# Patient Record
Sex: Female | Born: 1940
Health system: Southern US, Community
[De-identification: ages and names within clinical notes are randomized; demographics above are authoritative.]

## PROBLEM LIST (undated history)

## (undated) DIAGNOSIS — N189 Chronic kidney disease, unspecified: Secondary | ICD-10-CM

## (undated) DIAGNOSIS — R011 Cardiac murmur, unspecified: Secondary | ICD-10-CM

## (undated) DIAGNOSIS — I5032 Chronic diastolic (congestive) heart failure: Secondary | ICD-10-CM

## (undated) DIAGNOSIS — I440 Atrioventricular block, first degree: Secondary | ICD-10-CM

## (undated) DIAGNOSIS — R001 Bradycardia, unspecified: Secondary | ICD-10-CM

## (undated) DIAGNOSIS — I10 Essential (primary) hypertension: Secondary | ICD-10-CM

## (undated) DIAGNOSIS — M1990 Unspecified osteoarthritis, unspecified site: Secondary | ICD-10-CM

## (undated) DIAGNOSIS — I480 Paroxysmal atrial fibrillation: Secondary | ICD-10-CM

## (undated) DIAGNOSIS — I495 Sick sinus syndrome: Secondary | ICD-10-CM

## (undated) DIAGNOSIS — T7840XA Allergy, unspecified, initial encounter: Secondary | ICD-10-CM

## (undated) DIAGNOSIS — I209 Angina pectoris, unspecified: Secondary | ICD-10-CM

## (undated) DIAGNOSIS — E785 Hyperlipidemia, unspecified: Secondary | ICD-10-CM

## (undated) DIAGNOSIS — Z950 Presence of cardiac pacemaker: Secondary | ICD-10-CM

## (undated) DIAGNOSIS — M810 Age-related osteoporosis without current pathological fracture: Secondary | ICD-10-CM

## (undated) DIAGNOSIS — K219 Gastro-esophageal reflux disease without esophagitis: Secondary | ICD-10-CM

## (undated) DIAGNOSIS — Z7901 Long term (current) use of anticoagulants: Secondary | ICD-10-CM

## (undated) DIAGNOSIS — I447 Left bundle-branch block, unspecified: Secondary | ICD-10-CM

## (undated) HISTORY — PX: TONSILLECTOMY AND ADENOIDECTOMY: SUR1326

## (undated) HISTORY — DX: Chronic kidney disease, unspecified: N18.9

## (undated) HISTORY — PX: BREAST LUMPECTOMY: SHX2

## (undated) HISTORY — DX: Left bundle-branch block, unspecified: I44.7

## (undated) HISTORY — DX: Age-related osteoporosis without current pathological fracture: M81.0

## (undated) HISTORY — DX: Essential (primary) hypertension: I10

## (undated) HISTORY — DX: Sick sinus syndrome: I49.5

## (undated) HISTORY — DX: Hyperlipidemia, unspecified: E78.5

## (undated) HISTORY — DX: Bradycardia, unspecified: R00.1

## (undated) HISTORY — PX: VAGINAL HYSTERECTOMY: SUR661

## (undated) HISTORY — DX: Long term (current) use of anticoagulants: Z79.01

## (undated) HISTORY — PX: BREAST EXCISIONAL BIOPSY: SUR124

## (undated) HISTORY — DX: Atrioventricular block, first degree: I44.0

## (undated) HISTORY — DX: Allergy, unspecified, initial encounter: T78.40XA

---

## 1969-01-08 HISTORY — PX: TUBAL LIGATION: SHX77

## 1969-01-08 HISTORY — PX: APPENDECTOMY: SHX54

## 1997-06-08 ENCOUNTER — Emergency Department (HOSPITAL_COMMUNITY): Admission: EM | Admit: 1997-06-08 | Discharge: 1997-06-08 | Payer: Self-pay | Admitting: Emergency Medicine

## 1997-08-03 ENCOUNTER — Other Ambulatory Visit: Admission: RE | Admit: 1997-08-03 | Discharge: 1997-08-03 | Payer: Self-pay | Admitting: Obstetrics and Gynecology

## 1999-02-23 ENCOUNTER — Other Ambulatory Visit: Admission: RE | Admit: 1999-02-23 | Discharge: 1999-02-23 | Payer: Self-pay | Admitting: Obstetrics and Gynecology

## 2000-06-12 ENCOUNTER — Other Ambulatory Visit: Admission: RE | Admit: 2000-06-12 | Discharge: 2000-06-12 | Payer: Self-pay | Admitting: Obstetrics and Gynecology

## 2000-11-09 ENCOUNTER — Emergency Department (HOSPITAL_COMMUNITY): Admission: EM | Admit: 2000-11-09 | Discharge: 2000-11-09 | Payer: Self-pay | Admitting: Emergency Medicine

## 2000-11-15 ENCOUNTER — Emergency Department (HOSPITAL_COMMUNITY): Admission: EM | Admit: 2000-11-15 | Discharge: 2000-11-15 | Payer: Self-pay | Admitting: Emergency Medicine

## 2001-10-10 ENCOUNTER — Other Ambulatory Visit: Admission: RE | Admit: 2001-10-10 | Discharge: 2001-10-10 | Payer: Self-pay | Admitting: Obstetrics and Gynecology

## 2003-12-27 ENCOUNTER — Encounter: Admission: RE | Admit: 2003-12-27 | Discharge: 2003-12-27 | Payer: Self-pay | Admitting: Obstetrics and Gynecology

## 2004-11-10 ENCOUNTER — Emergency Department (HOSPITAL_COMMUNITY): Admission: EM | Admit: 2004-11-10 | Discharge: 2004-11-10 | Payer: Self-pay | Admitting: Emergency Medicine

## 2005-02-01 ENCOUNTER — Encounter: Admission: RE | Admit: 2005-02-01 | Discharge: 2005-02-01 | Payer: Self-pay | Admitting: Obstetrics and Gynecology

## 2005-02-22 ENCOUNTER — Encounter: Admission: RE | Admit: 2005-02-22 | Discharge: 2005-02-22 | Payer: Self-pay | Admitting: Obstetrics and Gynecology

## 2006-06-06 ENCOUNTER — Encounter: Admission: RE | Admit: 2006-06-06 | Discharge: 2006-06-06 | Payer: Self-pay | Admitting: Obstetrics and Gynecology

## 2007-07-04 ENCOUNTER — Encounter: Admission: RE | Admit: 2007-07-04 | Discharge: 2007-07-04 | Payer: Self-pay | Admitting: Obstetrics and Gynecology

## 2007-07-21 ENCOUNTER — Encounter: Admission: RE | Admit: 2007-07-21 | Discharge: 2007-07-21 | Payer: Self-pay | Admitting: Obstetrics and Gynecology

## 2008-07-09 ENCOUNTER — Encounter: Admission: RE | Admit: 2008-07-09 | Discharge: 2008-07-09 | Payer: Self-pay | Admitting: Obstetrics and Gynecology

## 2009-07-12 ENCOUNTER — Encounter: Admission: RE | Admit: 2009-07-12 | Discharge: 2009-07-12 | Payer: Self-pay | Admitting: Obstetrics and Gynecology

## 2009-08-05 HISTORY — PX: NM MYOCAR PERF WALL MOTION: HXRAD629

## 2010-01-29 ENCOUNTER — Encounter: Payer: Self-pay | Admitting: Obstetrics and Gynecology

## 2010-01-30 ENCOUNTER — Encounter: Payer: Self-pay | Admitting: Obstetrics and Gynecology

## 2010-07-31 ENCOUNTER — Other Ambulatory Visit: Payer: Self-pay | Admitting: Obstetrics and Gynecology

## 2010-07-31 DIAGNOSIS — Z1231 Encounter for screening mammogram for malignant neoplasm of breast: Secondary | ICD-10-CM

## 2010-08-02 ENCOUNTER — Ambulatory Visit
Admission: RE | Admit: 2010-08-02 | Discharge: 2010-08-02 | Disposition: A | Discharge status: Home / Self Care | Payer: Federal, State, Local not specified - PPO | Source: Ambulatory Visit | Attending: Obstetrics and Gynecology | Admitting: Obstetrics and Gynecology

## 2010-08-02 DIAGNOSIS — Z1231 Encounter for screening mammogram for malignant neoplasm of breast: Secondary | ICD-10-CM

## 2011-07-26 ENCOUNTER — Other Ambulatory Visit: Payer: Self-pay | Admitting: Obstetrics and Gynecology

## 2011-07-26 DIAGNOSIS — Z1231 Encounter for screening mammogram for malignant neoplasm of breast: Secondary | ICD-10-CM

## 2011-08-06 ENCOUNTER — Ambulatory Visit
Admission: RE | Admit: 2011-08-06 | Discharge: 2011-08-06 | Disposition: A | Discharge status: Home / Self Care | Payer: Federal, State, Local not specified - PPO | Source: Ambulatory Visit | Attending: Obstetrics and Gynecology | Admitting: Obstetrics and Gynecology

## 2011-08-06 DIAGNOSIS — Z1231 Encounter for screening mammogram for malignant neoplasm of breast: Secondary | ICD-10-CM

## 2012-05-02 ENCOUNTER — Telehealth (HOSPITAL_COMMUNITY): Payer: Self-pay | Admitting: *Deleted

## 2012-05-09 ENCOUNTER — Other Ambulatory Visit (HOSPITAL_COMMUNITY): Payer: Self-pay | Admitting: Cardiovascular Disease

## 2012-05-09 DIAGNOSIS — I4891 Unspecified atrial fibrillation: Secondary | ICD-10-CM

## 2012-05-13 ENCOUNTER — Ambulatory Visit (HOSPITAL_COMMUNITY)
Admission: RE | Admit: 2012-05-13 | Discharge: 2012-05-13 | Disposition: A | Discharge status: Home / Self Care | Payer: Medicare Other | Source: Ambulatory Visit | Attending: Cardiovascular Disease | Admitting: Cardiovascular Disease

## 2012-05-13 DIAGNOSIS — I4891 Unspecified atrial fibrillation: Secondary | ICD-10-CM

## 2012-05-13 NOTE — Progress Notes (Signed)
2D Echo Performed 05/13/2012    Martin Belling, RCS  

## 2012-05-27 ENCOUNTER — Other Ambulatory Visit: Payer: Self-pay | Admitting: Cardiovascular Disease

## 2012-05-27 ENCOUNTER — Encounter: Payer: Self-pay | Admitting: Cardiovascular Disease

## 2012-05-27 LAB — LIPID PANEL
Cholesterol: 169 mg/dL (ref 0–200)
HDL: 57 mg/dL (ref >39)
LDL Cholesterol: 88 mg/dL (ref 0–99)
Total CHOL/HDL Ratio: 3 Ratio
Triglycerides: 121 mg/dL (ref <150)
VLDL: 24 mg/dL (ref 0–40)

## 2012-05-27 LAB — CBC WITH DIFFERENTIAL/PLATELET
Basophils Absolute: 0 10*3/uL (ref 0.0–0.1)
Basophils Relative: 0 % (ref 0–1)
Eosinophils Absolute: 0.2 10*3/uL (ref 0.0–0.7)
Eosinophils Relative: 3 % (ref 0–5)
HCT: 40.4 % (ref 36.0–46.0)
Hemoglobin: 13.7 g/dL (ref 12.0–15.0)
Lymphocytes Relative: 32 % (ref 12–46)
Lymphs Abs: 2.1 10*3/uL (ref 0.7–4.0)
MCH: 31.1 pg (ref 26.0–34.0)
MCHC: 33.9 g/dL (ref 30.0–36.0)
MCV: 91.8 fL (ref 78.0–100.0)
Monocytes Absolute: 0.6 10*3/uL (ref 0.1–1.0)
Monocytes Relative: 10 % (ref 3–12)
Neutro Abs: 3.6 10*3/uL (ref 1.7–7.7)
Neutrophils Relative %: 55 % (ref 43–77)
Platelets: 274 10*3/uL (ref 150–400)
RBC: 4.4 MIL/uL (ref 3.87–5.11)
RDW: 13.8 % (ref 11.5–15.5)
WBC: 6.5 10*3/uL (ref 4.0–10.5)

## 2012-05-27 LAB — COMPREHENSIVE METABOLIC PANEL
ALT: 37 U/L — ABNORMAL HIGH (ref 0–35)
AST: 31 U/L (ref 0–37)
Albumin: 4.5 g/dL (ref 3.5–5.2)
Alkaline Phosphatase: 123 U/L — ABNORMAL HIGH (ref 39–117)
BUN: 20 mg/dL (ref 6–23)
CO2: 29 mEq/L (ref 19–32)
Calcium: 10.1 mg/dL (ref 8.4–10.5)
Chloride: 103 mEq/L (ref 96–112)
Creat: 1.1 mg/dL (ref 0.50–1.10)
Glucose, Bld: 77 mg/dL (ref 70–99)
Potassium: 4.6 mEq/L (ref 3.5–5.3)
Sodium: 141 mEq/L (ref 135–145)
Total Bilirubin: 0.5 mg/dL (ref 0.3–1.2)
Total Protein: 6.7 g/dL (ref 6.0–8.3)

## 2012-07-09 ENCOUNTER — Other Ambulatory Visit: Payer: Self-pay

## 2012-07-09 DIAGNOSIS — Z1231 Encounter for screening mammogram for malignant neoplasm of breast: Secondary | ICD-10-CM

## 2012-08-06 ENCOUNTER — Ambulatory Visit
Admission: RE | Admit: 2012-08-06 | Discharge: 2012-08-06 | Disposition: A | Discharge status: Home / Self Care | Payer: Medicare Other | Source: Ambulatory Visit

## 2012-08-06 DIAGNOSIS — Z1231 Encounter for screening mammogram for malignant neoplasm of breast: Secondary | ICD-10-CM

## 2012-11-27 ENCOUNTER — Telehealth: Payer: Self-pay | Admitting: Cardiovascular Disease

## 2012-11-27 ENCOUNTER — Other Ambulatory Visit: Payer: Self-pay

## 2012-11-27 MED ORDER — FLECAINIDE ACETATE 100 MG PO TABS: 100.0000 mg | ORAL_TABLET | Freq: Two times a day (BID) | ORAL | Status: DC

## 2012-11-27 NOTE — Telephone Encounter (Signed)
Returned call and pt verified x 2.  Need refill on flecainide.  Stated she uses [facility].  Refill(s) sent to pharmacy.  Appt scheduled for 12.12.14 at 8:30am w/ Dr. Royann Shivers (per pt request) as pt due for 6 mos f/u and is switching from Dr. Alanda Amass.  Pt will get refills for other meds at visit.  Pt verbalized understanding and agreed w/ plan.

## 2012-11-27 NOTE — Telephone Encounter (Signed)
Bystolic 10 mg tablets are currently on back order. Pharmacy - [facility] requesting to use Bystolic 5 mg adjusted to the same dose. Okay per Phillips Hay, PharmD. Request faxed back to pharmacy.

## 2012-11-27 NOTE — Telephone Encounter (Signed)
Message per answering service. She gets her medicine through the mail,needs you to contact them and give them new doctor information.

## 2012-12-18 ENCOUNTER — Encounter: Payer: Self-pay | Admitting: *Deleted

## 2012-12-19 ENCOUNTER — Ambulatory Visit: Payer: Medicare Other | Admitting: Cardiovascular Disease

## 2012-12-23 ENCOUNTER — Encounter: Payer: Self-pay | Admitting: Cardiovascular Disease

## 2012-12-23 ENCOUNTER — Ambulatory Visit (INDEPENDENT_AMBULATORY_CARE_PROVIDER_SITE_OTHER): Payer: Medicare Other | Admitting: Cardiovascular Disease

## 2012-12-23 ENCOUNTER — Encounter (HOSPITAL_COMMUNITY): Payer: Self-pay | Admitting: Pharmacy Technician

## 2012-12-23 VITALS — BP 138/80 | HR 48 | Resp 16 | Ht 62.0 in | Wt 154.7 lb

## 2012-12-23 DIAGNOSIS — Z79899 Other long term (current) drug therapy: Secondary | ICD-10-CM

## 2012-12-23 DIAGNOSIS — I447 Left bundle-branch block, unspecified: Secondary | ICD-10-CM

## 2012-12-23 DIAGNOSIS — D689 Coagulation defect, unspecified: Secondary | ICD-10-CM

## 2012-12-23 DIAGNOSIS — I4891 Unspecified atrial fibrillation: Secondary | ICD-10-CM

## 2012-12-23 DIAGNOSIS — I480 Paroxysmal atrial fibrillation: Secondary | ICD-10-CM

## 2012-12-23 DIAGNOSIS — I10 Essential (primary) hypertension: Secondary | ICD-10-CM

## 2012-12-23 DIAGNOSIS — I440 Atrioventricular block, first degree: Secondary | ICD-10-CM

## 2012-12-23 DIAGNOSIS — I498 Other specified cardiac arrhythmias: Secondary | ICD-10-CM

## 2012-12-23 DIAGNOSIS — E785 Hyperlipidemia, unspecified: Secondary | ICD-10-CM

## 2012-12-23 DIAGNOSIS — R001 Bradycardia, unspecified: Secondary | ICD-10-CM

## 2012-12-23 DIAGNOSIS — R5381 Other malaise: Secondary | ICD-10-CM

## 2012-12-23 LAB — CBC
HCT: 40.9 % (ref 36.0–46.0)
Hemoglobin: 13.7 g/dL (ref 12.0–15.0)
MCH: 31.2 pg (ref 26.0–34.0)
MCHC: 33.5 g/dL (ref 30.0–36.0)
MCV: 93.2 fL (ref 78.0–100.0)
Platelets: 315 10*3/uL (ref 150–400)
RBC: 4.39 MIL/uL (ref 3.87–5.11)
WBC: 6.4 10*3/uL (ref 4.0–10.5)

## 2012-12-23 NOTE — Patient Instructions (Signed)
Your physician has recommended that you have a pacemaker inserted.  (MEDTRONIC DUAL CHAMBER). A pacemaker is a small device that is placed under the skin of your chest or abdomen to help control abnormal heart rhythms. This device uses electrical pulses to prompt the heart to beat at a normal rate. Pacemakers are used to treat heart rhythms that are too slow. Wire (leads) are attached to the pacemaker that goes into the chambers of you heart. This is done in the hospital and usually requires and overnight stay. Please see the instruction sheet given to you today for more information.  You will need to have lab work done within the week  prior to this procedure.

## 2012-12-23 NOTE — Assessment & Plan Note (Signed)
Good control

## 2012-12-23 NOTE — Progress Notes (Signed)
Patient ID: Monica Harvey, female   DOB: 1940-06-23, 72 y.o.   MRN: [id number]      Reason for office visit Paroxysmal atrial fibrillation, fatigue  I met Monica Harvey once before when I performed a pacemaker generator change out on her son Monica Harvey. She is here to establish new cardiology followup after Dr. Susa Griffins is retirement  Monica Harvey complains of worsening fatigue and reduction in ability to perform day-to-day activities. She now has this done exertion, functional class II. She denies dizziness or syncope. She finds that she does have a tendency to lean over to one side and lose her balance. She has not had problems with exertional angina. She has a long-standing history of paroxysmal atrial fibrillation that has responded very well to treatment with flecainide. She has occasional isolated palpitations but has not had any sustained arrhythmia since her last appointment with Dr. Alanda Amass. She takes a beta blocker for both ventricular rate control and hypertension.  She has never had problems with coronary disease or congestive heart failure. She does have treated hyperlipidemia and hypertension and had a normal nuclear stress test in 2011 (left bundle branch block related artifact, no reversible ischemia). By nuclear scintigraphy her estimated ejection fraction is 59%, by echocardiography her EF is 45-50%, the reduction being attributable to left bundle branch block related asynchrony.   Allergies  Allergen Reactions  . Codeine     Current Outpatient Prescriptions  Medication Sig Dispense Refill  . amitriptyline (ELAVIL) 25 MG tablet Take 25 mg by mouth at bedtime.      Marland Kitchen aspirin 162 MG EC tablet Take 162 mg by mouth daily.      . flecainide (TAMBOCOR) 100 MG tablet Take 1 tablet (100 mg total) by mouth 2 (two) times daily.  180 tablet  3  . nebivolol (BYSTOLIC) 10 MG tablet Take 10 mg by mouth daily.      . Pitavastatin Calcium (LIVALO) 1 MG TABS Take 1 tablet by mouth  daily.      . valsartan (DIOVAN) 160 MG tablet Take 160 mg by mouth daily.       No current facility-administered medications for this visit.    Past Medical History  Diagnosis Date  . SSS (sick sinus syndrome)     H/O  . Atrial fibrillation     remote H/O  . LBBB (left bundle branch block)   . Hyperlipidemia   . Symptomatic sinus bradycardia 12/23/2012  . First degree atrioventricular block 12/23/2012  . HTN (hypertension) 12/23/2012    Past Surgical History  Procedure Laterality Date  . Nm myocar perf wall motion  08/05/2009    small to mod. reversible anteroseptal,septal,& apical perfusion defect. EF 59%.    Family History  Problem Relation Age of Onset  . Heart attack Mother   . Stroke Sister     History   Social History  . Marital Status: Married    Spouse Name: N/A    Number of Children: N/A  . Years of Education: N/A   Occupational History  . Not on file.   Social History Main Topics  . Smoking status: Never Smoker   . Smokeless tobacco: Not on file  . Alcohol Use: No  . Drug Use: No  . Sexual Activity: Not on file   Other Topics Concern  . Not on file   Social History Narrative  . No narrative on file    Review of systems: The patient specifically denies any chest pain at rest  or with exertion, dyspnea at rest, orthopnea, paroxysmal nocturnal dyspnea, syncope, palpitations, focal neurological deficits, intermittent claudication, lower extremity edema, unexplained weight gain, cough, hemoptysis or wheezing.  The patient also denies abdominal pain, nausea, vomiting, dysphagia, diarrhea, constipation, polyuria, polydipsia, dysuria, hematuria, frequency, urgency, abnormal bleeding or bruising, fever, chills, unexpected weight changes, mood swings, change in skin or hair texture, change in voice quality, auditory or visual problems, allergic reactions or rashes, new musculoskeletal complaints other than usual "aches and pains".   PHYSICAL EXAM BP  138/80  Pulse 48  Resp 16  Ht 5\' 2"  (1.575 m)  Wt 154 lb 11.2 oz (70.171 kg)  BMI 28.29 kg/m2  General: Alert, oriented x3, no distress Head: no evidence of trauma, PERRL, EOMI, no exophtalmos or lid lag, no myxedema, no xanthelasma; normal ears, nose and oropharynx Neck: normal jugular venous pulsations and no hepatojugular reflux; brisk carotid pulses without delay and no carotid bruits Chest: clear to auscultation, no signs of consolidation by percussion or palpation, normal fremitus, symmetrical and full respiratory excursions Cardiovascular: normal position and quality of the apical impulse, regular rhythm, normal first and paradoxically split second heart sounds, no murmurs, rubs or gallops Abdomen: no tenderness or distention, no masses by palpation, no abnormal pulsatility or arterial bruits, normal bowel sounds, no hepatosplenomegaly Extremities: no clubbing, cyanosis or edema; 2+ radial, ulnar and brachial pulses bilaterally; 2+ right femoral, posterior tibial and dorsalis pedis pulses; 2+ left femoral, posterior tibial and dorsalis pedis pulses; no subclavian or femoral bruits Neurological: grossly nonfocal   EKG: Marked sinus bradycardia, first degree AV block, left bundle branch block  Lipid Panel     Component Value Date/Time   CHOL 169 05/27/2012 0908   TRIG 121 05/27/2012 0908   HDL 57 05/27/2012 0908   CHOLHDL 3.0 05/27/2012 0908   VLDL 24 05/27/2012 0908   LDLCALC 88 05/27/2012 0908    BMET    Component Value Date/Time   NA 141 05/27/2012 0908   K 4.6 05/27/2012 0908   CL 103 05/27/2012 0908   CO2 29 05/27/2012 0908   GLUCOSE 77 05/27/2012 0908   BUN 20 05/27/2012 0908   CREATININE 1.10 05/27/2012 0908   CALCIUM 10.1 05/27/2012 0908     ASSESSMENT AND PLAN Symptomatic sinus bradycardia She has never had syncope, but is experiencing worsening dyspnea on exertion and sometimes extreme fatigue. She needs to continue taking beta blockers while she takes flecainide for  atrial fibrillation. She will benefit from implantation of a dual-chamber permanent pacemaker. She does not have clear indications for MRI in the future, but may benefit from the atrial tachycardia therapies provided by a Medtronic Advisa device. She also has evidence of AV conduction disease with left bundle branch block and first degree AV block, at risk of developing complete heart block. At this point in time she does not have overt congestive heart failure and has at most minimally depressed left ventricular systolic function. Resynchronization pacing does not appear to be indicated at this time. This procedure has been fully reviewed with the patient and informed consent has been obtained. She is familiar with pacemakers and the implantation procedure from her son Channing Mutters "Monica Harvey" Stone.   PAF (paroxysmal atrial fibrillation) Flecainide has provided good at arrhythmia suppression, but she is getting older and has at least one risk factor for vascular disease (hyperlipidemia) it is important to periodically reassess for coronary disease. She has not had a functional study since 2011. We'll probably wait for another year or 2  before reassessing, but I instructed her to call me right away if should have any type of chest discomfort, especially if it occurs with activity.  HTN (hypertension) Good control  Hyperlipidemia All parameters are within the desirable range on a very low dose of pitavastatin.   Orders Placed This Encounter  Procedures  . CBC  . Comprehensive metabolic panel  . INR/PT  . PTT  . EKG 12-Lead  . PERMANENT PACEMAKER INSERTION   Junious Silk, MD, [facility] [phone] office [phone] pager

## 2012-12-23 NOTE — Assessment & Plan Note (Signed)
Flecainide has provided good at arrhythmia suppression, but she is getting older and has at least one risk factor for vascular disease (hyperlipidemia) it is important to periodically reassess for coronary disease. She has not had a functional study since 2011. We'll probably wait for another year or 2 before reassessing, but I instructed her to call me right away if should have any type of chest discomfort, especially if it occurs with activity.

## 2012-12-23 NOTE — Assessment & Plan Note (Signed)
All parameters are within the desirable range on a very low dose of pitavastatin.

## 2012-12-23 NOTE — Assessment & Plan Note (Addendum)
She has never had syncope, but is experiencing worsening dyspnea on exertion and sometimes extreme fatigue. She needs to continue taking beta blockers while she takes flecainide for atrial fibrillation. She will benefit from implantation of a dual-chamber permanent pacemaker. She does not have clear indications for MRI in the future, but may benefit from the atrial tachycardia therapies provided by a Medtronic Advisa device. She also has evidence of AV conduction disease with left bundle branch block and first degree AV block, at risk of developing complete heart block. At this point in time she does not have overt congestive heart failure and has at most minimally depressed left ventricular systolic function. Resynchronization pacing does not appear to be indicated at this time. This procedure has been fully reviewed with the patient and informed consent has been obtained. She is familiar with pacemakers and the implantation procedure from her son Monica Harvey.

## 2012-12-24 ENCOUNTER — Other Ambulatory Visit: Payer: Self-pay | Admitting: *Deleted

## 2012-12-24 DIAGNOSIS — R001 Bradycardia, unspecified: Secondary | ICD-10-CM

## 2012-12-24 LAB — COMPREHENSIVE METABOLIC PANEL
ALT: 33 U/L (ref 0–35)
Albumin: 4.5 g/dL (ref 3.5–5.2)
Alkaline Phosphatase: 119 U/L — ABNORMAL HIGH (ref 39–117)
BUN: 13 mg/dL (ref 6–23)
CO2: 26 mEq/L (ref 19–32)
Calcium: 10.3 mg/dL (ref 8.4–10.5)
Chloride: 104 mEq/L (ref 96–112)
Creat: 1.12 mg/dL — ABNORMAL HIGH (ref 0.50–1.10)
Glucose, Bld: 94 mg/dL (ref 70–99)
Potassium: 4.6 mEq/L (ref 3.5–5.3)
Sodium: 143 mEq/L (ref 135–145)
Total Bilirubin: 0.4 mg/dL (ref 0.3–1.2)

## 2012-12-24 LAB — APTT: aPTT: 27 seconds (ref 24–37)

## 2012-12-24 LAB — PROTIME-INR: INR: 0.88 (ref <1.50)

## 2012-12-25 MED ORDER — SODIUM CHLORIDE 0.9 % IR SOLN
80.0000 mg | Status: DC
  Filled 2012-12-25: qty 2

## 2012-12-25 MED ORDER — CEFAZOLIN SODIUM-DEXTROSE 2-3 GM-% IV SOLR
2.0000 g | INTRAVENOUS | Status: DC
  Filled 2012-12-25: qty 50

## 2012-12-25 MED ORDER — SODIUM CHLORIDE 0.9 % IV SOLN
INTRAVENOUS | Status: DC
  Administered 2012-12-26: 50 mL/h via INTRAVENOUS

## 2012-12-25 MED ORDER — SODIUM CHLORIDE 0.9 % IJ SOLN: 3.0000 mL | INTRAMUSCULAR | Status: DC | PRN

## 2012-12-26 ENCOUNTER — Encounter (HOSPITAL_COMMUNITY): Payer: Self-pay | Admitting: *Deleted

## 2012-12-26 ENCOUNTER — Encounter (HOSPITAL_COMMUNITY)
Admission: RE | Disposition: A | Discharge status: Home / Self Care | Payer: Self-pay | Source: Ambulatory Visit | Attending: Cardiovascular Disease

## 2012-12-26 ENCOUNTER — Ambulatory Visit (HOSPITAL_COMMUNITY)
Admission: RE | Admit: 2012-12-26 | Discharge: 2012-12-27 | Disposition: A | Discharge status: Home / Self Care | Payer: Medicare Other | Source: Ambulatory Visit | Attending: Cardiovascular Disease | Admitting: Cardiovascular Disease

## 2012-12-26 DIAGNOSIS — I447 Left bundle-branch block, unspecified: Secondary | ICD-10-CM | POA: Diagnosis present

## 2012-12-26 DIAGNOSIS — R001 Bradycardia, unspecified: Secondary | ICD-10-CM | POA: Diagnosis present

## 2012-12-26 DIAGNOSIS — I10 Essential (primary) hypertension: Secondary | ICD-10-CM | POA: Diagnosis present

## 2012-12-26 DIAGNOSIS — I495 Sick sinus syndrome: Secondary | ICD-10-CM

## 2012-12-26 DIAGNOSIS — I498 Other specified cardiac arrhythmias: Secondary | ICD-10-CM

## 2012-12-26 DIAGNOSIS — Z7982 Long term (current) use of aspirin: Secondary | ICD-10-CM | POA: Insufficient documentation

## 2012-12-26 DIAGNOSIS — I440 Atrioventricular block, first degree: Secondary | ICD-10-CM | POA: Diagnosis present

## 2012-12-26 DIAGNOSIS — I4891 Unspecified atrial fibrillation: Secondary | ICD-10-CM | POA: Insufficient documentation

## 2012-12-26 DIAGNOSIS — Z950 Presence of cardiac pacemaker: Secondary | ICD-10-CM | POA: Diagnosis present

## 2012-12-26 DIAGNOSIS — E785 Hyperlipidemia, unspecified: Secondary | ICD-10-CM | POA: Diagnosis present

## 2012-12-26 DIAGNOSIS — I480 Paroxysmal atrial fibrillation: Secondary | ICD-10-CM | POA: Diagnosis present

## 2012-12-26 HISTORY — PX: INSERT / REPLACE / REMOVE PACEMAKER: SUR710

## 2012-12-26 HISTORY — PX: PERMANENT PACEMAKER INSERTION: SHX5480

## 2012-12-26 LAB — SURGICAL PCR SCREEN
MRSA, PCR: NEGATIVE
Staphylococcus aureus: NEGATIVE

## 2012-12-26 SURGERY — PERMANENT PACEMAKER INSERTION
Anesthesia: LOCAL

## 2012-12-26 MED ORDER — ASPIRIN EC 81 MG PO TBEC
162.0000 mg | DELAYED_RELEASE_TABLET | Freq: Every day | ORAL | Status: DC
  Administered 2012-12-26 – 2012-12-27 (×2): 162 mg via ORAL
  Filled 2012-12-26 (×2): qty 2

## 2012-12-26 MED ORDER — LIDOCAINE HCL (PF) 1 % IJ SOLN
INTRAMUSCULAR | Status: AC
  Filled 2012-12-26: qty 60

## 2012-12-26 MED ORDER — IRBESARTAN 150 MG PO TABS
150.0000 mg | ORAL_TABLET | Freq: Every day | ORAL | Status: DC
  Administered 2012-12-27: 150 mg via ORAL
  Filled 2012-12-26 (×2): qty 1

## 2012-12-26 MED ORDER — MIDAZOLAM HCL 2 MG/2ML IJ SOLN
INTRAMUSCULAR | Status: AC
  Filled 2012-12-26: qty 2

## 2012-12-26 MED ORDER — HEPARIN (PORCINE) IN NACL 2-0.9 UNIT/ML-% IJ SOLN
INTRAMUSCULAR | Status: AC
  Filled 2012-12-26: qty 500

## 2012-12-26 MED ORDER — CEFAZOLIN SODIUM 1-5 GM-% IV SOLN
1.0000 g | Freq: Four times a day (QID) | INTRAVENOUS | Status: AC
  Administered 2012-12-26 – 2012-12-27 (×3): 1 g via INTRAVENOUS
  Filled 2012-12-26 (×3): qty 50

## 2012-12-26 MED ORDER — FENTANYL CITRATE 0.05 MG/ML IJ SOLN
INTRAMUSCULAR | Status: AC
  Filled 2012-12-26: qty 2

## 2012-12-26 MED ORDER — NEBIVOLOL HCL 10 MG PO TABS
10.0000 mg | ORAL_TABLET | Freq: Every day | ORAL | Status: DC
  Administered 2012-12-26 – 2012-12-27 (×2): 10 mg via ORAL
  Filled 2012-12-26 (×2): qty 1

## 2012-12-26 MED ORDER — ONDANSETRON HCL 4 MG/2ML IJ SOLN: 4.0000 mg | Freq: Four times a day (QID) | INTRAMUSCULAR | Status: DC | PRN

## 2012-12-26 MED ORDER — SODIUM CHLORIDE 0.9 % IV SOLN
INTRAVENOUS | Status: AC
  Administered 2012-12-26: 13:00:00 via INTRAVENOUS

## 2012-12-26 MED ORDER — TRAMADOL-ACETAMINOPHEN 37.5-325 MG PO TABS
1.0000 | ORAL_TABLET | Freq: Four times a day (QID) | ORAL | Status: DC | PRN
  Administered 2012-12-26 (×2): 2 via ORAL
  Filled 2012-12-26 (×2): qty 2

## 2012-12-26 MED ORDER — AMITRIPTYLINE HCL 25 MG PO TABS
25.0000 mg | ORAL_TABLET | Freq: Every day | ORAL | Status: DC
  Administered 2012-12-26: 25 mg via ORAL
  Filled 2012-12-26 (×2): qty 1

## 2012-12-26 MED ORDER — MUPIROCIN 2 % EX OINT
TOPICAL_OINTMENT | Freq: Two times a day (BID) | CUTANEOUS | Status: DC
  Administered 2012-12-26: 1 via NASAL
  Filled 2012-12-26 (×2): qty 22

## 2012-12-26 MED ORDER — ACETAMINOPHEN 325 MG PO TABS: 325.0000 mg | ORAL_TABLET | ORAL | Status: DC | PRN

## 2012-12-26 NOTE — CV Procedure (Signed)
Procedure report  Procedure performed:  1. Implantation of new dual chamber permanent pacemaker 2. Fluoroscopy 3. Light sedation  Reason for procedure: Symptomatic bradycardia due to: Sinus node dysfunction Tachycardia-bradycardia syndrome Bradycardia due to necessary medications Paroxysmal atrial fibrillation  Procedure performed by: Thurmon Fair, MD  Complications: None  Estimated blood loss: <10 mL  Medications administered during procedure: Ancef 1 g intravenously Lidocaine 1% 30 mL locally,  Fentanyl 75 mcg intravenously Versed 3 mg intravenously  Device details: Generator Medtronic Advisa model [id number] serial number [id number] H Right atrial lead Medtronic [id number] serial number [id number] Right ventricular lead Medtronic [id number] serial number [id number]  Procedure details:  After the risks and benefits of the procedure were discussed the patient provided informed consent and was brought to the cardiac cath lab in the fasting state. The patient was prepped and draped in usual sterile fashion. Local anesthesia with 1% lidocaine was administered to to the left infraclavicular area. A 5-6 cm horizontal incision was made parallel with and 2-3 cm caudal to the left clavicle. Using electrocautery and blunt dissection a prepectoral pocket was created down to the level of the pectoralis major muscle fascia. The pocket was carefully inspected for hemostasis. An antibiotic-soaked sponge was placed in the pocket.  Under fluoroscopic guidance and using the modified Seldinger technique 2 separate venipunctures were performed to access the left subclavian vein. No difficulty was encountered accessing the vein.  Two J-tip guidewires were subsequently exchanged for two 7 French safe sheaths.  Under fluoroscopic guidance the ventricular lead was advanced to level of the mid to apical right ventricular septum and thet active-fixation helix was deployed. Prominent current of injury was  seen. Satisfactory pacing and sensing parameters were recorded. There was no evidence of diaphragmatic stimulation at maximum device output. The safe sheath was peeled away and the lead was secured in place with 2-0 silk.  In similar fashion the right atrial lead was advanced to the level of the atrial appendage. The active-fixation helix was deployed. There was prominent current of injury. Satisfactory  sensing parameters were recorded, but there was a lot of difficulty finding a location with good pacing thresholds. The lead was deployed in several different locations and despite allowing time for initial injury to dissipate, there was still a fairly high pacing threshold .There was no evidence of diaphragmatic stimulation with pacing at maximum device output. The safe sheath was peeled away and the lead was secured in place with 2-0 silk.  The antibiotic-soaked sponge was removed from the pocket. The pocket was flushed with copious amounts of antibiotic solution. Reinspection showed excellent hemostasis..  The ventricular lead was connected to the generator and appropriate ventricular pacing was seen. Subsequently the atrial lead was also connected. Repeat testing of the lead parameters later showed excellent values.  The entire system was then carefully inserted in the pocket with care been taking that the leads and device assumed a comfortable position without pressure on the incision. Great care was taken that the leads be located deep to the generator. The pocket was then closed in layers using 2 layers of 2-0 Vicryl and cutaneous staples, after which a sterile dressing was applied.  At the end of the procedure the following lead parameters were encountered:  Right atrial lead  sensed P waves 1.5 mV, impedance 597 ohms, threshold 2.25 V at 0.8 ms pulse width.  Right ventricular lead sensed R waves 10.5 mV, impedance 963 ohms, threshold 1.2 V at 0.5 ms pulse width.  Will discontinue flecainide  and if needed for AF prevention will prefer to use sotalol for better pacing thresholds.  Thurmon Fair, MD, [facility] [phone] office [phone] pager

## 2012-12-26 NOTE — H&P (View-Only) (Signed)
Patient ID: Monica Harvey, female   DOB: 04/19/1940, 72 y.o.   MRN: [id number]      Reason for office visit Paroxysmal atrial fibrillation, fatigue  I met Monica Harvey once before when I performed a pacemaker generator change out on her son Shane. She is here to establish new cardiology followup after Dr. Richard Weintraub is retirement  Monica Harvey complains of worsening fatigue and reduction in ability to perform day-to-day activities. She now has this done exertion, functional class II. She denies dizziness or syncope. She finds that she does have a tendency to lean over to one side and lose her balance. She has not had problems with exertional angina. She has a long-standing history of paroxysmal atrial fibrillation that has responded very well to treatment with flecainide. She has occasional isolated palpitations but has not had any sustained arrhythmia since her last appointment with Dr. Weintraub. She takes a beta blocker for both ventricular rate control and hypertension.  She has never had problems with coronary disease or congestive heart failure. She does have treated hyperlipidemia and hypertension and had a normal nuclear stress test in 2011 (left bundle branch block related artifact, no reversible ischemia). By nuclear scintigraphy her estimated ejection fraction is 59%, by echocardiography her EF is 45-50%, the reduction being attributable to left bundle branch block related asynchrony.   Allergies  Allergen Reactions  . Codeine     Current Outpatient Prescriptions  Medication Sig Dispense Refill  . amitriptyline (ELAVIL) 25 MG tablet Take 25 mg by mouth at bedtime.      . aspirin 162 MG EC tablet Take 162 mg by mouth daily.      . flecainide (TAMBOCOR) 100 MG tablet Take 1 tablet (100 mg total) by mouth 2 (two) times daily.  180 tablet  3  . nebivolol (BYSTOLIC) 10 MG tablet Take 10 mg by mouth daily.      . Pitavastatin Calcium (LIVALO) 1 MG TABS Take 1 tablet by mouth  daily.      . valsartan (DIOVAN) 160 MG tablet Take 160 mg by mouth daily.       No current facility-administered medications for this visit.    Past Medical History  Diagnosis Date  . SSS (sick sinus syndrome)     H/O  . Atrial fibrillation     remote H/O  . LBBB (left bundle branch block)   . Hyperlipidemia   . Symptomatic sinus bradycardia 12/23/2012  . First degree atrioventricular block 12/23/2012  . HTN (hypertension) 12/23/2012    Past Surgical History  Procedure Laterality Date  . Nm myocar perf wall motion  08/05/2009    small to mod. reversible anteroseptal,septal,& apical perfusion defect. EF 59%.    Family History  Problem Relation Age of Onset  . Heart attack Mother   . Stroke Sister     History   Social History  . Marital Status: Married    Spouse Name: N/A    Number of Children: N/A  . Years of Education: N/A   Occupational History  . Not on file.   Social History Main Topics  . Smoking status: Never Smoker   . Smokeless tobacco: Not on file  . Alcohol Use: No  . Drug Use: No  . Sexual Activity: Not on file   Other Topics Concern  . Not on file   Social History Narrative  . No narrative on file    Review of systems: The patient specifically denies any chest pain at rest   or with exertion, dyspnea at rest, orthopnea, paroxysmal nocturnal dyspnea, syncope, palpitations, focal neurological deficits, intermittent claudication, lower extremity edema, unexplained weight gain, cough, hemoptysis or wheezing.  The patient also denies abdominal pain, nausea, vomiting, dysphagia, diarrhea, constipation, polyuria, polydipsia, dysuria, hematuria, frequency, urgency, abnormal bleeding or bruising, fever, chills, unexpected weight changes, mood swings, change in skin or hair texture, change in voice quality, auditory or visual problems, allergic reactions or rashes, new musculoskeletal complaints other than usual "aches and pains".   PHYSICAL EXAM BP  138/80  Pulse 48  Resp 16  Ht 5' 2" (1.575 m)  Wt 154 lb 11.2 oz (70.171 kg)  BMI 28.29 kg/m2  General: Alert, oriented x3, no distress Head: no evidence of trauma, PERRL, EOMI, no exophtalmos or lid lag, no myxedema, no xanthelasma; normal ears, nose and oropharynx Neck: normal jugular venous pulsations and no hepatojugular reflux; brisk carotid pulses without delay and no carotid bruits Chest: clear to auscultation, no signs of consolidation by percussion or palpation, normal fremitus, symmetrical and full respiratory excursions Cardiovascular: normal position and quality of the apical impulse, regular rhythm, normal first and paradoxically split second heart sounds, no murmurs, rubs or gallops Abdomen: no tenderness or distention, no masses by palpation, no abnormal pulsatility or arterial bruits, normal bowel sounds, no hepatosplenomegaly Extremities: no clubbing, cyanosis or edema; 2+ radial, ulnar and brachial pulses bilaterally; 2+ right femoral, posterior tibial and dorsalis pedis pulses; 2+ left femoral, posterior tibial and dorsalis pedis pulses; no subclavian or femoral bruits Neurological: grossly nonfocal   EKG: Marked sinus bradycardia, first degree AV block, left bundle branch block  Lipid Panel     Component Value Date/Time   CHOL 169 05/27/2012 0908   TRIG 121 05/27/2012 0908   HDL 57 05/27/2012 0908   CHOLHDL 3.0 05/27/2012 0908   VLDL 24 05/27/2012 0908   LDLCALC 88 05/27/2012 0908    BMET    Component Value Date/Time   NA 141 05/27/2012 0908   K 4.6 05/27/2012 0908   CL 103 05/27/2012 0908   CO2 29 05/27/2012 0908   GLUCOSE 77 05/27/2012 0908   BUN 20 05/27/2012 0908   CREATININE 1.10 05/27/2012 0908   CALCIUM 10.1 05/27/2012 0908     ASSESSMENT AND PLAN Symptomatic sinus bradycardia She has never had syncope, but is experiencing worsening dyspnea on exertion and sometimes extreme fatigue. She needs to continue taking beta blockers while she takes flecainide for  atrial fibrillation. She will benefit from implantation of a dual-chamber permanent pacemaker. She does not have clear indications for MRI in the future, but may benefit from the atrial tachycardia therapies provided by a Medtronic Advisa device. She also has evidence of AV conduction disease with left bundle branch block and first degree AV block, at risk of developing complete heart block. At this point in time she does not have overt congestive heart failure and has at most minimally depressed left ventricular systolic function. Resynchronization pacing does not appear to be indicated at this time. This procedure has been fully reviewed with the patient and informed consent has been obtained. She is familiar with pacemakers and the implantation procedure from her son Roy "Shane" Stone.   PAF (paroxysmal atrial fibrillation) Flecainide has provided good at arrhythmia suppression, but she is getting older and has at least one risk factor for vascular disease (hyperlipidemia) it is important to periodically reassess for coronary disease. She has not had a functional study since 2011. We'll probably wait for another year or 2   before reassessing, but I instructed her to call me right away if should have any type of chest discomfort, especially if it occurs with activity.  HTN (hypertension) Good control  Hyperlipidemia All parameters are within the desirable range on a very low dose of pitavastatin.   Orders Placed This Encounter  Procedures  . CBC  . Comprehensive metabolic panel  . INR/PT  . PTT  . EKG 12-Lead  . PERMANENT PACEMAKER INSERTION   Bhavesh Vazquez  Emile Ringgenberg, MD, FACC [facility] [phone] office [phone] pager   

## 2012-12-26 NOTE — Interval H&P Note (Signed)
History and Physical Interval Note:  12/26/2012 9:06 AM  Monica Harvey  has presented today for surgery, with the diagnosis of BRADYCARDIA  The various methods of treatment have been discussed with the patient and family. After consideration of risks, benefits and other options for treatment, the patient has consented to  Procedure(s): PERMANENT PACEMAKER INSERTION (N/A) as a surgical intervention .  The patient's history has been reviewed, patient examined, no change in status, stable for surgery.  I have reviewed the patient's chart and labs.  Questions were answered to the patient's satisfaction.     Jarrell Armond

## 2012-12-27 ENCOUNTER — Ambulatory Visit (HOSPITAL_COMMUNITY): Payer: Medicare Other

## 2012-12-27 MED ORDER — YOU HAVE A PACEMAKER BOOK
Freq: Once | Status: AC
  Administered 2012-12-27: 09:00:00
  Filled 2012-12-27: qty 1

## 2012-12-27 MED ORDER — ACETAMINOPHEN 325 MG PO TABS: 325.0000 mg | ORAL_TABLET | ORAL | Status: DC | PRN

## 2012-12-27 NOTE — Discharge Summary (Signed)
Patient ID: Monica Harvey,  MRN: [id number], DOB/AGE: October 08, 1940 72 y.o.  Admit date: 12/26/2012 Discharge date: 12/27/2012  Primary Care Provider: [facility] Primary Cardiologist: Dr Royann Shivers  Discharge Diagnoses Principal Problem:   Symptomatic sinus bradycardia Active Problems:   Pacemaker- MDT implanted 12/26/12   PAF (paroxysmal atrial fibrillation)   HTN (hypertension)   LBBB (left bundle branch block)   First degree atrioventricular block   Hyperlipidemia    Procedures: MDT PTVDP implant 12/26/12   Hospital Course:  72 y/o previously followed by Dr Alanda Amass with a history of PAF and LBBB. She has been in NSR on Flecainide. She has no history of CAD or MI. Her last EF was 45-50% by echo 5/14. She was admitted for an elective pacemaker implant 12/26/12. See Dr Croitoru's office note from 12/23/12. She tolerated this well. CXR shows no PTX. She is discharged, off Flecainide and on ASA 162 mg, on 12/27/12/ She'll need a pacer site check in 7-10 days.  Discharge Vitals:  Blood pressure 113/44, pulse 57, temperature 97.8 F (36.6 C), temperature source Oral, resp. rate 18, height 5\' 2"  (1.575 m), weight 149 lb 11 oz (67.899 kg), SpO2 96.00%.    Labs: Results for orders placed during the hospital encounter of 12/26/12 (from the past 48 hour(s))  SURGICAL PCR SCREEN     Status: None   Collection Time    12/26/12  7:10 AM      Result Value Range   MRSA, PCR NEGATIVE  NEGATIVE   Staphylococcus aureus NEGATIVE  NEGATIVE   Comment:            The Xpert SA Assay (FDA     approved for NASAL specimens     in patients over 89 years of age),     is one component of     a comprehensive surveillance     program.  Test performance has     been validated by [employer] for patients greater     than or equal to 53 year old.     It is not intended     to diagnose infection nor to     guide or monitor treatment.    Disposition:  Follow-up Information   Follow up with Corine Shelter K, PA-C. (office will call you)    Specialty:  Cardiology   Contact information:   [address] [place] [address] [phone]       Discharge Medications:    Medication List    STOP taking these medications       flecainide 100 MG tablet  Commonly known as:  TAMBOCOR      TAKE these medications       acetaminophen 325 MG tablet  Commonly known as:  TYLENOL  Take 1-2 tablets (325-650 mg total) by mouth every 4 (four) hours as needed for mild pain.     amitriptyline 25 MG tablet  Commonly known as:  ELAVIL  Take 25 mg by mouth at bedtime.     aspirin 162 MG EC tablet  Take 162 mg by mouth daily.     LIVALO 1 MG Tabs  Generic drug:  Pitavastatin Calcium  Take 1 tablet by mouth daily.     nebivolol 10 MG tablet  Commonly known as:  BYSTOLIC  Take 10 mg by mouth daily.     valsartan 160 MG tablet  Commonly known as:  DIOVAN  Take 160 mg by mouth daily.  Duration of Discharge Encounter: Greater than 30 minutes including physician time.  Jolene Provost PA-C 12/27/2012 11:11 AM

## 2012-12-27 NOTE — Progress Notes (Signed)
Subjective:  No complaints  Objective:  Vital Signs in the last 24 hours: Temp:  [97.8 F (36.6 C)-98.5 F (36.9 C)] 97.8 F (36.6 C) (12/20 0611) Pulse Rate:  [57-61] 57 (12/20 0611) Resp:  [18] 18 (12/20 0611) BP: (97-132)/(41-80) 113/44 mmHg (12/20 0611) SpO2:  [96 %-100 %] 96 % (12/20 0611) Weight:  [149 lb 11 oz (67.899 kg)-150 lb (68.04 kg)] 149 lb 11 oz (67.899 kg) (12/20 8657)  Intake/Output from previous day: No intake or output data in the 24 hours ending 12/27/12 0942  Physical Exam: General appearance: alert, cooperative and no distress Lungs: clear to auscultation bilaterally Heart: regular rate and rhythm Pacer site dressing intact   Rate: 60  Rhythm: A paced  Lab Results: No results found for this basename: WBC, HGB, PLT,  in the last 72 hours No results found for this basename: NA, K, CL, CO2, GLUCOSE, BUN, CREATININE,  in the last 72 hours No results found for this basename: TROPONINI, CK, MB,  in the last 72 hours No results found for this basename: INR,  in the last 72 hours  Imaging: Imaging results have been reviewed- no PTX  Cardiac Studies:  Assessment/Plan:   Principal Problem:   Symptomatic sinus bradycardia Active Problems:   Pacemaker- MDT implanted 12/26/12   PAF (paroxysmal atrial fibrillation)   HTN (hypertension)   LBBB (left bundle branch block)   First degree atrioventricular block   Hyperlipidemia    PLAN: Dr Royann Shivers to see, home later today. We may need her to come to the hospital next Friday and see an extender for a pacer check as the office is closed Friday. Resume Tambocor 100 mg BID ?  Corine Shelter PA-C Beeper [phone] 12/27/2012, 9:42 AM   I have seen and examined the patient along with Corine Shelter, PA.  I have reviewed the chart, notes and new data.  I agree with PA's note.  Key new complaints: a little sore, feels much more energy with PM in Key examination changes: small amount of bloody drainage on dressing, no  hematoma Key new findings / data: CXR looks good. Atrial lead parameters are much better this morning A lead P 1.5-2.3 mv, IMPEDANCE 437 OHM, THRESHOLD 0.75@0 .4 V  Lead R 9.6 mv, IMPEDANCE 551 OHM, THRESHOLD 0.5@0 .4   PLAN: DC home DC flecainide Wound check 1-2 weeks In office pacemaker reprogramming 4-6 weeks If AF burden is high off flecainide, will switch to sotalol or tikosyn.  Thurmon Fair, MD, [facility] [phone] 12/27/2012, 10:39 AM

## 2013-01-05 ENCOUNTER — Ambulatory Visit (INDEPENDENT_AMBULATORY_CARE_PROVIDER_SITE_OTHER): Payer: Federal, State, Local not specified - PPO | Admitting: Cardiology

## 2013-01-05 ENCOUNTER — Encounter: Payer: Self-pay | Admitting: Cardiology

## 2013-01-05 VITALS — BP 110/70 | HR 60 | Ht 62.0 in | Wt 152.8 lb

## 2013-01-05 DIAGNOSIS — I10 Essential (primary) hypertension: Secondary | ICD-10-CM

## 2013-01-05 DIAGNOSIS — I480 Paroxysmal atrial fibrillation: Secondary | ICD-10-CM

## 2013-01-05 DIAGNOSIS — I4891 Unspecified atrial fibrillation: Secondary | ICD-10-CM

## 2013-01-05 DIAGNOSIS — Z950 Presence of cardiac pacemaker: Secondary | ICD-10-CM

## 2013-01-05 NOTE — Assessment & Plan Note (Signed)
Pacer site well approximated and healing.  Staples removed.  Pt without complaints.

## 2013-01-05 NOTE — Progress Notes (Signed)
          01/05/2013   PCP: Egbert Garibaldi, NP   Chief Complaint  Patient presents with  . Follow-up    remove staples s/p pacer insertion, no redness, drainage, edema, hematoma          Primary Cardiologist: Dr. Royann Shivers  HPI:  Pt here today for follow up placement of PPM 12/26/12, medtronic device.  She has hx of PAF and believes she had one short lived episode of a fib since PPM.  She has no complaints.  Pacer site well healed.  Allergies  Allergen Reactions  . Codeine Nausea And Vomiting    Current Outpatient Prescriptions  Medication Sig Dispense Refill  . acetaminophen (TYLENOL) 325 MG tablet Take 1-2 tablets (325-650 mg total) by mouth every 4 (four) hours as needed for mild pain.      Marland Kitchen amitriptyline (ELAVIL) 25 MG tablet Take 25 mg by mouth at bedtime.      Marland Kitchen aspirin 162 MG EC tablet Take 162 mg by mouth daily.      . nebivolol (BYSTOLIC) 10 MG tablet Take 10 mg by mouth daily.      . Pitavastatin Calcium (LIVALO) 1 MG TABS Take 1 tablet by mouth daily.      . valsartan (DIOVAN) 160 MG tablet Take 160 mg by mouth daily.       No current facility-administered medications for this visit.    Past Medical History  Diagnosis Date  . SSS (sick sinus syndrome)     with PPM-Medtronic placed 12/26/12   . Atrial fibrillation     remote H/O  . LBBB (left bundle branch block)   . Hyperlipidemia   . Symptomatic sinus bradycardia 12/23/2012  . First degree atrioventricular block 12/23/2012  . HTN (hypertension) 12/23/2012    Past Surgical History  Procedure Laterality Date  . Nm myocar perf wall motion  08/05/2009    small to mod. reversible anteroseptal,septal,& apical perfusion defect. EF 59%.  . Pacemaker insertion  12/26/12    Medtronic, Dr. Royann Shivers    ZOX:WRUEAVW:UJ colds or fevers, no weight changes Skin:no rashes or ulcers CV:see HPI PUL:see HPI   PHYSICAL EXAM BP 110/70  Pulse 60  Ht 5\' 2"  (1.575 m)  Wt 152 lb 12.8 oz (69.31 kg)  BMI  27.94 kg/m2 General:Pleasant affect, NAD Skin:Warm and dry, brisk capillary refill, pacer site healed without drainage and only minimal swelling.   Heart:S1S2 RRR without murmur, gallup, rub or click Lungs:clear without rales, rhonchi, or wheezes Neuro:alert and oriented, MAE, follows commands   ASSESSMENT AND PLAN Pacemaker- MDT implanted 12/26/12 Pacer site well approximated and healing.  Staples removed.  Pt without complaints.  PAF (paroxysmal atrial fibrillation) One episode of irreg heart rate though very brief.  She will notify us if more episodes.  HTN (hypertension) controlled  She will follow up with Dr. Royann Shivers in 4 weeks for pacer interrogation.

## 2013-01-05 NOTE — Assessment & Plan Note (Signed)
controlled 

## 2013-01-05 NOTE — Patient Instructions (Signed)
Pacer site is healing well.  Do not raise over your head but it is ok to move the arm more.  No lifting over 5 pounds in lt arm.    Follow up with Dr. Royann Shivers in 4 weeks for pacer adjustment.  Call if any problems with the site or more episodes of frequent fast heart rates.

## 2013-01-05 NOTE — Assessment & Plan Note (Signed)
One episode of irreg heart rate though very brief.  She will notify us if more episodes.

## 2013-02-03 ENCOUNTER — Encounter: Payer: Self-pay | Admitting: Cardiovascular Disease

## 2013-02-03 ENCOUNTER — Ambulatory Visit (INDEPENDENT_AMBULATORY_CARE_PROVIDER_SITE_OTHER): Payer: Medicare Other | Admitting: Cardiovascular Disease

## 2013-02-03 VITALS — BP 132/70 | HR 68 | Ht 62.0 in | Wt 154.1 lb

## 2013-02-03 DIAGNOSIS — E785 Hyperlipidemia, unspecified: Secondary | ICD-10-CM

## 2013-02-03 DIAGNOSIS — I4891 Unspecified atrial fibrillation: Secondary | ICD-10-CM

## 2013-02-03 DIAGNOSIS — I10 Essential (primary) hypertension: Secondary | ICD-10-CM

## 2013-02-03 DIAGNOSIS — Z950 Presence of cardiac pacemaker: Secondary | ICD-10-CM

## 2013-02-03 DIAGNOSIS — I480 Paroxysmal atrial fibrillation: Secondary | ICD-10-CM

## 2013-02-03 DIAGNOSIS — R001 Bradycardia, unspecified: Secondary | ICD-10-CM

## 2013-02-03 DIAGNOSIS — I498 Other specified cardiac arrhythmias: Secondary | ICD-10-CM

## 2013-02-03 LAB — PACEMAKER DEVICE OBSERVATION

## 2013-02-03 NOTE — Assessment & Plan Note (Signed)
Excellent control.   

## 2013-02-03 NOTE — Assessment & Plan Note (Addendum)
Satisfactory lipid levels

## 2013-02-03 NOTE — Assessment & Plan Note (Signed)
Will use beta blockers alone for now. Option to restart flecainide if atrial for ablation fibrillation burden is high

## 2013-02-03 NOTE — Patient Instructions (Signed)
Remote monitoring is used to monitor your pacemaker from home. This monitoring reduces the number of office visits required to check your device to one time per year. It allows [place] to keep an eye on the functioning of your device to ensure it is working properly. You are scheduled for a device check from home on 05-07-2013. You may send your transmission at any time that day. If you have a wireless device, the transmission will be sent automatically. After your physician reviews your transmission, you will receive a postcard with your next transmission date.  Your physician recommends that you schedule a follow-up appointment in: 6 months

## 2013-02-03 NOTE — Assessment & Plan Note (Signed)
Comprehensive check in the office today shows normal device function. Lead outputs change to chronic settings. Discussed remote [employer] monitoring.

## 2013-02-03 NOTE — Progress Notes (Signed)
Patient ID: Monica Harvey, female   DOB: 06-11-1940, 73 y.o.   MRN: [id number]     Reason for office visit Pacemaker followup, atrial fibrillation  Roughly one month following implantation of a dual-chamber permanent pacemaker for symptomatic bradycardia and tachycardia bradycardia syndrome, Monica Harvey generally feeling well. She has had a few episodes of dizziness but interrogation of her pacemaker showed no episodes of atrial fibrillation since device implantation. Because of her relatively low CHADS score she is receiving aspirin for stroke prophylaxis. She was treated with flecainide for a long period of time, but we have decided to discontinue this medication and monitor for atrial fibrillation using her pacemaker.   Allergies  Allergen Reactions  . Codeine Nausea And Vomiting    Current Outpatient Prescriptions  Medication Sig Dispense Refill  . acetaminophen (TYLENOL) 325 MG tablet Take 1-2 tablets (325-650 mg total) by mouth every 4 (four) hours as needed for mild pain.      Marland Kitchen. amitriptyline (ELAVIL) 25 MG tablet Take 25 mg by mouth at bedtime.      Marland Kitchen. aspirin 162 MG EC tablet Take 162 mg by mouth daily.      . nebivolol (BYSTOLIC) 10 MG tablet Take 10 mg by mouth daily.      . Pitavastatin Calcium (LIVALO) 1 MG TABS Take 1 tablet by mouth daily.      . valsartan (DIOVAN) 160 MG tablet Take 160 mg by mouth daily.       No current facility-administered medications for this visit.    Past Medical History  Diagnosis Date  . SSS (sick sinus syndrome)     with PPM-Medtronic placed 12/26/12   . Atrial fibrillation     remote H/O  . LBBB (left bundle branch block)   . Hyperlipidemia   . Symptomatic sinus bradycardia 12/23/2012  . First degree atrioventricular block 12/23/2012  . HTN (hypertension) 12/23/2012    Past Surgical History  Procedure Laterality Date  . Nm myocar perf wall motion  08/05/2009    small to mod. reversible anteroseptal,septal,& apical perfusion  defect. EF 59%.  . Pacemaker insertion  12/26/12    Medtronic, Dr. Royann Shiversroitoru    Family History  Problem Relation Age of Onset  . Heart attack Mother   . Stroke Sister     History   Social History  . Marital Status: Married    Spouse Name: N/A    Number of Children: N/A  . Years of Education: N/A   Occupational History  . Not on file.   Social History Main Topics  . Smoking status: Never Smoker   . Smokeless tobacco: Never Used  . Alcohol Use: No  . Drug Use: No  . Sexual Activity: Not on file   Other Topics Concern  . Not on file   Social History Narrative  . No narrative on file    Review of systems: The patient specifically denies any chest pain at rest or with exertion, dyspnea at rest or with exertion, orthopnea, paroxysmal nocturnal dyspnea, syncope, palpitations, focal neurological deficits, intermittent claudication, lower extremity edema, unexplained weight gain, cough, hemoptysis or wheezing.  The patient also denies abdominal pain, nausea, vomiting, dysphagia, diarrhea, constipation, polyuria, polydipsia, dysuria, hematuria, frequency, urgency, abnormal bleeding or bruising, fever, chills, unexpected weight changes, mood swings, change in skin or hair texture, change in voice quality, auditory or visual problems, allergic reactions or rashes, new musculoskeletal complaints other than usual "aches and pains".   PHYSICAL EXAM BP 132/70  Pulse 68  Ht 5\' 2"  (1.575 m)  Wt 69.899 kg (154 lb 1.6 oz)  BMI 28.18 kg/m2  General: Alert, oriented x3, no distress Head: no evidence of trauma, PERRL, EOMI, no exophtalmos or lid lag, no myxedema, no xanthelasma; normal ears, nose and oropharynx Neck: normal jugular venous pulsations and no hepatojugular reflux; brisk carotid pulses without delay and no carotid bruits Chest: clear to auscultation, no signs of consolidation by percussion or palpation, normal fremitus, symmetrical and full respiratory excursions.  Healthy-appearing vertical scar in the left subclavian area Cardiovascular: normal position and quality of the apical impulse, regular rhythm, normal first and second heart sounds, no murmurs, rubs or gallops Abdomen: no tenderness or distention, no masses by palpation, no abnormal pulsatility or arterial bruits, normal bowel sounds, no hepatosplenomegaly Extremities: no clubbing, cyanosis or edema; 2+ radial, ulnar and brachial pulses bilaterally; 2+ right femoral, posterior tibial and dorsalis pedis pulses; 2+ left femoral, posterior tibial and dorsalis pedis pulses; no subclavian or femoral bruits Neurological: grossly nonfocal   EKG: Paced, ventricular sensed, chronic left bundle branch block. QRS duration 174 ms. QTC 510 ms  Lipid Panel     Component Value Date/Time   CHOL 169 05/27/2012 0908   TRIG 121 05/27/2012 0908   HDL 57 05/27/2012 0908   CHOLHDL 3.0 05/27/2012 0908   VLDL 24 05/27/2012 0908   LDLCALC 88 05/27/2012 0908    BMET    Component Value Date/Time   NA 143 12/23/2012 1014   K 4.6 12/23/2012 1014   CL 104 12/23/2012 1014   CO2 26 12/23/2012 1014   GLUCOSE 94 12/23/2012 1014   BUN 13 12/23/2012 1014   CREATININE 1.12* 12/23/2012 1014   CALCIUM 10.3 12/23/2012 1014     ASSESSMENT AND PLAN PAF (paroxysmal atrial fibrillation) Will use beta blockers alone for now. Option to restart flecainide if atrial for ablation fibrillation burden is high  Pacemaker- MDT implanted 12/26/12 Comprehensive check in the office today shows normal device function. Lead outputs change to chronic settings. Discussed remote [employer] monitoring.  Hyperlipidemia Satisfactory lipid levels  HTN (hypertension) Excellent control   Patient Instructions  Remote monitoring is used to monitor your pacemaker from home. This monitoring reduces the number of office visits required to check your device to one time per year. It allows [place] to keep an eye on the functioning of your device to  ensure it is working properly. You are scheduled for a device check from home on 05-07-2013. You may send your transmission at any time that day. If you have a wireless device, the transmission will be sent automatically. After your physician reviews your transmission, you will receive a postcard with your next transmission date.  Your physician recommends that you schedule a follow-up appointment in: 6 months          Rosmery Duggin  Thurmon Fair, MD, [facility] [phone] office [phone] pager

## 2013-02-06 LAB — MDC_IDC_ENUM_SESS_TYPE_INCLINIC
Battery Remaining Longevity: 13.5
Battery Voltage: 3.1 V
Brady Statistic AP VP Percent: 0.1 % — CL
Brady Statistic AP VS Percent: 50.2 %
Lead Channel Pacing Threshold Amplitude: 0.75 V
Lead Channel Pacing Threshold Pulse Width: 0.4 ms
Lead Channel Sensing Intrinsic Amplitude: 13.3 mV
Lead Channel Sensing Intrinsic Amplitude: 2.8 mV
Lead Channel Setting Pacing Amplitude: 2.5 V
Lead Channel Setting Pacing Pulse Width: 0.4 ms
Lead Channel Setting Sensing Sensitivity: 0.9 mV
MDC IDC MSMT LEADCHNL RA IMPEDANCE VALUE: 418 Ohm
MDC IDC MSMT LEADCHNL RA PACING THRESHOLD PULSEWIDTH: 0.4 ms
MDC IDC MSMT LEADCHNL RV IMPEDANCE VALUE: 475 Ohm
MDC IDC MSMT LEADCHNL RV PACING THRESHOLD AMPLITUDE: 0.75 V
MDC IDC SET LEADCHNL RA PACING AMPLITUDE: 2 V
MDC IDC STAT BRADY AS VP PERCENT: 0.1 % — AB
MDC IDC STAT BRADY AS VS PERCENT: 49.8 %

## 2013-02-09 ENCOUNTER — Other Ambulatory Visit: Payer: Self-pay

## 2013-02-09 NOTE — Telephone Encounter (Signed)
Please advise for refill of Amitriptyline?

## 2013-02-09 NOTE — Telephone Encounter (Signed)
Please ask her PCP

## 2013-05-07 ENCOUNTER — Encounter: Payer: Medicare Other | Admitting: *Deleted

## 2013-05-20 ENCOUNTER — Encounter: Payer: Self-pay | Admitting: *Deleted

## 2013-07-02 ENCOUNTER — Other Ambulatory Visit: Payer: Self-pay | Admitting: Pharmacist Clinician (PhC)/ Clinical Pharmacy Specialist

## 2013-07-03 NOTE — Telephone Encounter (Signed)
Rx refill sent to patient pharmacy   

## 2013-07-07 ENCOUNTER — Other Ambulatory Visit: Payer: Self-pay | Admitting: *Deleted

## 2013-07-07 NOTE — Telephone Encounter (Signed)
Refill for amitriptyline refused. Defer to PCP

## 2013-07-14 ENCOUNTER — Other Ambulatory Visit: Payer: Self-pay

## 2013-07-14 MED ORDER — VALSARTAN 160 MG PO TABS: 160.0000 mg | ORAL_TABLET | Freq: Every day | ORAL | Status: DC

## 2013-07-14 MED ORDER — PITAVASTATIN CALCIUM 1 MG PO TABS: 1.0000 | ORAL_TABLET | Freq: Every day | ORAL | Status: DC

## 2013-07-14 MED ORDER — NEBIVOLOL HCL 10 MG PO TABS: 10.0000 mg | ORAL_TABLET | Freq: Every day | ORAL | Status: DC

## 2013-07-14 NOTE — Telephone Encounter (Signed)
Rx was sent to pharmacy electronically - Valsartan, Livalo, Bystolic.  Per Dr Royann Shivers - defer Amitriptyline to PCP.

## 2013-07-16 ENCOUNTER — Telehealth: Payer: Self-pay | Admitting: Cardiovascular Disease

## 2013-07-16 ENCOUNTER — Other Ambulatory Visit: Payer: Self-pay | Admitting: *Deleted

## 2013-07-16 MED ORDER — AMITRIPTYLINE HCL 25 MG PO TABS
25.0000 mg | ORAL_TABLET | Freq: Every day | ORAL | Status: DC
Start: 2013-07-16 — End: 2013-12-04

## 2013-07-16 NOTE — Telephone Encounter (Signed)
Patient has two different middle names. Pharmacy has one middle initial and we have the other so they would not fill the Rx. I left Mrs.Novell a message after speaking with the pharmacy and told her their instructions to "call them and verify the middle name and the medications will be sent out"

## 2013-07-16 NOTE — Telephone Encounter (Signed)
Monica Harvey is calling because she received a letter from her mail order ([facility]) that they cannot refill her medication because her doctor has already sent a new prescription to them. The medications are Bystolic 10mg  , Livalo 1mg , Valsartan 160mg  and Amitriptyline 25mg  . It shows that they have received the refill request back but will not refill for some reason .Marland Kitchen Please call    Thanks

## 2013-08-03 ENCOUNTER — Other Ambulatory Visit: Payer: Self-pay

## 2013-08-03 DIAGNOSIS — Z1231 Encounter for screening mammogram for malignant neoplasm of breast: Secondary | ICD-10-CM

## 2013-08-12 ENCOUNTER — Ambulatory Visit
Admission: RE | Admit: 2013-08-12 | Discharge: 2013-08-12 | Disposition: A | Discharge status: Home / Self Care | Payer: Medicare Other | Source: Ambulatory Visit

## 2013-08-12 ENCOUNTER — Encounter (INDEPENDENT_AMBULATORY_CARE_PROVIDER_SITE_OTHER): Payer: Self-pay

## 2013-08-12 DIAGNOSIS — Z1231 Encounter for screening mammogram for malignant neoplasm of breast: Secondary | ICD-10-CM

## 2013-08-18 ENCOUNTER — Ambulatory Visit (INDEPENDENT_AMBULATORY_CARE_PROVIDER_SITE_OTHER): Payer: Medicare Other | Admitting: Cardiovascular Disease

## 2013-08-18 ENCOUNTER — Encounter: Payer: Self-pay | Admitting: Cardiovascular Disease

## 2013-08-18 VITALS — BP 122/70 | HR 63 | Resp 16 | Ht 62.0 in | Wt 161.1 lb

## 2013-08-18 DIAGNOSIS — I440 Atrioventricular block, first degree: Secondary | ICD-10-CM

## 2013-08-18 DIAGNOSIS — I447 Left bundle-branch block, unspecified: Secondary | ICD-10-CM

## 2013-08-18 DIAGNOSIS — E785 Hyperlipidemia, unspecified: Secondary | ICD-10-CM

## 2013-08-18 DIAGNOSIS — I480 Paroxysmal atrial fibrillation: Secondary | ICD-10-CM

## 2013-08-18 DIAGNOSIS — E782 Mixed hyperlipidemia: Secondary | ICD-10-CM

## 2013-08-18 DIAGNOSIS — I429 Cardiomyopathy, unspecified: Secondary | ICD-10-CM | POA: Insufficient documentation

## 2013-08-18 DIAGNOSIS — Z950 Presence of cardiac pacemaker: Secondary | ICD-10-CM

## 2013-08-18 DIAGNOSIS — I4891 Unspecified atrial fibrillation: Secondary | ICD-10-CM

## 2013-08-18 DIAGNOSIS — I498 Other specified cardiac arrhythmias: Secondary | ICD-10-CM

## 2013-08-18 DIAGNOSIS — R001 Bradycardia, unspecified: Secondary | ICD-10-CM

## 2013-08-18 DIAGNOSIS — Z79899 Other long term (current) drug therapy: Secondary | ICD-10-CM

## 2013-08-18 DIAGNOSIS — I428 Other cardiomyopathies: Secondary | ICD-10-CM

## 2013-08-18 MED ORDER — EDOXABAN TOSYLATE 60 MG PO TABS
60.0000 mg | ORAL_TABLET | Freq: Every day | ORAL | Status: DC
Start: 2013-08-18 — End: 2014-01-25

## 2013-08-18 MED ORDER — EDOXABAN TOSYLATE 60 MG PO TABS: 60.0000 mg | ORAL_TABLET | Freq: Every day | ORAL | Status: DC

## 2013-08-18 NOTE — Assessment & Plan Note (Signed)
Antiarrhythmics do not appear indicated since the burden of atrial fibrillation is very low. She should be on anticoagulants. Will stop aspirin and start Savaysa 60 mg daily.

## 2013-08-18 NOTE — Patient Instructions (Addendum)
STOP Aspirin.  START Savaysa 60mg  daily.  Samples given.  Rx sent to your MailOrder.  If you find this is too expensive please contact our office there are alternatives to this medications.  Remote monitoring is used to monitor your pacemaker from home. This monitoring reduces the number of office visits required to check your device to one time per year. It allows [place] to keep an eye on the functioning of your device to ensure it is working properly. You are scheduled for a device check from home on 11-19-2013. You may send your transmission at any time that day. If you have a wireless device, the transmission will be sent automatically. After your physician reviews your transmission, you will receive a postcard with your next transmission date.  Your physician recommends that you schedule a follow-up appointment in: 12 months with Dr.Croitoru

## 2013-08-18 NOTE — Assessment & Plan Note (Signed)
Normal device function. No permanent reprogramming was performed today. Continue remote monitoring every 3 months in followup in the office in 12 months.

## 2013-08-18 NOTE — Assessment & Plan Note (Signed)
Plan to recheck lipids today.

## 2013-08-18 NOTE — Progress Notes (Signed)
Patient ID: Monica Harvey, female   DOB: 10-03-1940, 73 y.o.   MRN: [id number]      Reason for office visit Atrial fibrillation, pacemaker check  Monica Harvey had a dual-chamber permanent pacemaker implanted roughly 8 months ago for symptomatic bradycardia and tachycardia/bradycardia syndrome, with intermittent episodes of paroxysmal atrial fibrillation. Her major complaint is fatigue and she would like her pacemaker "turned up". She has treated hypertension and hyperlipidemia, mildly depressed left ventricular ejection fraction (EF 45-50% by echo) without clinical heart failure, chronic left bundle branch block and a mildly abnormal myocardial perfusion study (small to moderate reversible anteroseptal septal and apical perfusion defect felt to be consistent with LAD ischemia superimposed on underlying LBBB artifact).  CHADSVasc score is 4. No history of stroke/TIA or bleeding problems.  Pacemaker interrogation shows a lengthy episode of atrial fibrillation lasting almost 39 hours that occurred in May. The overall burden of atrial fibrillation is quite low, only 0.8%. Ventricular rate control is fair during the episode of atrial fibrillation. Heart rate histogram distribution is favorable. She paces the atrium roughly 1/3 of the time and does not need ventricular pacing.  Her son Monica Harvey also has a pacemaker and is our patient.  She complains of fatigue and mild exertional dyspnea, not much change from last year.   Allergies  Allergen Reactions  . Codeine Nausea And Vomiting    Current Outpatient Prescriptions  Medication Sig Dispense Refill  . acetaminophen (TYLENOL) 325 MG tablet Take 1-2 tablets (325-650 mg total) by mouth every 4 (four) hours as needed for mild pain.      Marland Kitchen amitriptyline (ELAVIL) 25 MG tablet Take 1 tablet (25 mg total) by mouth at bedtime.  90 tablet  1  . aspirin 162 MG EC tablet Take 162 mg by mouth daily.      . nebivolol (BYSTOLIC) 10 MG tablet Take 1 tablet (10  mg total) by mouth daily.  90 tablet  1  . Pitavastatin Calcium (LIVALO) 1 MG TABS Take 1 tablet (1 mg total) by mouth daily.  90 tablet  1  . valsartan (DIOVAN) 160 MG tablet Take 1 tablet (160 mg total) by mouth daily.  90 tablet  1   No current facility-administered medications for this visit.    Past Medical History  Diagnosis Date  . SSS (sick sinus syndrome)     with PPM-Medtronic placed 12/26/12   . Atrial fibrillation     remote H/O  . LBBB (left bundle branch block)   . Hyperlipidemia   . Symptomatic sinus bradycardia 12/23/2012  . First degree atrioventricular block 12/23/2012  . HTN (hypertension) 12/23/2012    Past Surgical History  Procedure Laterality Date  . Nm myocar perf wall motion  08/05/2009    small to mod. reversible anteroseptal,septal,& apical perfusion defect. EF 59%.  . Pacemaker insertion  12/26/12    Medtronic, Dr. Royann Shivers    Family History  Problem Relation Age of Onset  . Heart attack Mother   . Stroke Sister     History   Social History  . Marital Status: Married    Spouse Name: N/A    Number of Children: N/A  . Years of Education: N/A   Occupational History  . Not on file.   Social History Main Topics  . Smoking status: Never Smoker   . Smokeless tobacco: Never Used  . Alcohol Use: No  . Drug Use: No  . Sexual Activity: Not on file   Other Topics Concern  .  Not on file   Social History Narrative  . No narrative on file    Review of systems: The patient specifically denies any chest pain at rest or with exertion, dyspnea at rest or with exertion, orthopnea, paroxysmal nocturnal dyspnea, syncope, palpitations, focal neurological deficits, intermittent claudication, lower extremity edema, unexplained weight gain, cough, hemoptysis or wheezing.  The patient also denies abdominal pain, nausea, vomiting, dysphagia, diarrhea, constipation, polyuria, polydipsia, dysuria, hematuria, frequency, urgency, abnormal bleeding or bruising,  fever, chills, unexpected weight changes, mood swings, change in skin or hair texture, change in voice quality, auditory or visual problems, allergic reactions or rashes, new musculoskeletal complaints other than usual "aches and pains".   PHYSICAL EXAM BP 122/70  Pulse 63  Resp 16  Ht 5\' 2"  (1.575 m)  Wt 161 lb 1.6 oz (73.074 kg)  BMI 29.46 kg/m2  General: Alert, oriented x3, no distress Head: no evidence of trauma, PERRL, EOMI, no exophtalmos or lid lag, no myxedema, no xanthelasma; normal ears, nose and oropharynx Neck: normal jugular venous pulsations and no hepatojugular reflux; brisk carotid pulses without delay and no carotid bruits Chest: clear to auscultation, no signs of consolidation by percussion or palpation, normal fremitus, symmetrical and full respiratory excursions Cardiovascular: normal position and quality of the apical impulse, regular rhythm, normal first and paradoxically split second heart sounds, no murmurs, rubs or gallops Abdomen: no tenderness or distention, no masses by palpation, no abnormal pulsatility or arterial bruits, normal bowel sounds, no hepatosplenomegaly Extremities: no clubbing, cyanosis or edema; 2+ radial, ulnar and brachial pulses bilaterally; 2+ right femoral, posterior tibial and dorsalis pedis pulses; 2+ left femoral, posterior tibial and dorsalis pedis pulses; no subclavian or femoral bruits Neurological: grossly nonfocal   EKG: Normal sinus rhythm, left bundle branch block  Lipid Panel     Component Value Date/Time   CHOL 169 05/27/2012 0908   TRIG 121 05/27/2012 0908   HDL 57 05/27/2012 0908   CHOLHDL 3.0 05/27/2012 0908   VLDL 24 05/27/2012 0908   LDLCALC 88 05/27/2012 0908    BMET    Component Value Date/Time   NA 143 12/23/2012 1014   K 4.6 12/23/2012 1014   CL 104 12/23/2012 1014   CO2 26 12/23/2012 1014   GLUCOSE 94 12/23/2012 1014   BUN 13 12/23/2012 1014   CREATININE 1.12* 12/23/2012 1014   CALCIUM 10.3 12/23/2012 1014      ASSESSMENT AND PLAN PAF (paroxysmal atrial fibrillation) Antiarrhythmics do not appear indicated since the burden of atrial fibrillation is very low. She should be on anticoagulants. Will stop aspirin and start Savaysa 60 mg daily.  Pacemaker- MDT implanted 12/26/12 Normal device function. No permanent reprogramming was performed today. Continue remote monitoring every 3 months in followup in the office in 12 months.  Cardiomyopathy She has very mild left ventricular dysfunction, possibly was related to left bundle branch block induced asynchrony. She does have had clinical heart failure. She is on full dose angiotensin receptor blocker and also takes a beta blocker. We'll not increase the beta blocker due to the complaints of fatigue. If she develops overt heart failure and EF diminishes, option for biventricular pacemaker upgrade in the future. She does not have angina pectoris  Hyperlipidemia Plan to recheck lipids today.   Orders Placed This Encounter  Procedures  . EKG 12-Lead   No orders of the defined types were placed in this encounter.    Junious SilkROITORU,Naydene Kamrowski  Camar Guyton, MD, [facility] [phone] office [phone] pager

## 2013-08-18 NOTE — Assessment & Plan Note (Signed)
She has very mild left ventricular dysfunction, possibly was related to left bundle branch block induced asynchrony. She does have had clinical heart failure. She is on full dose angiotensin receptor blocker and also takes a beta blocker. We'll not increase the beta blocker due to the complaints of fatigue. If she develops overt heart failure and EF diminishes, option for biventricular pacemaker upgrade in the future. She does not have angina pectoris

## 2013-08-19 LAB — COMPREHENSIVE METABOLIC PANEL
ALT: 38 U/L — ABNORMAL HIGH (ref 0–35)
AST: 40 U/L — ABNORMAL HIGH (ref 0–37)
Albumin: 4.4 g/dL (ref 3.5–5.2)
Alkaline Phosphatase: 143 U/L — ABNORMAL HIGH (ref 39–117)
BILIRUBIN TOTAL: 0.5 mg/dL (ref 0.2–1.2)
BUN: 20 mg/dL (ref 6–23)
CO2: 25 mEq/L (ref 19–32)
CREATININE: 1.25 mg/dL — AB (ref 0.50–1.10)
Calcium: 10.1 mg/dL (ref 8.4–10.5)
Chloride: 103 mEq/L (ref 96–112)
GLUCOSE: 99 mg/dL (ref 70–99)
Potassium: 4.9 mEq/L (ref 3.5–5.3)
Sodium: 140 mEq/L (ref 135–145)
Total Protein: 7 g/dL (ref 6.0–8.3)

## 2013-08-19 LAB — MDC_IDC_ENUM_SESS_TYPE_INCLINIC
Battery Voltage: 3.05 V
Brady Statistic AP VS Percent: 37.7 %
Brady Statistic AS VS Percent: 62.2 %
Lead Channel Impedance Value: 475 Ohm
Lead Channel Pacing Threshold Amplitude: 0.5 V
Lead Channel Pacing Threshold Pulse Width: 0.4 ms
Lead Channel Sensing Intrinsic Amplitude: 11.6 mV
Lead Channel Sensing Intrinsic Amplitude: 2.4 mV
Lead Channel Setting Sensing Sensitivity: 0.9 mV
MDC IDC MSMT LEADCHNL RA IMPEDANCE VALUE: 437 Ohm
MDC IDC MSMT LEADCHNL RV PACING THRESHOLD AMPLITUDE: 0.75 V
MDC IDC MSMT LEADCHNL RV PACING THRESHOLD PULSEWIDTH: 0.4 ms
MDC IDC SET LEADCHNL RA PACING AMPLITUDE: 2 V
MDC IDC SET LEADCHNL RV PACING AMPLITUDE: 2.5 V
MDC IDC SET LEADCHNL RV PACING PULSEWIDTH: 0.4 ms
MDC IDC STAT BRADY AP VP PERCENT: 0.1 % — AB
MDC IDC STAT BRADY AS VP PERCENT: 0.1 % — AB

## 2013-08-19 LAB — LIPID PANEL
CHOL/HDL RATIO: 3.5 ratio
CHOLESTEROL: 189 mg/dL (ref 0–200)
HDL: 54 mg/dL (ref >39)
LDL CALC: 98 mg/dL (ref 0–99)
TRIGLYCERIDES: 183 mg/dL — AB (ref <150)
VLDL: 37 mg/dL (ref 0–40)

## 2013-09-21 ENCOUNTER — Encounter: Payer: Self-pay | Admitting: Cardiovascular Disease

## 2013-11-19 ENCOUNTER — Encounter: Payer: Federal, State, Local not specified - PPO | Admitting: *Deleted

## 2013-11-20 ENCOUNTER — Telehealth: Payer: Self-pay | Admitting: Cardiovascular Disease

## 2013-11-20 ENCOUNTER — Telehealth: Payer: Self-pay | Admitting: Cardiology

## 2013-11-20 NOTE — Telephone Encounter (Signed)
LMOVM reminding pt to send remote transmission.   

## 2013-11-20 NOTE — Telephone Encounter (Signed)
LMOVM for pt to return call 

## 2013-11-20 NOTE — Telephone Encounter (Signed)
Pt informed me that she does not have a home monitor. I ordered her a home monitor today. I informed her it should be at her home in 2 weeks or so and to send transmission when she receives the monitor.

## 2013-11-20 NOTE — Telephone Encounter (Signed)
New Prob     Pt states she has not received her remote device in the mail. States this is the second attempt to mail to her. Please call.

## 2013-12-04 ENCOUNTER — Other Ambulatory Visit: Payer: Self-pay | Admitting: Cardiovascular Disease

## 2013-12-17 ENCOUNTER — Encounter (HOSPITAL_COMMUNITY): Payer: Self-pay | Admitting: Cardiovascular Disease

## 2013-12-21 ENCOUNTER — Other Ambulatory Visit: Payer: Self-pay | Admitting: Cardiovascular Disease

## 2013-12-21 MED ORDER — VALSARTAN 160 MG PO TABS: 160.0000 mg | ORAL_TABLET | Freq: Every day | ORAL | Status: DC

## 2013-12-21 MED ORDER — PITAVASTATIN CALCIUM 1 MG PO TABS: 1.0000 | ORAL_TABLET | Freq: Every day | ORAL | Status: DC

## 2013-12-21 NOTE — Telephone Encounter (Signed)
Pt still have not received her Valsartan and her Livalo #90. Would you please call these to [facility]-[phone]

## 2013-12-21 NOTE — Telephone Encounter (Signed)
Rx was sent to pharmacy electronically. 

## 2013-12-30 ENCOUNTER — Encounter: Payer: Self-pay | Admitting: *Deleted

## 2014-01-21 ENCOUNTER — Encounter (HOSPITAL_COMMUNITY): Payer: Self-pay | Admitting: *Deleted

## 2014-01-21 ENCOUNTER — Emergency Department (HOSPITAL_COMMUNITY): Payer: Medicare Other

## 2014-01-21 ENCOUNTER — Inpatient Hospital Stay (HOSPITAL_COMMUNITY)
Admission: EM | Admit: 2014-01-21 | Discharge: 2014-01-25 | DRG: 308 | Disposition: A | Discharge status: Home / Self Care | Payer: Medicare Other | Attending: Interventional Cardiology | Admitting: Interventional Cardiology

## 2014-01-21 DIAGNOSIS — I429 Cardiomyopathy, unspecified: Secondary | ICD-10-CM

## 2014-01-21 DIAGNOSIS — Z886 Allergy status to analgesic agent status: Secondary | ICD-10-CM

## 2014-01-21 DIAGNOSIS — Z7901 Long term (current) use of anticoagulants: Secondary | ICD-10-CM

## 2014-01-21 DIAGNOSIS — I4891 Unspecified atrial fibrillation: Secondary | ICD-10-CM | POA: Diagnosis present

## 2014-01-21 DIAGNOSIS — I480 Paroxysmal atrial fibrillation: Secondary | ICD-10-CM | POA: Diagnosis present

## 2014-01-21 DIAGNOSIS — E785 Hyperlipidemia, unspecified: Secondary | ICD-10-CM | POA: Diagnosis present

## 2014-01-21 DIAGNOSIS — E669 Obesity, unspecified: Secondary | ICD-10-CM | POA: Diagnosis present

## 2014-01-21 DIAGNOSIS — I447 Left bundle-branch block, unspecified: Secondary | ICD-10-CM | POA: Diagnosis present

## 2014-01-21 DIAGNOSIS — I129 Hypertensive chronic kidney disease with stage 1 through stage 4 chronic kidney disease, or unspecified chronic kidney disease: Secondary | ICD-10-CM | POA: Diagnosis present

## 2014-01-21 DIAGNOSIS — Z6828 Body mass index (BMI) 28.0-28.9, adult: Secondary | ICD-10-CM | POA: Diagnosis not present

## 2014-01-21 DIAGNOSIS — N183 Chronic kidney disease, stage 3 unspecified: Secondary | ICD-10-CM | POA: Diagnosis present

## 2014-01-21 DIAGNOSIS — Z950 Presence of cardiac pacemaker: Secondary | ICD-10-CM | POA: Diagnosis present

## 2014-01-21 DIAGNOSIS — I5043 Acute on chronic combined systolic (congestive) and diastolic (congestive) heart failure: Secondary | ICD-10-CM | POA: Diagnosis present

## 2014-01-21 DIAGNOSIS — I5033 Acute on chronic diastolic (congestive) heart failure: Secondary | ICD-10-CM | POA: Diagnosis present

## 2014-01-21 DIAGNOSIS — I495 Sick sinus syndrome: Secondary | ICD-10-CM

## 2014-01-21 DIAGNOSIS — I10 Essential (primary) hypertension: Secondary | ICD-10-CM

## 2014-01-21 HISTORY — DX: Cardiac murmur, unspecified: R01.1

## 2014-01-21 HISTORY — DX: Angina pectoris, unspecified: I20.9

## 2014-01-21 HISTORY — DX: Chronic diastolic (congestive) heart failure: I50.32

## 2014-01-21 HISTORY — DX: Paroxysmal atrial fibrillation: I48.0

## 2014-01-21 HISTORY — DX: Presence of cardiac pacemaker: Z95.0

## 2014-01-21 HISTORY — DX: Gastro-esophageal reflux disease without esophagitis: K21.9

## 2014-01-21 LAB — CBC
HCT: 37.6 % (ref 36.0–46.0)
Hemoglobin: 12.5 g/dL (ref 12.0–15.0)
MCH: 30.8 pg (ref 26.0–34.0)
MCHC: 33.2 g/dL (ref 30.0–36.0)
MCV: 92.6 fL (ref 78.0–100.0)
Platelets: 274 10*3/uL (ref 150–400)
RBC: 4.06 MIL/uL (ref 3.87–5.11)
RDW: 14 % (ref 11.5–15.5)
WBC: 8.2 10*3/uL (ref 4.0–10.5)

## 2014-01-21 LAB — COMPREHENSIVE METABOLIC PANEL
ALK PHOS: 164 U/L — AB (ref 39–117)
ALT: 79 U/L — ABNORMAL HIGH (ref 0–35)
AST: 70 U/L — AB (ref 0–37)
Albumin: 4.6 g/dL (ref 3.5–5.2)
Anion gap: 13 (ref 5–15)
BUN: 25 mg/dL — ABNORMAL HIGH (ref 6–23)
CHLORIDE: 105 meq/L (ref 96–112)
CO2: 25 mmol/L (ref 19–32)
CREATININE: 1.61 mg/dL — AB (ref 0.50–1.10)
Calcium: 10 mg/dL (ref 8.4–10.5)
GFR calc Af Amer: 36 mL/min — ABNORMAL LOW (ref >90)
GFR, EST NON AFRICAN AMERICAN: 31 mL/min — AB (ref >90)
GLUCOSE: 113 mg/dL — AB (ref 70–99)
Potassium: 3.6 mmol/L (ref 3.5–5.1)
SODIUM: 143 mmol/L (ref 135–145)
Total Bilirubin: 0.6 mg/dL (ref 0.3–1.2)
Total Protein: 7.3 g/dL (ref 6.0–8.3)

## 2014-01-21 LAB — BASIC METABOLIC PANEL
Anion gap: 15 (ref 5–15)
BUN: 27 mg/dL — ABNORMAL HIGH (ref 6–23)
CHLORIDE: 108 meq/L (ref 96–112)
CO2: 19 mmol/L (ref 19–32)
Calcium: 9.6 mg/dL (ref 8.4–10.5)
Creatinine, Ser: 1.4 mg/dL — ABNORMAL HIGH (ref 0.50–1.10)
GFR calc Af Amer: 42 mL/min — ABNORMAL LOW (ref >90)
GFR, EST NON AFRICAN AMERICAN: 37 mL/min — AB (ref >90)
Glucose, Bld: 112 mg/dL — ABNORMAL HIGH (ref 70–99)
POTASSIUM: 4 mmol/L (ref 3.5–5.1)
Sodium: 142 mmol/L (ref 135–145)

## 2014-01-21 LAB — PROTIME-INR
INR: 1.83 — ABNORMAL HIGH (ref 0.00–1.49)
INR: 1.33 (ref 0.00–1.49)
Prothrombin Time: 21.4 seconds — ABNORMAL HIGH (ref 11.6–15.2)
Prothrombin Time: 16.6 seconds — ABNORMAL HIGH (ref 11.6–15.2)

## 2014-01-21 LAB — APTT: APTT: 30 s (ref 24–37)

## 2014-01-21 LAB — I-STAT TROPONIN, ED: TROPONIN I, POC: 0 ng/mL (ref 0.00–0.08)

## 2014-01-21 LAB — TROPONIN I

## 2014-01-21 LAB — TSH: TSH: 2.783 u[IU]/mL (ref 0.350–4.500)

## 2014-01-21 LAB — MAGNESIUM: MAGNESIUM: 2.1 mg/dL (ref 1.5–2.5)

## 2014-01-21 LAB — BRAIN NATRIURETIC PEPTIDE
B Natriuretic Peptide: 415.6 pg/mL — ABNORMAL HIGH (ref 0.0–100.0)
B Natriuretic Peptide: 315.1 pg/mL — ABNORMAL HIGH (ref 0.0–100.0)

## 2014-01-21 MED ORDER — RIVAROXABAN 20 MG PO TABS: 20.0000 mg | ORAL_TABLET | Freq: Every day | ORAL | Status: DC

## 2014-01-21 MED ORDER — SODIUM CHLORIDE 0.9 % IJ SOLN: 3.0000 mL | INTRAMUSCULAR | Status: DC | PRN

## 2014-01-21 MED ORDER — DILTIAZEM HCL 100 MG IV SOLR
5.0000 mg/h | INTRAVENOUS | Status: DC
  Administered 2014-01-21: 10 mg/h via INTRAVENOUS
  Administered 2014-01-21: 15 mg/h via INTRAVENOUS
  Administered 2014-01-22: 5 mg/h via INTRAVENOUS
  Filled 2014-01-21 (×2): qty 100

## 2014-01-21 MED ORDER — DILTIAZEM LOAD VIA INFUSION
15.0000 mg | Freq: Once | INTRAVENOUS | Status: AC | PRN
  Filled 2014-01-21: qty 15

## 2014-01-21 MED ORDER — ONDANSETRON HCL 4 MG/2ML IJ SOLN: 4.0000 mg | Freq: Four times a day (QID) | INTRAMUSCULAR | Status: DC | PRN

## 2014-01-21 MED ORDER — IRBESARTAN 150 MG PO TABS
150.0000 mg | ORAL_TABLET | Freq: Every day | ORAL | Status: DC
  Administered 2014-01-21 – 2014-01-22 (×2): 150 mg via ORAL
  Filled 2014-01-21 (×2): qty 1

## 2014-01-21 MED ORDER — SOTALOL HCL 80 MG PO TABS
80.0000 mg | ORAL_TABLET | Freq: Two times a day (BID) | ORAL | Status: DC
  Administered 2014-01-21 – 2014-01-22 (×2): 80 mg via ORAL
  Filled 2014-01-21 (×4): qty 1

## 2014-01-21 MED ORDER — SODIUM CHLORIDE 0.9 % IV SOLN: 250.0000 mL | INTRAVENOUS | Status: DC

## 2014-01-21 MED ORDER — AMITRIPTYLINE HCL 25 MG PO TABS
25.0000 mg | ORAL_TABLET | Freq: Every day | ORAL | Status: DC
  Administered 2014-01-21: 25 mg via ORAL
  Filled 2014-01-21 (×2): qty 1

## 2014-01-21 MED ORDER — NEBIVOLOL HCL 10 MG PO TABS
10.0000 mg | ORAL_TABLET | Freq: Every day | ORAL | Status: DC
  Administered 2014-01-21 – 2014-01-22 (×2): 10 mg via ORAL
  Filled 2014-01-21 (×2): qty 1

## 2014-01-21 MED ORDER — EDOXABAN TOSYLATE 30 MG PO TABS
30.0000 mg | ORAL_TABLET | ORAL | Status: DC
  Administered 2014-01-21 – 2014-01-24 (×4): 30 mg via ORAL
  Filled 2014-01-21 (×5): qty 1

## 2014-01-21 MED ORDER — FUROSEMIDE 10 MG/ML IJ SOLN
40.0000 mg | Freq: Once | INTRAMUSCULAR | Status: AC
  Administered 2014-01-21: 40 mg via INTRAVENOUS
  Filled 2014-01-21: qty 4

## 2014-01-21 MED ORDER — DILTIAZEM LOAD VIA INFUSION
15.0000 mg | Freq: Once | INTRAVENOUS | Status: AC
Start: 2014-01-21 — End: 2014-01-21
  Filled 2014-01-21: qty 15

## 2014-01-21 MED ORDER — ACETAMINOPHEN 325 MG PO TABS
325.0000 mg | ORAL_TABLET | ORAL | Status: DC | PRN
  Administered 2014-01-21: 650 mg via ORAL
  Filled 2014-01-21: qty 2

## 2014-01-21 MED ORDER — PRAVASTATIN SODIUM 20 MG PO TABS
20.0000 mg | ORAL_TABLET | Freq: Every day | ORAL | Status: DC
  Administered 2014-01-21: 20 mg via ORAL
  Filled 2014-01-21 (×2): qty 1

## 2014-01-21 MED ORDER — SODIUM CHLORIDE 0.9 % IJ SOLN
3.0000 mL | Freq: Two times a day (BID) | INTRAMUSCULAR | Status: DC
  Administered 2014-01-22: 3 mL via INTRAVENOUS

## 2014-01-21 MED ORDER — SODIUM CHLORIDE 0.9 % IV SOLN
INTRAVENOUS | Status: DC
  Administered 2014-01-21: 16:00:00 via INTRAVENOUS

## 2014-01-21 NOTE — ED Notes (Signed)
Assisted to bathroom patient had increased sob after returning denies chest pain

## 2014-01-21 NOTE — ED Notes (Signed)
Pt up ambulatory to the bathroom at this time; visitor at bedside 

## 2014-01-21 NOTE — Progress Notes (Signed)
UR completed Skarlette Lattner K. Hrishikesh Hoeg, RN, BSN, MSHL, CCM  01/21/2014 3:47 PM

## 2014-01-21 NOTE — ED Notes (Signed)
Pt back to room from bathroom, placed on monitor, continuous pulse oximetry and blood pressure cuff; visitor at bedside

## 2014-01-21 NOTE — ED Notes (Signed)
Pt up ambulatory at this time to the bathroom

## 2014-01-21 NOTE — ED Notes (Signed)
Pt has returned from using the bathroom; pt placed back on monitor, continuous pulse oximetry and blood pressure cuff; visitor at bedside

## 2014-01-21 NOTE — Care Management Note (Addendum)
    Page 1 of 1   01/25/2014     11:17:25 AM CARE MANAGEMENT NOTE 01/25/2014  Patient:  Monica Harvey, Monica Harvey   Account Number:  [id number]  Date Initiated:  01/21/2014  Documentation initiated by:  [name]  Subjective/Objective Assessment:   Acute on chronice CHF, A-Fib, Tachycardia-bradycardia syndrome with underlying conduction system abnormality including left bundle branch block now with DDD pacemaker since December 2014., HTN     Action/Plan:   CM to follow for disposition needs   Anticipated DC Date:  01/24/2014   Anticipated DC Plan:  HOME/SELF CARE         Choice offered to / List presented to:             Status of service:  Completed, signed off Medicare Important Message given?  YES (If response is "NO", the following Medicare IM given date fields will be blank) Date Medicare IM given:  01/25/2014 Medicare IM given by:  Andrianna Manalang Date Additional Medicare IM given:   Additional Medicare IM given by:    Discharge Disposition:  HOME/SELF CARE  Per UR Regulation:  Reviewed for med. necessity/level of care/duration of stay  If discussed at Long Length of Stay Meetings, dates discussed:   01/26/2014    Comments:  [name] RN, BSN, MSHL, CCM  Nurse - Case Manager,  (Unit [facility]) [phone]  01/25/2014 Home/self care

## 2014-01-21 NOTE — Progress Notes (Signed)
Patient placed on cardiac monitor. CCMD notified of patient's arrival to unit.

## 2014-01-21 NOTE — ED Provider Notes (Signed)
CSN: [id number]     Arrival date & time 01/21/14  0537 History   First MD Initiated Contact with Patient 01/21/14 [phone]     Chief Complaint  Patient presents with  . Shortness of Breath     (Consider location/radiation/quality/duration/timing/severity/associated sxs/prior Treatment) HPI  Past Medical History  Diagnosis Date  . SSS (sick sinus syndrome)     with PPM-Medtronic placed 12/26/12   . Atrial fibrillation     remote H/O  . LBBB (left bundle branch block)   . Hyperlipidemia   . Symptomatic sinus bradycardia 12/23/2012  . First degree atrioventricular block 12/23/2012  . HTN (hypertension) 12/23/2012   Past Surgical History  Procedure Laterality Date  . Nm myocar perf wall motion  08/05/2009    small to mod. reversible anteroseptal,septal,& apical perfusion defect. EF 59%.  . Pacemaker insertion  12/26/12    Medtronic, Dr. Royann Shivers  . Permanent pacemaker insertion N/A 12/26/2012    Procedure: PERMANENT PACEMAKER INSERTION;  Surgeon: Thurmon Fair, MD;  Location: [facility]ATH [facility];  Service: Cardiovascular;  Laterality: N/A;   Family History  Problem Relation Age of Onset  . Heart attack Mother   . Stroke Sister    History  Substance Use Topics  . Smoking status: Never Smoker   . Smokeless tobacco: Never Used  . Alcohol Use: No   OB History    No data available     Review of Systems    Allergies  Codeine  Home Medications   Prior to Admission medications   Medication Sig Start Date End Date Taking? Authorizing Provider  acetaminophen (TYLENOL) 325 MG tablet Take 1-2 tablets (325-650 mg total) by mouth every 4 (four) hours as needed for mild pain. 12/27/12  Yes Luke K Kilroy, PA-C  amitriptyline (ELAVIL) 25 MG tablet TAKE 1 TABLET AT BEDTIME 12/04/13  Yes Mihai Croitoru, MD  edoxaban (SAVAYSA) 60 MG TABS tablet Take 60 mg by mouth daily. 08/18/13  Yes Mihai Croitoru, MD  nebivolol (BYSTOLIC) 10 MG tablet Take 1 tablet (10 mg total) by mouth daily. 07/14/13   Yes Mihai Croitoru, MD  Pitavastatin Calcium (LIVALO) 1 MG TABS Take 1 tablet (1 mg total) by mouth daily. 12/21/13  Yes Mihai Croitoru, MD  valsartan (DIOVAN) 160 MG tablet Take 1 tablet (160 mg total) by mouth daily. 12/21/13  Yes Mihai Croitoru, MD   BP 142/100 mmHg  Pulse 119  Temp(Src) 97.7 F (36.5 C) (Oral)  Resp 22  Ht 5\' 3"  (1.6 m)  Wt 150 lb (68.04 kg)  BMI 26.58 kg/m2  SpO2 96% Physical Exam  ED Course  Procedures (including critical care time)  Medications  diltiazem (CARDIZEM) 1 mg/mL load via infusion 15 mg (not administered)    And  diltiazem (CARDIZEM) 100 mg in dextrose 5 % 100 mL (1 mg/mL) infusion (15 mg/hr Intravenous Bolus from Bag 01/21/14 0713)  furosemide (LASIX) injection 40 mg (40 mg Intravenous Given 01/21/14 0707)    Patient Vitals for the past 24 hrs:  BP Temp Temp src Pulse Resp SpO2 Height Weight  01/21/14 0727 142/100 mmHg - - 119 22 96 % - -  01/21/14 0654 (!) 140/124 mmHg - - (!) 128 22 97 % - -  01/21/14 0548 - 97.7 F (36.5 C) Oral (!) 135 24 94 % 5\' 3"  (1.6 m) 150 lb (68.04 kg)    8:12 AM Reevaluation with update and discussion. After initial assessment and treatment, an updated evaluation reveals she is somewhat more comfortable now.  She  has voided.  She remains tachycardic with normal blood pressure. Nicasio Barlowe L    08:05- case discussed with cardiology, will evaluate the patient in the ED for further treatment and likely admission.  CRITICAL CARE Performed by: Flint Melter Total critical care time: 35 minutes Critical care time was exclusive of separately billable procedures and treating other patients. Critical care was necessary to treat or prevent imminent or life-threatening deterioration. Critical care was time spent personally by me on the following activities: development of treatment plan with patient and/or surrogate as well as nursing, discussions with consultants, evaluation of patient's response to treatment,  examination of patient, obtaining history from patient or surrogate, ordering and performing treatments and interventions, ordering and review of laboratory studies, ordering and review of radiographic studies, pulse oximetry and re-evaluation of patient's condition.   [place] score- 1 (HTN), on Savaysa  Labs Review Labs Reviewed  BRAIN NATRIURETIC PEPTIDE - Abnormal; Notable for the following:    B Natriuretic Peptide 415.6 (*)    All other components within normal limits  BASIC METABOLIC PANEL - Abnormal; Notable for the following:    Glucose, Bld 112 (*)    BUN 27 (*)    Creatinine, Ser 1.40 (*)    GFR calc non Af Amer 37 (*)    GFR calc Af Amer 42 (*)    All other components within normal limits  PROTIME-INR - Abnormal; Notable for the following:    Prothrombin Time 21.4 (*)    INR 1.83 (*)    All other components within normal limits  CBC  I-STAT TROPOININ, ED    Imaging Review Dg Chest Port 1 View  01/21/2014   CLINICAL DATA:  Acute onset of shortness of breath. Initial encounter.  EXAM: PORTABLE CHEST - 1 VIEW  COMPARISON:  Chest radiograph performed 12/27/2012  FINDINGS: The lungs are well expanded. Vascular congestion is noted, with bibasilar airspace opacities, concerning for pulmonary edema. No definite pleural effusion or pneumothorax is seen.  The cardiomediastinal silhouette is borderline normal in size. A pacemaker is noted overlying the left chest wall, with leads ending overlying the right atrium and right ventricle. No acute osseous abnormalities are seen.  IMPRESSION: Vascular congestion, with bibasilar airspace opacities, concerning for pulmonary edema.   Electronically Signed   By: Roanna Raider M.D.   On: 01/21/2014 06:40     EKG Interpretation   Date/Time:  Thursday January 21 2014 05:46:24 EST Ventricular Rate:  136 PR Interval:    QRS Duration: 137 QT Interval:  329 QTC Calculation: 495 R Axis:   86 Text Interpretation:  Atrial fibrillation Left bundle  branch block Since  last tracing Confirmed by Damonie Ellenwood  MD, Neilah Fulwider ([id number]) on 01/21/2014 6:28:18  AM      MDM   Final diagnoses:  Atrial fibrillation with RVR    Atrial fibrillation, with underlying pacemaker.  Currently, her heart rate is uncontrolled.  No specific cause noted for the current problem.  She is currently taking Savaysa.  He will require admission and evaluation and treatment by her cardiologist.  Nursing Notes Reviewed/ Care Coordinated, and agree without changes. Applicable Imaging Reviewed.  Interpretation of Laboratory Data incorporated into ED treatment  Plan: Admit    Flint Melter, MD 01/21/14 [phone]

## 2014-01-21 NOTE — H&P (Addendum)
Monica Harvey is a 74 y.o. female  Admit Date: 01/21/2014 Referring Physician: Emergency room Primary Cardiologist:: Rosezetta Schlatter, MD Chief complaint / reason for admission: Dyspnea  HPI: 74 year old female with a history of left bundle branch block, tachycardia-bradycardia syndrome, and prior history of paroxysmal atrial fibrillation for which she has been treated with flecainide until proximally one year ago. She received a pacemaker at that time and flecainide was discontinued. She has had very low burden of atrial fibrillation on pacemaker interrogation and has been simply treated with beta blocker therapy. She notes fatigue and progressive dyspnea over the past 2 weeks. Over the past 12 hours she has developed alarming shortness of breath and orthopnea. She came to the emergency room early this morning complaining that he was very difficult to breathe. At that time she was noted to be in atrial fib with a rapid ventricular response. She is been given IV Lasix and now feels considerably better. She was not aware of palpitations or any other complaint than dyspnea. She denies chest pain. There is no history of coronary disease.    PMH:    Past Medical History  Diagnosis Date  . SSS (sick sinus syndrome)     with PPM-Medtronic placed 12/26/12   . Atrial fibrillation     remote H/O  . LBBB (left bundle branch block)   . Hyperlipidemia   . Symptomatic sinus bradycardia 12/23/2012  . First degree atrioventricular block 12/23/2012  . HTN (hypertension) 12/23/2012    PSH:    Past Surgical History  Procedure Laterality Date  . Nm myocar perf wall motion  08/05/2009    small to mod. reversible anteroseptal,septal,& apical perfusion defect. EF 59%.  . Pacemaker insertion  12/26/12    Medtronic, Dr. Royann Shivers  . Permanent pacemaker insertion N/A 12/26/2012    Procedure: PERMANENT PACEMAKER INSERTION;  Surgeon: Thurmon Fair, MD;  Location: [facility]ATH [facility];  Service: Cardiovascular;   Laterality: N/A;   ALLERGIES:   Codeine Prior to Admit Meds:     Medication List    ASK your doctor about these medications        acetaminophen 325 MG tablet  Commonly known as:  TYLENOL  Take 1-2 tablets (325-650 mg total) by mouth every 4 (four) hours as needed for mild pain.     amitriptyline 25 MG tablet  Commonly known as:  ELAVIL  TAKE 1 TABLET AT BEDTIME     edoxaban 60 MG Tabs tablet  Commonly known as:  SAVAYSA  Take 60 mg by mouth daily.     nebivolol 10 MG tablet  Commonly known as:  BYSTOLIC  Take 1 tablet (10 mg total) by mouth daily.     Pitavastatin Calcium 1 MG Tabs  Commonly known as:  LIVALO  Take 1 tablet (1 mg total) by mouth daily.     valsartan 160 MG tablet  Commonly known as:  DIOVAN  Take 1 tablet (160 mg total) by mouth daily.         Family HX:    Family History  Problem Relation Age of Onset  . Heart attack Mother   . Stroke Sister    Social HX:    History   Social History  . Marital Status: Married    Spouse Name: N/A    Number of Children: N/A  . Years of Education: N/A   Occupational History  . Not on file.   Social History Main Topics  . Smoking status: Never Smoker   .  Smokeless tobacco: Never Used  . Alcohol Use: No  . Drug Use: No  . Sexual Activity: Not Currently   Other Topics Concern  . Not on file   Social History Narrative     ROS She denies transient neurological symptoms. She has had lower extremity swelling. She denies anginal quality chest pain. She denies syncope. No blood in the urine or stool. No recent head trauma. Appetite is been stable. Sleep pattern has been difficult and interrupted because of dyspnea over the past 3-4 days. Appetite is been stable. No chills or fever. Physical Exam: Blood pressure 128/76, pulse 108, temperature 97.7 F (36.5 C), temperature source Oral, resp. rate 18, height  (1.6 m), weight 150 lb (68.04 kg), SpO2 94 %.    Pleasant elderly female in no acute  distress. Skin is clear without jaundice or pallor Neck exam reveals no JVD or carotid bruit. Neck exam is done with patient lying at 45 on the hospital gurney Chest reveals diminished breath sounds at the bases bilateral. No wheezing or rales are heard Cardiac exam reveals an irregularly irregular rapid rhythm. No murmur or gallop is heard. Abdomen isn't distended. Abdomen is nontender. Extremities reveal trace bilateral 1+ edema. Radial pulses and posterior tibial pulses are palpable. Neurological exam reveals no focal cranial nerve or motor deficit. Psychiatric evaluation is intact.  Labs: Lab Results  Component Value Date   WBC 8.2 01/21/2014   HGB 12.5 01/21/2014   HCT 37.6 01/21/2014   MCV 92.6 01/21/2014   PLT 274 01/21/2014    Recent Labs Lab 01/21/14 0603  NA 142  K 4.0  CL 108  CO2 19  BUN 27*  CREATININE 1.40*  CALCIUM 9.6  GLUCOSE 112*     Radiology:  PA and lateral chest x-ray 01/21/2014 IMPRESSION: Vascular congestion, with bibasilar airspace opacities, concerning for pulmonary edema.  EKG:  Atrial fibrillation with rapid rate and left bundle branch block  ASSESSMENT:  1. Acute on chronic combined systolic and diastolic heart failure. I believe this is precipitated by atrial fibrillation. Underlying LV systolic function is not been evaluated in 2 years. We need to exclude the possibility of decreasing systolic function. In this setting there could be a tachycardia mediated process involved.  2. Atrial fibrillation, previously paroxysmal. Unknown current duration, but based upon history perhaps present as long as 2 weeks. Pacemaker interrogation would help [place] sort this out. CHADS-VASC is 4.  3. Tachycardia-bradycardia syndrome with underlying conduction system abnormality including left bundle branch block now with DDD pacemaker since December 2014.  4. Essential hypertension   Plan:  1. Rate control with IV diltiazem 2. Continue anticoagulation with  Edoxaban 3. 2-D Doppler echocardiogram to re-document systolic function 4. Consider TEE cardioversion in a.m. depending upon LV function 5. Will likely require reinstitution of antiarrhythmic therapy to control rhythm (need to determine which agent. I would favor amiodarone at her age but could use sotalol, Tikosyn, etc.) 6. IV diuresis to clear heart failure  Lesleigh Noe 01/21/2014 11:54 AM

## 2014-01-21 NOTE — ED Notes (Signed)
Patient states she started getting sob 2 days ago got worse yest. States she used an inhaler and last pm states she was wheezing and used a family members neb. Machine. These were not her medicine. States she just feels sob

## 2014-01-21 NOTE — Progress Notes (Signed)
Patient has arrived to unit. Patient alert and oriented x4 and appears to be in no distress. Will continue to monitor.

## 2014-01-22 ENCOUNTER — Inpatient Hospital Stay (HOSPITAL_COMMUNITY): Payer: Medicare Other | Admitting: Critical Care Medicine

## 2014-01-22 ENCOUNTER — Encounter (HOSPITAL_COMMUNITY)
Admission: EM | Disposition: A | Discharge status: Home / Self Care | Payer: Self-pay | Source: Home / Self Care | Attending: Interventional Cardiology

## 2014-01-22 ENCOUNTER — Encounter (HOSPITAL_COMMUNITY): Payer: Self-pay | Admitting: Critical Care Medicine

## 2014-01-22 DIAGNOSIS — I429 Cardiomyopathy, unspecified: Secondary | ICD-10-CM

## 2014-01-22 DIAGNOSIS — I4891 Unspecified atrial fibrillation: Secondary | ICD-10-CM

## 2014-01-22 HISTORY — PX: CARDIOVERSION: SHX1299

## 2014-01-22 LAB — TROPONIN I

## 2014-01-22 LAB — LIPID PANEL
CHOL/HDL RATIO: 2.9 ratio
Cholesterol: 121 mg/dL (ref 0–200)
HDL: 42 mg/dL (ref >39)
LDL Cholesterol: 60 mg/dL (ref 0–99)
Triglycerides: 94 mg/dL (ref <150)
VLDL: 19 mg/dL (ref 0–40)

## 2014-01-22 LAB — CBC
HCT: 34.3 % — ABNORMAL LOW (ref 36.0–46.0)
Hemoglobin: 11.6 g/dL — ABNORMAL LOW (ref 12.0–15.0)
MCH: 30.9 pg (ref 26.0–34.0)
MCHC: 33.8 g/dL (ref 30.0–36.0)
MCV: 91.5 fL (ref 78.0–100.0)
Platelets: 273 10*3/uL (ref 150–400)
RBC: 3.75 MIL/uL — AB (ref 3.87–5.11)
RDW: 13.9 % (ref 11.5–15.5)
WBC: 6.1 10*3/uL (ref 4.0–10.5)

## 2014-01-22 LAB — HEMOGLOBIN A1C
Hgb A1c MFr Bld: 5.4 % (ref <5.7)
MEAN PLASMA GLUCOSE: 108 mg/dL (ref <117)

## 2014-01-22 LAB — BASIC METABOLIC PANEL
ANION GAP: 7 (ref 5–15)
BUN: 27 mg/dL — ABNORMAL HIGH (ref 6–23)
CHLORIDE: 107 meq/L (ref 96–112)
CO2: 24 mmol/L (ref 19–32)
Calcium: 9 mg/dL (ref 8.4–10.5)
Creatinine, Ser: 1.49 mg/dL — ABNORMAL HIGH (ref 0.50–1.10)
GFR calc Af Amer: 39 mL/min — ABNORMAL LOW (ref >90)
GFR, EST NON AFRICAN AMERICAN: 34 mL/min — AB (ref >90)
GLUCOSE: 102 mg/dL — AB (ref 70–99)
POTASSIUM: 3.7 mmol/L (ref 3.5–5.1)
Sodium: 138 mmol/L (ref 135–145)

## 2014-01-22 SURGERY — CARDIOVERSION
Anesthesia: Monitor Anesthesia Care

## 2014-01-22 MED ORDER — SODIUM CHLORIDE 0.9 % IV BOLUS (SEPSIS)
500.0000 mL | Freq: Once | INTRAVENOUS | Status: AC
  Administered 2014-01-22: 500 mL via INTRAVENOUS

## 2014-01-22 MED ORDER — PROMETHAZINE HCL 25 MG/ML IJ SOLN: 6.2500 mg | INTRAMUSCULAR | Status: DC | PRN

## 2014-01-22 MED ORDER — LIDOCAINE HCL (CARDIAC) 20 MG/ML IV SOLN
INTRAVENOUS | Status: DC | PRN
  Administered 2014-01-22: 40 mg via INTRAVENOUS

## 2014-01-22 MED ORDER — SOTALOL HCL 80 MG PO TABS: 80.0000 mg | ORAL_TABLET | Freq: Two times a day (BID) | ORAL | Status: DC

## 2014-01-22 MED ORDER — PROPOFOL 10 MG/ML IV BOLUS
INTRAVENOUS | Status: DC | PRN
Start: 2014-01-22 — End: 2014-01-22
  Administered 2014-01-22: 20 mg via INTRAVENOUS
  Administered 2014-01-22: 40 mg via INTRAVENOUS

## 2014-01-22 MED ORDER — FENTANYL CITRATE 0.05 MG/ML IJ SOLN: 25.0000 ug | INTRAMUSCULAR | Status: DC | PRN

## 2014-01-22 MED ORDER — AMITRIPTYLINE HCL 25 MG PO TABS
25.0000 mg | ORAL_TABLET | Freq: Every day | ORAL | Status: DC
  Administered 2014-01-22 – 2014-01-24 (×3): 25 mg via ORAL
  Filled 2014-01-22 (×4): qty 1

## 2014-01-22 MED ORDER — OFF THE BEAT BOOK
Freq: Once | Status: AC
  Administered 2014-01-22: 11:00:00
  Filled 2014-01-22: qty 1

## 2014-01-22 MED ORDER — SOTALOL HCL 80 MG PO TABS
80.0000 mg | ORAL_TABLET | Freq: Two times a day (BID) | ORAL | Status: DC
  Administered 2014-01-22 – 2014-01-24 (×5): 80 mg via ORAL
  Filled 2014-01-22 (×7): qty 1

## 2014-01-22 MED ORDER — MEPERIDINE HCL 25 MG/ML IJ SOLN: 6.2500 mg | INTRAMUSCULAR | Status: DC | PRN

## 2014-01-22 MED ORDER — TRAZODONE HCL 50 MG PO TABS
50.0000 mg | ORAL_TABLET | Freq: Once | ORAL | Status: AC
  Administered 2014-01-22: 50 mg via ORAL
  Filled 2014-01-22: qty 1

## 2014-01-22 NOTE — Anesthesia Procedure Notes (Signed)
Date/Time: 01/22/2014 9:02 AM Performed by: Elon Alas Pre-anesthesia Checklist: Emergency Drugs available, Suction available, Timeout performed, Patient being monitored and Patient identified Patient Re-evaluated:Patient Re-evaluated prior to inductionOxygen Delivery Method: Ambu bag Preoxygenation: Pre-oxygenation with 100% oxygen Intubation Type: IV induction Placement Confirmation: breath sounds checked- equal and bilateral Dental Injury: Teeth and Oropharynx as per pre-operative assessment

## 2014-01-22 NOTE — Progress Notes (Signed)
SUBJECTIVE:  No complaints  OBJECTIVE:   Vitals:   Filed Vitals:   01/22/14 0915 01/22/14 0920 01/22/14 0942 01/22/14 1136  BP: 107/76 108/60 99/87 117/67  Pulse: 61 64 70 60  Temp:   97.5 F (36.4 C)   TempSrc:   Oral   Resp: 21 17    Height:      Weight:      SpO2: 95% 92% 100%    I&O's:   Intake/Output Summary (Last 24 hours) at 01/22/14 1457 Last data filed at 01/22/14 1413  Gross per 24 hour  Intake 1461.78 ml  Output   1375 ml  Net  86.78 ml   TELEMETRY: Reviewed telemetry pt in NSR     PHYSICAL EXAM General: Well developed, well nourished, in no acute distress Head: Eyes PERRLA, No xanthomas.   Normal cephalic and atramatic  Lungs:   Clear bilaterally to auscultation and percussion. Heart:   HRRR S1 S2 Pulses are 2+ & equal. Abdomen: Bowel sounds are positive, abdomen soft and non-tender without masses Extremities:   No clubbing, cyanosis or edema.  DP +1 Neuro: Alert and oriented X 3. Psych:  Good affect, responds appropriately   LABS: Basic Metabolic Panel:  Recent Labs  51/88/41 1605 01/22/14 0426  NA 143 138  K 3.6 3.7  CL 105 107  CO2 25 24  GLUCOSE 113* 102*  BUN 25* 27*  CREATININE 1.61* 1.49*  CALCIUM 10.0 9.0  MG 2.1  --    Liver Function Tests:  Recent Labs  01/21/14 1605  AST 70*  ALT 79*  ALKPHOS 164*  BILITOT 0.6  PROT 7.3  ALBUMIN 4.6   No results for input(s): LIPASE, AMYLASE in the last 72 hours. CBC:  Recent Labs  01/21/14 0603 01/22/14 0426  WBC 8.2 6.1  HGB 12.5 11.6*  HCT 37.6 34.3*  MCV 92.6 91.5  PLT 274 273   Cardiac Enzymes:  Recent Labs  01/21/14 1605 01/21/14 2100 01/22/14 0426  TROPONINI <0.03 <0.03 <0.03   BNP: Invalid input(s): POCBNP D-Dimer: No results for input(s): DDIMER in the last 72 hours. Hemoglobin A1C:  Recent Labs  01/21/14 1605  HGBA1C 5.4   Fasting Lipid Panel:  Recent Labs  01/22/14 0426  CHOL 121  HDL 42  LDLCALC 60  TRIG 94  CHOLHDL 2.9   Thyroid  Function Tests:  Recent Labs  01/21/14 1605  TSH 2.783   Anemia Panel: No results for input(s): VITAMINB12, FOLATE, FERRITIN, TIBC, IRON, RETICCTPCT in the last 72 hours. Coag Panel:   Lab Results  Component Value Date   INR 1.33 01/21/2014   INR 1.83* 01/21/2014   INR 0.88 12/23/2012    RADIOLOGY: Dg Chest Port 1 View  01/21/2014   CLINICAL DATA:  Acute onset of shortness of breath. Initial encounter.  EXAM: PORTABLE CHEST - 1 VIEW  COMPARISON:  Chest radiograph performed 12/27/2012  FINDINGS: The lungs are well expanded. Vascular congestion is noted, with bibasilar airspace opacities, concerning for pulmonary edema. No definite pleural effusion or pneumothorax is seen.  The cardiomediastinal silhouette is borderline normal in size. A pacemaker is noted overlying the left chest wall, with leads ending overlying the right atrium and right ventricle. No acute osseous abnormalities are seen.  IMPRESSION: Vascular congestion, with bibasilar airspace opacities, concerning for pulmonary edema.   Electronically Signed   By: Roanna Raider M.D.   On: 01/21/2014 06:40    ASSESSMENT:  1. Acute on chronic combined systolic and diastolic heart failure.  Most likely precipitated by atrial fibrillation. Underlying LV systolic function is not been evaluated in 2 years. We need to exclude the possibility of decreasing systolic function. In this setting there could be a tachycardia mediated process involved.  2. Atrial fibrillation, previously paroxysmal. Unknown current duration, but based upon history perhaps present as long as 2 weeks. Pacemaker interrogation would help [place] sort this out. CHADS-VASC is 4.  She has been on Savaysa for some time.  She underwent DCCV earlier today and is maintaining NSR.   3. Tachycardia-bradycardia syndrome with underlying conduction system abnormality including left bundle branch block now with DDD pacemaker since December 2014.  4. Essential hypertension - BP  soft  Plan: 1.  Continue anticoagulation with Edoxaban 2.  2-D Doppler echocardiogram to re-document systolic function 3. Per Dr. Katrinka Blazing - he started her on Betapace  BID last night and will continue.  Will need to follow QTc 4.  Her lungs are clear and no edema so will hold on diuretics today and reassess in the am    Quintella Reichert, MD  01/22/2014  2:57 PM

## 2014-01-22 NOTE — Anesthesia Postprocedure Evaluation (Signed)
  Anesthesia Post-op Note  Patient: Monica Harvey  Procedure(s) Performed: Procedure(s): CARDIOVERSION (N/A)  Patient Location: Endoscopy Unit  Anesthesia Type:MAC  Level of Consciousness: awake  Airway and Oxygen Therapy: Patient Spontanous Breathing  Post-op Pain: none  Post-op Assessment: Post-op Vital signs reviewed, Patient's Cardiovascular Status Stable, Respiratory Function Stable, Patent Airway and No signs of Nausea or vomiting  Post-op Vital Signs: Reviewed and stable  Last Vitals:  Filed Vitals:   01/22/14 0836  BP: 125/58  Pulse: 82  Temp: 36.9 C  Resp: 19    Complications: No apparent anesthesia complications

## 2014-01-22 NOTE — Progress Notes (Signed)
Spoke with Dr. Gae Gallop and notified of patient's low blood pressure and the hypotensive episode she had last night after giving sotalol. Blood pressure 110/60 manually. Orders given to administer sotalol and to give bolus of normal saline per his order. Will carry out orders and continue to monitor patient to end of shift.

## 2014-01-22 NOTE — Anesthesia Preprocedure Evaluation (Addendum)
Anesthesia Evaluation  Patient identified by MRN, date of birth, ID band Patient awake    Reviewed: Allergy & Precautions, NPO status , reviewed documented beta blocker date and time   Airway Mallampati: II  TM Distance: >3 FB Neck ROM: Full    Dental  (+) Dental Advisory Given, Teeth Intact   Pulmonary  breath sounds clear to auscultation        Cardiovascular hypertension, Pt. on medications + angina + dysrhythmias Atrial Fibrillation + pacemaker Rhythm:Irregular  ECHO 2014 EF 45%   Neuro/Psych    GI/Hepatic Neg liver ROS, GERD-  Medicated,  Endo/Other    Renal/GU Renal InsufficiencyRenal diseaseGFR 34     Musculoskeletal   Abdominal (+)  Abdomen: soft.    Peds  Hematology negative hematology ROS (+)   Anesthesia Other Findings   Reproductive/Obstetrics                          Anesthesia Physical Anesthesia Plan  ASA: III  Anesthesia Plan: MAC   Post-op Pain Management:    Induction: Intravenous  Airway Management Planned: Nasal Cannula  Additional Equipment:   Intra-op Plan:   Post-operative Plan:   Informed Consent: I have reviewed the patients History and Physical, chart, labs and discussed the procedure including the risks, benefits and alternatives for the proposed anesthesia with the patient or authorized representative who has indicated his/her understanding and acceptance.     Plan Discussed with:   Anesthesia Plan Comments:         Anesthesia Quick Evaluation

## 2014-01-22 NOTE — Anesthesia Postprocedure Evaluation (Signed)
  Anesthesia Post-op Note  Patient: Monica Harvey  Procedure(s) Performed: Procedure(s): CARDIOVERSION (N/A)  Patient Location: PACU  Anesthesia Type:MAC  Level of Consciousness: awake and alert   Airway and Oxygen Therapy: Patient Spontanous Breathing  Post-op Pain: none  Post-op Assessment: Post-op Vital signs reviewed  Post-op Vital Signs: Reviewed  Last Vitals:  Filed Vitals:   01/22/14 0836  BP: 125/58  Pulse: 82  Temp: 36.9 C  Resp: 19    Complications: No apparent anesthesia complications

## 2014-01-22 NOTE — Clinical Documentation Improvement (Signed)
Supporting Information: Abnormal Labs: Bun: 01/14:  27 01/14:  25 01/15:  27 Creat: 01/14: 1.40 01/14: 1.61 01/15: 1.49 GFR:   01/14:  37 01/14:  31 01/15:  34    Possible Clinical Conditions: --Acute Renal Failure . Document underlying condition(s) contributing/causing acute renal failure if known or suspected . Document if acute kidney injury (AKI) is due to traumatic injury or if due to a non-traumatic event . Document if acute renal failure is due to: --Acute tubular necrosis (ATN) --Acute cortical necrosis --Acute medullary necrosis --Other (specify) . Be specific with documentation --Acute renal insufficiency and acute kidney disease are not reported as acute renal failure . Document any associated diagnoses/conditions    Thank Gabriel Cirri Documentation Specialist [phone] Taksh [email or web address]

## 2014-01-22 NOTE — H&P (Signed)
     INTERVAL PROCEDURE H&P  History and Physical Interval Note:  01/22/2014 8:53 AM  Monica Harvey has presented today for their planned procedure. The various methods of treatment have been discussed with the patient and family. After consideration of risks, benefits and other options for treatment, the patient has consented to the procedure.  The patients' outpatient history has been reviewed, patient examined, and no change in status from most recent office note within the past 30 days. I have reviewed the patients' chart and labs and will proceed as planned. Questions were answered to the patient's satisfaction.   Chrystie Nose, MD, [facility] Attending Cardiologist [facility]  HILTY,Kenneth C 01/22/2014, 8:53 AM

## 2014-01-22 NOTE — CV Procedure (Signed)
    CARDIOVERSION NOTE  Procedure: Electrical Cardioversion Indications:  Atrial Fibrillation  Procedure Details:  Consent: Risks of procedure as well as the alternatives and risks of each were explained to the (patient/caregiver).  Consent for procedure obtained.  Time Out: Verified patient identification, verified procedure, site/side was marked, verified correct patient position, special equipment/implants available, medications/allergies/relevent history reviewed, required imaging and test results available.  Performed  Patient placed on cardiac monitor, pulse oximetry, supplemental oxygen as necessary.  Sedation given: Propofol per anesthesia Pacer pads placed anterior and posterior chest.  Cardioverted 1 time(s).  Cardioverted at 150J biphasic.  Impression: Findings: Post procedure EKG shows: atrial paced rhythm Complications: None Patient did tolerate procedure well.  Plan: 1. Successful DCCV to a-paced rhythm. Normal AV conduction.  2. Pacer reprogrammed for atrial overdrive features.  Time Spent Directly with the Patient:  15 minutes   Chrystie Nose, MD, [facility] Attending Cardiologist [facility]  Talvin Christianson C 01/22/2014, 9:07 AM

## 2014-01-22 NOTE — Transfer of Care (Signed)
Immediate Anesthesia Transfer of Care Note  Patient: Monica Harvey  Procedure(s) Performed: Procedure(s): CARDIOVERSION (N/A)  Patient Location: Endoscopy Unit  Anesthesia Type:MAC  Level of Consciousness: awake, alert  and oriented  Airway & Oxygen Therapy: Patient Spontanous Breathing and Patient connected to nasal cannula oxygen  Post-op Assessment: Report given to PACU RN, Post -op Vital signs reviewed and stable and Patient moving all extremities X 4  Post vital signs: Reviewed and stable  Complications: No apparent anesthesia complications

## 2014-01-22 NOTE — Progress Notes (Signed)
Notified on-call physician Dr. Allena Katz that patient was requesting a sleep aid for she was feeling restless. Orders given for one time dose Trazodone. Medication given per order. Will continue to monitor to end of shift.

## 2014-01-22 NOTE — Progress Notes (Signed)
Paged and notified Dr. Allena Katz of having to stop Cardizem drip for patient's blood pressure is was low systolic 86/52 heart rate in low 60's. Rechecked blood pressure manually and was 92/60. Dr. Allena Katz instructed was ok to to stop Cardizem drip and day shift doctors will address medication orders of Cardizem when making rounds. Patient stated at first was a little lightheaded but after sitting on the side of bed for a while, patient states she feels ok. Will continue to monitor patient to end of shift.

## 2014-01-23 DIAGNOSIS — I359 Nonrheumatic aortic valve disorder, unspecified: Secondary | ICD-10-CM

## 2014-01-23 LAB — BASIC METABOLIC PANEL
Anion gap: 6 (ref 5–15)
BUN: 21 mg/dL (ref 6–23)
CO2: 26 mmol/L (ref 19–32)
Calcium: 9.7 mg/dL (ref 8.4–10.5)
Chloride: 107 mEq/L (ref 96–112)
Creatinine, Ser: 1.2 mg/dL — ABNORMAL HIGH (ref 0.50–1.10)
GFR calc non Af Amer: 44 mL/min — ABNORMAL LOW (ref >90)
GFR, EST AFRICAN AMERICAN: 51 mL/min — AB (ref >90)
GLUCOSE: 90 mg/dL (ref 70–99)
Potassium: 3.9 mmol/L (ref 3.5–5.1)
Sodium: 139 mmol/L (ref 135–145)

## 2014-01-23 LAB — GLUCOSE, CAPILLARY: Glucose-Capillary: 90 mg/dL (ref 70–99)

## 2014-01-23 NOTE — Progress Notes (Signed)
  Echocardiogram 2D Echocardiogram has been performed.  Monica Harvey 01/23/2014, 2:38 PM

## 2014-01-23 NOTE — Progress Notes (Signed)
Subjective:  Says she feels much better today.  Not short of breath.  No chest pain.  Recurrent arrhythmias.  Objective:  Vital Signs in the last 24 hours: BP 112/68 mmHg  Pulse 70  Temp(Src) 98 F (36.7 C) (Oral)  Resp 18  Ht 5\' 3"  (1.6 m)  Wt 71.759 kg (158 lb 3.2 oz)  BMI 28.03 kg/m2  SpO2 98%  Physical Exam: Pleasant mildly obese female in no acute distress Lungs:  Clear  Cardiac:  Regular rhythm, normal S1 and S2, no S3 Extremities:  No edema present  Intake/Output from previous day: 01/15 0701 - 01/16 0700 In: 1603 [P.O.:1200; I.V.:153; IV Piggyback:250] Out: 775 [Urine:775] Weight Filed Weights   01/21/14 1519 01/22/14 0543 01/23/14 0523  Weight: 69.945 kg (154 lb 3.2 oz) 70.7 kg (155 lb 13.8 oz) 71.759 kg (158 lb 3.2 oz)    Lab Results: Basic Metabolic Panel:  Recent Labs  33/43/56 1605 01/22/14 0426  NA 143 138  K 3.6 3.7  CL 105 107  CO2 25 24  GLUCOSE 113* 102*  BUN 25* 27*  CREATININE 1.61* 1.49*    CBC:  Recent Labs  01/21/14 0603 01/22/14 0426  WBC 8.2 6.1  HGB 12.5 11.6*  HCT 37.6 34.3*  MCV 92.6 91.5  PLT 274 273   Telemetry: Sinus rhythm  Assessment/Plan:  1.  Acute on chronic systolic and diastolic heart failure-clinically much better today. 2.  Paroxysmal atrial fibrillation, maintaining sinus rhythm. 3.  Functioning permanent pacemaker. 4.  Long-term anticoagulation. 5.  Sotalol load.  Recommendations:  Check to see if echo will be done today.  Continue sotalol loading.     Darden Palmer  MD [facility] Cardiology  01/23/2014, 12:43 PM

## 2014-01-24 DIAGNOSIS — N183 Chronic kidney disease, stage 3 unspecified: Secondary | ICD-10-CM | POA: Diagnosis present

## 2014-01-24 NOTE — Progress Notes (Signed)
Patient denies chest pain and is sleeping peacefully. Maintained on telemetry and is A-paced with a heart rate of 67. Will continue to monitor.  Sharlene Dory, RN

## 2014-01-24 NOTE — Progress Notes (Signed)
Patient sleeping peacefully. Maintained on telemetry and is A-paced on the monitorwith a heart rate of 63. Will continue to monitor.  Sharlene Dory, RN

## 2014-01-24 NOTE — Progress Notes (Signed)
    Subjective:  She feels much better since she has diuresed and is in NSR  Objective:  Vital Signs in the last 24 hours: Temp:  [97.6 F (36.4 C)-98.2 F (36.8 C)] 97.6 F (36.4 C) (01/17 0518) Pulse Rate:  [65-80] 80 (01/17 0518) Resp:  [18-20] 18 (01/17 0518) BP: (102-121)/(60-62) 121/60 mmHg (01/17 0518) SpO2:  [95 %-100 %] 100 % (01/17 0518) Weight:  [157 lb 3.7 oz (71.319 kg)] 157 lb 3.7 oz (71.319 kg) (01/17 0518)  Intake/Output from previous day:  Intake/Output Summary (Last 24 hours) at 01/24/14 0802 Last data filed at 01/24/14 0300  Gross per 24 hour  Intake    960 ml  Output    600 ml  Net    360 ml    Physical Exam: General appearance: alert, cooperative and no distress Lungs: clear to auscultation bilaterally Heart: regular rate and rhythm Extremities: no edema   Rate: 75  Rhythm: Paced, QTc 535 on telemetry  Lab Results:  Recent Labs  01/22/14 0426  WBC 6.1  HGB 11.6*  PLT 273    Recent Labs  01/22/14 0426 01/23/14 1300  NA 138 139  K 3.7 3.9  CL 107 107  CO2 24 26  GLUCOSE 102* 90  BUN 27* 21  CREATININE 1.49* 1.20*    Recent Labs  01/21/14 2100 01/22/14 0426  TROPONINI <0.03 <0.03    Recent Labs  01/21/14 1605  INR 1.33    Imaging: Imaging results have been reviewed  Cardiac Studies:  Assessment/Plan:  Pleasant 74 y/o female followed by Dr Royann Shivers with PAF and tachy-brady syndrome, s/p MDT PTVDP Dec 2014. She was admitted 01/21/14 with acute on chronic combined systolic and diastolic heart failure. This was most likely precipitated by atrial fibrillation. Her atrial fibrillation has previously been paroxysmal. The current duration of this episode is unkown, but based upon history perhaps present as long as 2 weeks. Previously she had a low burden of AF. Her CHADS-VASC is 4, she has been on Savaysa as an OP. She was admitted and diuresed with IV Lasix, ( I/O negative-2.5L)  with improvement in her symptoms.  She underwent  DCCV 01/22/14 and was started on Betapace to keep her in NSR.    Principal Problem:   Acute on chronic combined systolic and diastolic HF (heart failure) Active Problems:   Atrial fibrillation with RVR   PAF (paroxysmal atrial fibrillation)   HTN (hypertension)   Pacemaker- MDT implanted 12/26/12   Chronic renal insufficiency, stage III (moderate)   LBBB (left bundle Geordie Nooney block)   Hyperlipidemia   PLAN: EKG- A paced, QTc 497   Will review meds with MD- Diovan was not ordered on admission and her SCr is down to 1.2, and her B/P is stable.  Livalo and Elavil also not ordered on admission, these should probably be resumed.   Corine Shelter PA-C [facility] [phone] 01/24/2014, 8:02 AM   Patient seen and discussed with PA Kil[place]. Started on sotalol this admission for afib late Thurs night, she has not quite completed the recommended 3 days (72 hr) monitoring following initiation of this medication, plan to monitor for rest of today. Computer measured QTc of 497, manual measurement of 471, both acceptable range, continue current dosing. She is A-paced V-sensed by most recent EKG. Echo shows normal LVEF 50-55%, grade I diastolic dysfunction. No SOB this AM, appears euvolemic.   Dominga Ferry MD

## 2014-01-25 ENCOUNTER — Encounter (HOSPITAL_COMMUNITY): Payer: Self-pay | Admitting: Internal Medicine

## 2014-01-25 DIAGNOSIS — I495 Sick sinus syndrome: Secondary | ICD-10-CM

## 2014-01-25 MED ORDER — EDOXABAN TOSYLATE 30 MG PO TABS: 30.0000 mg | ORAL_TABLET | Freq: Every day | ORAL | Status: DC

## 2014-01-25 MED ORDER — SOTALOL HCL 80 MG PO TABS
80.0000 mg | ORAL_TABLET | Freq: Every day | ORAL | Status: DC
  Administered 2014-01-25: 80 mg via ORAL
  Filled 2014-01-25: qty 1

## 2014-01-25 MED ORDER — SOTALOL HCL 80 MG PO TABS: 80.0000 mg | ORAL_TABLET | Freq: Every day | ORAL | Status: DC

## 2014-01-25 NOTE — Progress Notes (Signed)
Pt A&O x4; pt discharge education and instructions completed with pt and spouse at bedside. All voices understanding and denies any questions. Pt IV and telemetry removed; pt handed education information on edoxaban and sotalol. pt to pick up her prescription from preferred pharmacy on file locally. Pt discharge home with spouse to transport her home. Pt transported off unit via wheelchair with belongings and spouse to the side. Arabella Merles Promiss Labarbera RN.

## 2014-01-25 NOTE — Progress Notes (Addendum)
UR completed Shemaiah Round K. Telvin Reinders, RN, BSN, MSHL, CCM  01/25/2014 11:15 AM

## 2014-01-25 NOTE — Discharge Summary (Signed)
Discharge Summary   Patient ID: SAHMYA ARAI,  MRN: [id number], DOB/AGE: [birth year] 74 y.o.  Admit date: 01/21/2014 Discharge date: 01/25/2014  Primary Care Provider: Pcp Not In System Primary Cardiologist: M. Croitoru, MD   Discharge Diagnoses Principal Problem:   Atrial fibrillation with RVR  **S/P sotalol loading and successful DCCV this admission.  **Chronic edoxaban therapy (CHA2DS2VASc = 4).  Active Problems:   Acute on chronic combined systolic and diastolic HF (heart failure)  **Discharge weight 157 lbs.   HTN (hypertension)   Chronic renal insufficiency, stage III (moderate)  **Creatinine 1.2 on 01/23/2014.   LBBB (left bundle branch block)   Hyperlipidemia   Tachy-brady syndrome s/p Pacemaker- MDT implanted 12/26/12  Allergies Allergies  Allergen Reactions  . Codeine Nausea And Vomiting   Procedures  Direct Current Cardioversion 1.15.2016  Plan: 1. Successful DCCV to a-paced rhythm. Normal AV conduction.   2. Pacer reprogrammed for atrial overdrive features. _____________  2D Echocardiogram 1.16.2016  Study Conclusions  - Left ventricle: The cavity size was normal. Wall thickness was   normal. Systolic function was normal. The estimated ejection   fraction was in the range of 50% to 55%. Wall motion was normal;   there were no regional wall motion abnormalities. Doppler   parameters are consistent with abnormal left ventricular   relaxation (grade 1 diastolic dysfunction). - Ventricular septum: Septal motion showed abnormal function and   dyssynergy. - Aortic valve: There was mild regurgitation. - Mitral valve: There was mild regurgitation. - Pulmonary arteries: PA peak pressure: 33 mm Hg (S). _____________   History of Present Illness  74 year old female with a prior history of paroxysmal atrial fibrillation and tachycardia-bradycardia syndrome status post permanent pacemaker placement. She had previously been treated with flecainide therapy  however this was discontinued in 2014. over a 2 week period prior to admission, she began to experience aggressive fatigue and dyspnea.  on the night prior to admission, she noted significant dyspnea and orthopnea and presented to the [facility]. There, she was found to be in atrial fibrillation with rapid ventricular response with evidence of volume overload.   She was treated with IV Lasix and also intravenous diltiazem and admitted for further evaluation.  Hospital Course  Following admission, she was placed on sotalol therapy, initially at 80 mg twice a day. Arrangements were made for cardioversion and this was successfully performed on the morning of January 15. Her home dose of Edoxaban was reduced to 30 mg daily in the setting of chronic kidney disease. 2-D echocardiography was performed on January 16 and showed an EF of 50-55% with grade 1 diastolic dysfunction. She was able to maintain sinus rhythm and also diuresed some further for a net negative of 1.6 L for this admission. In this setting, she has had significant clinical improvement. Her QTC has remained stable and was 492 ms this morning in the setting of an underlying left bundle branch block. We have reduced her sotalol dose down to 80 mg daily given GFR between 30 and 50. She'll be discharged home today in good condition.   Discharge Vitals Blood pressure 125/68, pulse 70, temperature 97.8 F (36.6 C), temperature source Oral, resp. rate 18, height  (1.6 m), weight 157 lb 3.7 oz (71.319 kg), SpO2 97 %.  Filed Weights   01/22/14 0543 01/23/14 0523 01/24/14 0518  Weight: 155 lb 13.8 oz (70.7 kg) 158 lb 3.2 oz (71.759 kg) 157 lb 3.7 oz (71.319 kg)   Labs  CBC Lab Results  Component Value Date   WBC 6.1 01/22/2014   HGB 11.6* 01/22/2014   HCT 34.3* 01/22/2014   MCV 91.5 01/22/2014   PLT 273 01/22/2014    Basic Metabolic Panel  Recent Labs  01/23/14 1300  NA 139  K 3.9  CL 107  CO2 26  GLUCOSE 90  BUN 21    CREATININE 1.20*  CALCIUM 9.7   Liver Function Tests Lab Results  Component Value Date   ALT 79* 01/21/2014   AST 70* 01/21/2014   ALKPHOS 164* 01/21/2014   BILITOT 0.6 01/21/2014    Cardiac Enzymes Lab Results  Component Value Date   TROPONINI <0.03 01/22/2014    Fasting Lipid Panel Lab Results  Component Value Date   CHOL 121 01/22/2014   HDL 42 01/22/2014   LDLCALC 60 01/22/2014   TRIG 94 01/22/2014   CHOLHDL 2.9 01/22/2014    Thyroid Function Tests Lab Results  Component Value Date   TSH 2.783 01/21/2014   Disposition  Pt is being discharged home today in good condition.  Follow-up Plans & Appointments  Follow-up Information    Follow up with [facility] R, NP On 02/09/2014.   Specialty:  Cardiology   Why:  9:00 AM - D   Contact information:   [address] [place] [address] [phone]      Discharge Medications    Medication List    STOP taking these medications        nebivolol 10 MG tablet  Commonly known as:  BYSTOLIC      TAKE these medications        acetaminophen 325 MG tablet  Commonly known as:  TYLENOL  Take 1-2 tablets (325-650 mg total) by mouth every 4 (four) hours as needed for mild pain.     amitriptyline 25 MG tablet  Commonly known as:  ELAVIL  TAKE 1 TABLET AT BEDTIME     edoxaban 30 MG Tabs tablet  Commonly known as:  SAVAYSA  Take 1 tablet (30 mg total) by mouth daily.     Pitavastatin Calcium 1 MG Tabs  Commonly known as:  LIVALO  Take 1 tablet (1 mg total) by mouth daily.     sotalol 80 MG tablet  Commonly known as:  BETAPACE  Take 1 tablet (80 mg total) by mouth daily.     valsartan 160 MG tablet  Commonly known as:  DIOVAN  Take 1 tablet (160 mg total) by mouth daily.       Outstanding Labs/Studies  F/U BMET upon return office visit.  Duration of Discharge Encounter   Greater than 30 minutes including physician time.  Signed, Nicolasa Ducking NP 01/25/2014, 10:19 AM    Patient seen and examined.  Plan as discussed in my rounding note for today and outlined above. Fayrene Fearing [facility]  01/25/2014  11:11 AM

## 2014-01-25 NOTE — Progress Notes (Signed)
Patient Name: Monica Harvey Date of Encounter: 01/25/2014   Principal Problem:   Atrial fibrillation with RVR Active Problems:   Acute on chronic combined systolic and diastolic HF (heart failure)   HTN (hypertension)   Chronic renal insufficiency, stage III (moderate)   LBBB (left bundle branch block)   Hyperlipidemia   Pacemaker- MDT implanted 12/26/12   Tachy-brady syndrome   SUBJECTIVE  No chest pain or sob.  Maintaining sinus rhythm.  QTc stable @ 496.  Eager to go home.  CURRENT MEDS . amitriptyline  25 mg Oral QHS  . edoxaban  30 mg Oral Q24H  . sotalol  80 mg Oral Q12H    OBJECTIVE  Filed Vitals:   01/24/14 0518 01/24/14 1014 01/24/14 1356 01/24/14 2213  BP: 121/60 135/92 117/54 118/79  Pulse: 80 66 70 72  Temp: 97.6 F (36.4 C) 97.5 F (36.4 C) 97.5 F (36.4 C) 97.5 F (36.4 C)  TempSrc: Oral Oral Oral Oral  Resp: 18 18 20 18   Height:      Weight: 157 lb 3.7 oz (71.319 kg)     SpO2: 100% 98% 100% 100%    Intake/Output Summary (Last 24 hours) at 01/25/14 0909 Last data filed at 01/25/14 0851  Gross per 24 hour  Intake   1080 ml  Output    400 ml  Net    680 ml   Filed Weights   01/22/14 0543 01/23/14 0523 01/24/14 0518  Weight: 155 lb 13.8 oz (70.7 kg) 158 lb 3.2 oz (71.759 kg) 157 lb 3.7 oz (71.319 kg)    PHYSICAL EXAM  General: Pleasant, NAD. Neuro: Alert and oriented X 3. Moves all extremities spontaneously. Psych: Normal affect. HEENT:  Normal  Neck: Supple without bruits or JVD. Lungs:  Resp regular and unlabored, CTA. Heart: RRR no s3, s4, or murmurs. Abdomen: Soft, non-tender, non-distended, BS + x 4.  Extremities: No clubbing, cyanosis or edema. DP/PT/Radials 2+ and equal bilaterally.  Accessory Clinical Findings  Basic Metabolic Panel  Recent Labs  01/23/14 1300  NA 139  K 3.9  CL 107  CO2 26  GLUCOSE 90  BUN 21  CREATININE 1.20*  CALCIUM 9.7   TELE  A sensed, V paced.  ECG  A paced, V sensed, 71, LBBB, QTc  = 496.  Radiology/Studies  Dg Chest Port 1 View  01/21/2014   CLINICAL DATA:  Acute onset of shortness of breath. Initial encounter.  EXAM: PORTABLE CHEST - 1 VIEW  COMPARISON:  Chest radiograph performed 12/27/2012  FINDINGS: The lungs are well expanded. Vascular congestion is noted, with bibasilar airspace opacities, concerning for pulmonary edema. No definite pleural effusion or pneumothorax is seen.  The cardiomediastinal silhouette is borderline normal in size. A pacemaker is noted overlying the left chest wall, with leads ending overlying the right atrium and right ventricle. No acute osseous abnormalities are seen.  IMPRESSION: Vascular congestion, with bibasilar airspace opacities, concerning for pulmonary edema.   Electronically Signed   By: Roanna Raider M.D.   On: 01/21/2014 06:40   2D Echocardiogram 1.16.2016  Study Conclusions  - Left ventricle: The cavity size was normal. Wall thickness was normal. Systolic function was normal. The estimated ejection fraction was in the range of 50% to 55%. Wall motion was normal; there were no regional wall motion abnormalities. Doppler parameters are consistent with abnormal left ventricular relaxation (grade 1 diastolic dysfunction). - Ventricular septum: Septal motion showed abnormal function and dyssynergy. - Aortic valve: There was mild regurgitation. -  Mitral valve: There was mild regurgitation. - Pulmonary arteries: PA peak pressure: 33 mm Hg (S). _____________   ASSESSMENT AND PLAN  1.  Afib RVR:  S/p DCCV and maintaining sinus rhythm (A paced/V sensed) on tele.  QTc 496.  Currently on sotalol 80 bid.  With GFR range of 30's to low 50's, may want to decrease to 80 daily.  Will d/w Dr. Antoine Poche.  Cont savaysa 30 daily (was previously on 60 mg daily - reduced this admission r/t GFR).  2.  Acute on chronic diastolic CHF: EF 96-04% by echo this admission.  Overall minus 1.6 Liters.  Wt reporting variable (154 155 158 157  lbs).  Euvolemic.  HR/BP stable.    3.  Stage III Kidney Disease:  Creat on 1/16 stable @ 1.20.  4.  HTN:  Was on bystolic and valsartan @ home.  Both held on admission.  BP has been stable off of meds.  Will need close outpt f/u.  5.  HL:  Resume pitavastain @ d/c.  6. Dispo: D/C today.   Signed, Nicolasa Ducking NP  History and all data above reviewed.  Patient examined.  I agree with the findings as above. She feels good   The patient exam reveals COR:RRR  ,  Lungs: Clear  ,  Abd: Positive bowel sounds, no rebound no guarding, Ext No edema  .  All available labs, radiology testing, previous records reviewed. Agree with documented assessment and plan. No pain, no SOB.  Ready to go home. QTC is OK.  Sotalol dose adjusted for creat.     Rollene Rotunda  9:35 AM  01/25/2014

## 2014-01-25 NOTE — Discharge Instructions (Signed)
***  PLEASE REMEMBER TO BRING ALL OF YOUR MEDICATIONS TO EACH OF YOUR FOLLOW-UP OFFICE VISITS.  

## 2014-01-26 ENCOUNTER — Telehealth: Payer: Self-pay | Admitting: Cardiovascular Disease

## 2014-01-26 NOTE — Telephone Encounter (Signed)
Pt called in stating that she was just discharged from the hospital and she was looking at her meds list and did not see her Bystolic. She wanted to know if this is a medication she should continue to take. Please call  thanks

## 2014-01-26 NOTE — Telephone Encounter (Signed)
Pt d/ced from Bystolic and started on sotalol at discharge, by provider. She voiced understanding.

## 2014-02-09 ENCOUNTER — Encounter: Payer: Self-pay | Admitting: Cardiology

## 2014-02-09 ENCOUNTER — Ambulatory Visit (INDEPENDENT_AMBULATORY_CARE_PROVIDER_SITE_OTHER): Payer: Medicare Other | Admitting: *Deleted

## 2014-02-09 ENCOUNTER — Ambulatory Visit (INDEPENDENT_AMBULATORY_CARE_PROVIDER_SITE_OTHER): Payer: Medicare Other | Admitting: Cardiology

## 2014-02-09 VITALS — BP 136/86 | HR 76 | Ht 63.0 in | Wt 156.9 lb

## 2014-02-09 DIAGNOSIS — I4891 Unspecified atrial fibrillation: Secondary | ICD-10-CM | POA: Diagnosis not present

## 2014-02-09 DIAGNOSIS — I440 Atrioventricular block, first degree: Secondary | ICD-10-CM

## 2014-02-09 DIAGNOSIS — Z950 Presence of cardiac pacemaker: Secondary | ICD-10-CM

## 2014-02-09 DIAGNOSIS — N189 Chronic kidney disease, unspecified: Secondary | ICD-10-CM | POA: Diagnosis not present

## 2014-02-09 DIAGNOSIS — I495 Sick sinus syndrome: Secondary | ICD-10-CM

## 2014-02-09 DIAGNOSIS — R001 Bradycardia, unspecified: Secondary | ICD-10-CM

## 2014-02-09 DIAGNOSIS — Z7901 Long term (current) use of anticoagulants: Secondary | ICD-10-CM | POA: Insufficient documentation

## 2014-02-09 DIAGNOSIS — E785 Hyperlipidemia, unspecified: Secondary | ICD-10-CM

## 2014-02-09 DIAGNOSIS — N183 Chronic kidney disease, stage 3 unspecified: Secondary | ICD-10-CM

## 2014-02-09 DIAGNOSIS — I10 Essential (primary) hypertension: Secondary | ICD-10-CM

## 2014-02-09 DIAGNOSIS — I480 Paroxysmal atrial fibrillation: Secondary | ICD-10-CM

## 2014-02-09 DIAGNOSIS — I5042 Chronic combined systolic (congestive) and diastolic (congestive) heart failure: Secondary | ICD-10-CM | POA: Insufficient documentation

## 2014-02-09 DIAGNOSIS — I447 Left bundle-branch block, unspecified: Secondary | ICD-10-CM

## 2014-02-09 HISTORY — DX: Long term (current) use of anticoagulants: Z79.01

## 2014-02-09 LAB — MDC_IDC_ENUM_SESS_TYPE_INCLINIC
Battery Voltage: 3.03 V
Brady Statistic AP VP Percent: 0.1 % — CL
Brady Statistic AP VS Percent: 97.7 %
Lead Channel Impedance Value: 475 Ohm
Lead Channel Pacing Threshold Amplitude: 0.75 V
Lead Channel Pacing Threshold Amplitude: 0.75 V
Lead Channel Pacing Threshold Pulse Width: 0.4 ms
Lead Channel Pacing Threshold Pulse Width: 0.4 ms
Lead Channel Sensing Intrinsic Amplitude: 9 mV
Lead Channel Setting Pacing Amplitude: 2 V
Lead Channel Setting Pacing Amplitude: 2.5 V
Lead Channel Setting Pacing Pulse Width: 0.4 ms
MDC IDC MSMT LEADCHNL RA IMPEDANCE VALUE: 399 Ohm
MDC IDC MSMT LEADCHNL RA SENSING INTR AMPL: 3 mV
MDC IDC SET LEADCHNL RV SENSING SENSITIVITY: 0.9 mV
MDC IDC STAT BRADY AS VP PERCENT: 2.3 %
MDC IDC STAT BRADY AS VS PERCENT: 0.1 % — AB

## 2014-02-09 MED ORDER — EDOXABAN TOSYLATE 30 MG PO TABS: 30.0000 mg | ORAL_TABLET | Freq: Every day | ORAL | Status: DC

## 2014-02-09 NOTE — Assessment & Plan Note (Signed)
stable °

## 2014-02-09 NOTE — Patient Instructions (Signed)
Your physician recommends that you schedule a follow-up appointment in: 6 weeks with Dr. Royann Shivers. No changes were made today in your treatment.

## 2014-02-09 NOTE — Assessment & Plan Note (Signed)
Recent admission for atrial fib with RVR, was already anticoagulated, cardioverted to sinus rhythm and placed on sotalol. Days reduced to 80 mg once daily secondary to renal insufficiency.  Now with episodes of fluttering at night that wake her from sleep.  Pacemaker eval:

## 2014-02-09 NOTE — Assessment & Plan Note (Signed)
Mostly a pacing on interrogation.  No a fib noted. To follow with Dr. Royann Shivers in 4-6 weeks.

## 2014-02-09 NOTE — Assessment & Plan Note (Signed)
Recent acute episode but also with rapid a fib.  Now euvolemic.  Will continue to monitor.

## 2014-02-09 NOTE — Assessment & Plan Note (Signed)
Stable and no bleeding

## 2014-02-09 NOTE — Assessment & Plan Note (Signed)
Lipid Panel     Component Value Date/Time   CHOL 121 01/22/2014 0426   TRIG 94 01/22/2014 0426   HDL 42 01/22/2014 0426   CHOLHDL 2.9 01/22/2014 0426   VLDL 19 01/22/2014 0426   LDLCALC 60 01/22/2014 0426   Well controlled

## 2014-02-09 NOTE — Progress Notes (Signed)
02/09/2014   PCP: Pcp Not In System   Chief Complaint  Patient presents with  . post hospital    post CHF and Afib RVR, with DCCV patient reports no problems since having cardioversion    Primary Cardiologist:Dr. Ruby Cola  HPI:  74 year old female with a prior history of paroxysmal atrial fibrillation and tachycardia-bradycardia syndrome status post permanent pacemaker placement. She had previously been treated with flecainide therapy however this was discontinued in 2014. over a 2 week period prior to admission, she began to experience aggressive fatigue and dyspnea. on the night prior to admission, she noted significant dyspnea and orthopnea and presented to the [facility]. There, she was found to be in atrial fibrillation with rapid ventricular response with evidence of volume overload. She was treated with IV Lasix and also intravenous diltiazem and admitted for further evaluation.   She was placed on sotalol therapy, initially at 80 mg twice a day. Arrangements were made for cardioversion and this was successfully performed on the morning of January 15. Her home dose of Edoxaban was reduced to 30 mg daily in the setting of chronic kidney disease. 2-D echocardiography was performed on January 16 and showed an EF of 50-55% with grade 1 diastolic dysfunction. She was able to maintain sinus rhythm and also diuresed some further for a net negative of 1.6 L for this admission. In this setting, she has had significant clinical improvement. Her QTC has remained stable and was 492 ms at discharge in the setting of an underlying left bundle branch block. We have reduced her sotalol dose down to 80 mg daily given GFR between 30 and 50.  CHADSVasc score is 4- on savaysa.  She is back for follow up post hospital today with episodes of fluttering at night that wakes her from sleep.  No chest pain no SOB.     Allergies  Allergen Reactions  . Codeine Nausea And Vomiting     Current Outpatient Prescriptions  Medication Sig Dispense Refill  . acetaminophen (TYLENOL) 325 MG tablet Take 1-2 tablets (325-650 mg total) by mouth every 4 (four) hours as needed for mild pain.    Marland Kitchen amitriptyline (ELAVIL) 25 MG tablet TAKE 1 TABLET AT BEDTIME 90 tablet 1  . edoxaban (SAVAYSA) 30 MG TABS tablet Take 1 tablet (30 mg total) by mouth daily. 90 tablet 3  . Pitavastatin Calcium (LIVALO) 1 MG TABS Take 1 tablet (1 mg total) by mouth daily. 90 tablet 1  . sotalol (BETAPACE) 80 MG tablet Take 1 tablet (80 mg total) by mouth daily. 30 tablet 6  . valsartan (DIOVAN) 160 MG tablet Take 1 tablet (160 mg total) by mouth daily. 90 tablet 1   No current facility-administered medications for this visit.    Past Medical History  Diagnosis Date  . SSS (sick sinus syndrome)     a. s/p PPM-Medtronic placed 12/26/12   . PAF (paroxysmal atrial fibrillation)     a. Previously on flecainide;  b. 01/2014 sotalol loaded and dccv performed;  c. Chronic Savaysa Rx.  Marland Kitchen LBBB (left bundle branch block)   . Hyperlipidemia   . Symptomatic sinus bradycardia   . First degree atrioventricular block   . HTN (hypertension)   . Presence of permanent cardiac pacemaker     a. 12/26/12 s/p MDT DC PPM.  . Heart murmur   . Anginal pain   . GERD (gastroesophageal reflux disease)   . Chronic diastolic CHF (  congestive heart failure)     a. 01/2014 Echo: EF 50-55%, Gr1 DD, mild AI/MR.  Marland Kitchen Chronic anticoagulation, CHAD2Vasc score 4 02/09/2014    Past Surgical History  Procedure Laterality Date  . Nm myocar perf wall motion  08/05/2009    small to mod. reversible anteroseptal,septal,& apical perfusion defect. EF 59%.  . Permanent pacemaker insertion N/A 12/26/2012    Procedure: PERMANENT PACEMAKER INSERTION;  Surgeon: Thurmon Fair, MD;  Location: [facility] CATH LAB;  Service: Cardiovascular;  Laterality: N/A;  . Tonsillectomy and adenoidectomy  1960's  . Insert / replace / remove pacemaker  12/26/2012     Medtronic, Dr. Royann Shivers  . Tubal ligation  1971  . Appendectomy  1971  . Breast lumpectomy Right 1980's?    "benign"  . Vaginal hysterectomy  1980's  . Cardioversion N/A 01/22/2014    Procedure: CARDIOVERSION;  Surgeon: Chrystie Nose, MD;  Location: [facility] ENDOSCOPY;  Service: Cardiovascular;  Laterality: N/A;    LXB:WIOMBTD:HR colds or fevers, continued weight loss Skin:no rashes or ulcers HEENT:no blurred vision, no congestion CV:see HPI PUL:see HPI GI:no diarrhea constipation or melena, no indigestion GU:no hematuria, no dysuria MS:no joint pain, no claudication Neuro:no syncope, no lightheadedness Endo:no diabetes, no thyroid disease  Wt Readings from Last 3 Encounters:  02/09/14 156 lb 14.4 oz (71.169 kg)  01/24/14 157 lb 3.7 oz (71.319 kg)  08/18/13 161 lb 1.6 oz (73.074 kg)    PHYSICAL EXAM BP 136/86 mmHg  Pulse 76  Ht 5\' 3"  (1.6 m)  Wt 156 lb 14.4 oz (71.169 kg)  BMI 27.80 kg/m2 General:Pleasant affect, NAD Skin:Warm and dry, brisk capillary refill HEENT:normocephalic, sclera clear, mucus membranes moist Neck:supple, no JVD, no bruits  Heart:S1S2 RRR with soft systolic murmur, no gallup, rub or click Lungs:clear without rales, rhonchi, or wheezes CBU:LAGT, non tender, + BS, do not palpate liver spleen or masses Ext:no lower ext edema, 2+ pedal pulses, 2+ radial pulses Neuro:alert and oriented X 3, MAE, follows commands, + facial symmetry EKG:SR QTC less than at discharge.  LBBB no acute changes.  Pacer interrogated and no PAF noted.    ASSESSMENT AND PLAN PAF (paroxysmal atrial fibrillation) Recent admission for atrial fib with RVR, was already anticoagulated, cardioverted to sinus rhythm and placed on sotalol. Days reduced to 80 mg once daily secondary to renal insufficiency.  Now with episodes of fluttering at night that wake her from sleep.  Pacemaker eval:   Chronic anticoagulation, CHAD2Vasc score 4 Stable and no bleeding   Chronic renal  insufficiency, stage III (moderate) BMET    Component Value Date/Time   NA 139 01/23/2014 1300   K 3.9 01/23/2014 1300   CL 107 01/23/2014 1300   CO2 26 01/23/2014 1300   GLUCOSE 90 01/23/2014 1300   BUN 21 01/23/2014 1300   CREATININE 1.20* 01/23/2014 1300   CREATININE 1.25* 08/18/2013 1017   CALCIUM 9.7 01/23/2014 1300   GFRNONAA 44* 01/23/2014 1300   GFRAA 51* 01/23/2014 1300       Chronic combined systolic and diastolic CHF, NYHA class 1 Recent acute episode but also with rapid a fib.  Now euvolemic.  Will continue to monitor.   HTN (hypertension) stable   Hyperlipidemia Lipid Panel     Component Value Date/Time   CHOL 121 01/22/2014 0426   TRIG 94 01/22/2014 0426   HDL 42 01/22/2014 0426   CHOLHDL 2.9 01/22/2014 0426   VLDL 19 01/22/2014 0426   LDLCALC 60 01/22/2014 0426   Well controlled  Pacemaker- MDT implanted 12/26/12 Mostly a pacing on interrogation.  No a fib noted. To follow with Dr. Royann Shivers in 4-6 weeks.

## 2014-02-09 NOTE — Assessment & Plan Note (Signed)
BMET    Component Value Date/Time   NA 139 01/23/2014 1300   K 3.9 01/23/2014 1300   CL 107 01/23/2014 1300   CO2 26 01/23/2014 1300   GLUCOSE 90 01/23/2014 1300   BUN 21 01/23/2014 1300   CREATININE 1.20* 01/23/2014 1300   CREATININE 1.25* 08/18/2013 1017   CALCIUM 9.7 01/23/2014 1300   GFRNONAA 44* 01/23/2014 1300   GFRAA 51* 01/23/2014 1300

## 2014-02-10 NOTE — Progress Notes (Signed)
Pacemaker check in clinic w/Laura. Normal device function. Thresholds, sensing, impedances consistent with previous measurements. Device programmed to maximize longevity. 1 AT/AF episode x >99hrs @ 600/214 + Savaysa/Sotalol---no AF since DCCV on 1/15. No high ventricular rates noted. Device programmed at appropriate safety margins. Histogram distribution appropriate for patient activity level. Device programmed to optimize intrinsic conduction. Estimated longevity 9 years. Patient will follow up with [facility] on 3/14 @ 9:45am.

## 2014-02-22 ENCOUNTER — Other Ambulatory Visit: Payer: Self-pay | Admitting: Nurse Practitioner

## 2014-02-22 MED ORDER — SOTALOL HCL 80 MG PO TABS: 80.0000 mg | ORAL_TABLET | Freq: Every day | ORAL | Status: DC

## 2014-02-26 ENCOUNTER — Encounter: Payer: Self-pay | Admitting: Cardiovascular Disease

## 2014-03-01 ENCOUNTER — Telehealth: Payer: Self-pay | Admitting: Cardiovascular Disease

## 2014-03-01 NOTE — Telephone Encounter (Signed)
Monica Harvey is calling about her Savaysa  She was on 60mg  and the one she just receive thru the mail is 30mg  and wants to know has it been changed . Please call .  Thanks

## 2014-03-01 NOTE — Telephone Encounter (Signed)
Per note from L. Annie Paras, NP "home dose of Edoxaban was reduced to 30 mg daily in the setting of chronic kidney disease"

## 2014-03-01 NOTE — Telephone Encounter (Signed)
LMTCB

## 2014-03-01 NOTE — Telephone Encounter (Signed)
Patient returned call. Communicated information denoted below. Patient voiced understanding

## 2014-03-15 ENCOUNTER — Encounter: Payer: Self-pay | Admitting: Cardiovascular Disease

## 2014-03-15 ENCOUNTER — Telehealth: Payer: Self-pay | Admitting: Cardiovascular Disease

## 2014-03-18 NOTE — Telephone Encounter (Signed)
Closed encounter °

## 2014-03-22 ENCOUNTER — Encounter: Payer: Medicare Other | Admitting: Cardiovascular Disease

## 2014-04-13 ENCOUNTER — Other Ambulatory Visit: Payer: Self-pay | Admitting: Cardiovascular Disease

## 2014-04-14 NOTE — Telephone Encounter (Signed)
Rx(s) sent to pharmacy electronically.  

## 2014-06-08 ENCOUNTER — Ambulatory Visit (INDEPENDENT_AMBULATORY_CARE_PROVIDER_SITE_OTHER): Payer: Medicare Other | Admitting: Cardiovascular Disease

## 2014-06-08 ENCOUNTER — Encounter: Payer: Self-pay | Admitting: Cardiovascular Disease

## 2014-06-08 VITALS — BP 137/85 | HR 86 | Ht 63.0 in | Wt 157.9 lb

## 2014-06-08 DIAGNOSIS — Z79899 Other long term (current) drug therapy: Secondary | ICD-10-CM | POA: Diagnosis not present

## 2014-06-08 DIAGNOSIS — I495 Sick sinus syndrome: Secondary | ICD-10-CM | POA: Diagnosis not present

## 2014-06-08 DIAGNOSIS — E785 Hyperlipidemia, unspecified: Secondary | ICD-10-CM

## 2014-06-08 DIAGNOSIS — R001 Bradycardia, unspecified: Secondary | ICD-10-CM | POA: Diagnosis not present

## 2014-06-08 DIAGNOSIS — I4891 Unspecified atrial fibrillation: Secondary | ICD-10-CM | POA: Diagnosis not present

## 2014-06-08 DIAGNOSIS — I10 Essential (primary) hypertension: Secondary | ICD-10-CM

## 2014-06-08 DIAGNOSIS — I480 Paroxysmal atrial fibrillation: Secondary | ICD-10-CM

## 2014-06-08 DIAGNOSIS — I447 Left bundle-branch block, unspecified: Secondary | ICD-10-CM

## 2014-06-08 DIAGNOSIS — I440 Atrioventricular block, first degree: Secondary | ICD-10-CM

## 2014-06-08 DIAGNOSIS — Z7901 Long term (current) use of anticoagulants: Secondary | ICD-10-CM

## 2014-06-08 DIAGNOSIS — Z950 Presence of cardiac pacemaker: Secondary | ICD-10-CM

## 2014-06-08 LAB — BASIC METABOLIC PANEL
BUN: 15 mg/dL (ref 6–23)
CHLORIDE: 105 meq/L (ref 96–112)
CO2: 29 mEq/L (ref 19–32)
Calcium: 9.9 mg/dL (ref 8.4–10.5)
Creat: 0.97 mg/dL (ref 0.50–1.10)
GLUCOSE: 84 mg/dL (ref 70–99)
POTASSIUM: 4.9 meq/L (ref 3.5–5.3)
Sodium: 140 mEq/L (ref 135–145)

## 2014-06-08 LAB — CUP PACEART INCLINIC DEVICE CHECK
Brady Statistic AP VP Percent: 0.1 % — CL
Brady Statistic AP VS Percent: 97.6 %
Brady Statistic AS VP Percent: 0.1 % — CL
Brady Statistic AS VS Percent: 2.4 %
Lead Channel Impedance Value: 475 Ohm
Lead Channel Pacing Threshold Amplitude: 0.75 V
Lead Channel Pacing Threshold Amplitude: 0.875 V
Lead Channel Pacing Threshold Pulse Width: 0.4 ms
Lead Channel Pacing Threshold Pulse Width: 0.4 ms
Lead Channel Sensing Intrinsic Amplitude: 3.5 mV
Lead Channel Setting Pacing Amplitude: 2 V
Lead Channel Setting Sensing Sensitivity: 0.9 mV
MDC IDC MSMT BATTERY VOLTAGE: 3.02 V
MDC IDC MSMT LEADCHNL RA IMPEDANCE VALUE: 399 Ohm
MDC IDC MSMT LEADCHNL RV SENSING INTR AMPL: 10.4 mV
MDC IDC SESS DTM: 20160531102154
MDC IDC SET LEADCHNL RV PACING AMPLITUDE: 2.5 V
MDC IDC SET LEADCHNL RV PACING PULSEWIDTH: 0.4 ms

## 2014-06-08 MED ORDER — SOTALOL HCL 80 MG PO TABS: 80.0000 mg | ORAL_TABLET | Freq: Every day | ORAL | Status: DC

## 2014-06-08 MED ORDER — VALSARTAN 160 MG PO TABS: 160.0000 mg | ORAL_TABLET | Freq: Every day | ORAL | Status: DC

## 2014-06-08 MED ORDER — PITAVASTATIN CALCIUM 1 MG PO TABS: 1.0000 | ORAL_TABLET | Freq: Every day | ORAL | Status: DC

## 2014-06-08 MED ORDER — EDOXABAN TOSYLATE 30 MG PO TABS: 30.0000 mg | ORAL_TABLET | Freq: Every day | ORAL | Status: DC

## 2014-06-08 NOTE — Patient Instructions (Signed)
Dr Royann Shivers recommends that you return for lab work at [facility].  Remote monitoring is used to monitor your Pacemaker of ICD from home. This monitoring reduces the number of office visits required to check your device to one time per year. It allows [place] to keep an eye on the functioning of your device to ensure it is working properly. You are scheduled for a device check from home on September 07, 2014. You may send your transmission at any time that day. If you have a wireless device, the transmission will be sent automatically. After your physician reviews your transmission, you will receive a postcard with your next transmission date.  Dr Royann Shivers recommends that you schedule a follow-up appointment in 12 months with pacer check. You will receive a reminder letter in the mail two months in advance. If you don't receive a letter, please call our office to schedule the follow-up appointment.

## 2014-06-08 NOTE — Progress Notes (Signed)
Patient ID: Monica Harvey, female   DOB: 1940-07-05, 74 y.o.   MRN: [id number]     Cardiology Office Note   Date:  06/08/2014   ID:  Monica Harvey, DOB 06-06-1940, MRN [id number]  PCP:  Pcp Not In System  Cardiologist:   Thurmon Fair, MD   Chief Complaint  Patient presents with  . Follow-up    some dizziness,no bilateral swelling      History of Present Illness: Monica Harvey is a 74 y.o. female who presents for pacemaker follow up and paroxysmal atrial fibrillation. She required cardioversion for a lengthy (3-4 weeks) episode of atrial fibrillation in January 2016. Pacemaker check shows no recurrence of arrhythmia since then, on sotalol and Savaysa. She has >97% atrial pacing, no ventricular pacing. The device was implanted in 2014 and has 8.5 years of expected longevity (Medtronic Advisa).  She has treated hypertension and hyperlipidemia, mildly depressed left ventricular ejection fraction (EF 50% by echo) without clinical heart failure, chronic left bundle branch block and a mildly abnormal myocardial perfusion study (small to moderate reversible anteroseptal septal and apical perfusion defect felt to be consistent with LAD ischemia superimposed on underlying LBBB artifact).  CHADSVasc score is 4. No history of stroke/TIA or bleeding problems.  Past Medical History  Diagnosis Date  . SSS (sick sinus syndrome)     a. s/p PPM-Medtronic placed 12/26/12   . PAF (paroxysmal atrial fibrillation)     a. Previously on flecainide;  b. 01/2014 sotalol loaded and dccv performed;  c. Chronic Savaysa Rx.  Marland Kitchen LBBB (left bundle branch block)   . Hyperlipidemia   . Symptomatic sinus bradycardia   . First degree atrioventricular block   . HTN (hypertension)   . Presence of permanent cardiac pacemaker     a. 12/26/12 s/p MDT DC PPM.  . Heart murmur   . Anginal pain   . GERD (gastroesophageal reflux disease)   . Chronic diastolic CHF (congestive heart failure)     a. 01/2014 Echo: EF  50-55%, Gr1 DD, mild AI/MR.  Marland Kitchen Chronic anticoagulation, CHAD2Vasc score 4 02/09/2014    Past Surgical History  Procedure Laterality Date  . Nm myocar perf wall motion  08/05/2009    small to mod. reversible anteroseptal,septal,& apical perfusion defect. EF 59%.  . Permanent pacemaker insertion N/A 12/26/2012    Procedure: PERMANENT PACEMAKER INSERTION;  Surgeon: Thurmon Fair, MD;  Location: [facility] CATH LAB;  Service: Cardiovascular;  Laterality: N/A;  . Tonsillectomy and adenoidectomy  1960's  . Insert / replace / remove pacemaker  12/26/2012    Medtronic, Dr. Royann Shivers  . Tubal ligation  1971  . Appendectomy  1971  . Breast lumpectomy Right 1980's?    "benign"  . Vaginal hysterectomy  1980's  . Cardioversion N/A 01/22/2014    Procedure: CARDIOVERSION;  Surgeon: Chrystie Nose, MD;  Location: [facility] ENDOSCOPY;  Service: Cardiovascular;  Laterality: N/A;     Current Outpatient Prescriptions  Medication Sig Dispense Refill  . acetaminophen (TYLENOL) 325 MG tablet Take 1-2 tablets (325-650 mg total) by mouth every 4 (four) hours as needed for mild pain.    Marland Kitchen amitriptyline (ELAVIL) 25 MG tablet TAKE 1 TABLET AT BEDTIME 90 tablet 1  . edoxaban (SAVAYSA) 30 MG TABS tablet Take 1 tablet (30 mg total) by mouth daily. 90 tablet 3  . Pitavastatin Calcium (LIVALO) 1 MG TABS Take 1 tablet (1 mg total) by mouth daily. 90 tablet 3  . sotalol (BETAPACE) 80 MG tablet Take 1 tablet (  80 mg total) by mouth daily. 90 tablet 3  . valsartan (DIOVAN) 160 MG tablet Take 1 tablet (160 mg total) by mouth daily. 90 tablet 3   No current facility-administered medications for this visit.    Allergies:   Codeine    Social History:  The patient  reports that she has never smoked. She has never used smokeless tobacco. She reports that she does not drink alcohol or use illicit drugs.   Family History:  The patient's family history includes Heart attack in her mother; Stroke in her sister.    ROS:  Please see the  history of present illness.    Otherwise, review of systems positive for none.   All other systems are reviewed and negative.    PHYSICAL EXAM: VS:  BP 137/85 mmHg  Pulse 86  Ht  (1.6 m)  Wt 71.623 kg (157 lb 14.4 oz)  BMI 27.98 kg/m2 , BMI Body mass index is 27.98 kg/(m^2). General: Alert, oriented x3, no distress Head: no evidence of trauma, PERRL, EOMI, no exophtalmos or lid lag, no myxedema, no xanthelasma; normal ears, nose and oropharynx Neck: normal jugular venous pulsations and no hepatojugular reflux; brisk carotid pulses without delay and no carotid bruits Chest: clear to auscultation, no signs of consolidation by percussion or palpation, normal fremitus, symmetrical and full respiratory excursions Cardiovascular: normal position and quality of the apical impulse, regular rhythm, normal first and paradoxically split second heart sounds, no murmurs, rubs or gallops Abdomen: no tenderness or distention, no masses by palpation, no abnormal pulsatility or arterial bruits, normal bowel sounds, no hepatosplenomegaly Extremities: no clubbing, cyanosis or edema; 2+ radial, ulnar and brachial pulses bilaterally; 2+ right femoral, posterior tibial and dorsalis pedis pulses; 2+ left femoral, posterior tibial and dorsalis pedis pulses; no subclavian or femoral bruits Neurological: grossly nonfocal Psych: euthymic mood, full affect   EKG:  EKG is ordered today. The ekg ordered today demonstrates A paced, LBBB, QTc 471 ms   Recent Labs: 01/21/2014: ALT 79*; B Natriuretic Peptide 315.1*; Magnesium 2.1; TSH 2.783 01/22/2014: Hemoglobin 11.6*; Platelets 273 06/08/2014: BUN 15; Creatinine 0.97; Potassium 4.9; Sodium 140    Lipid Panel    Component Value Date/Time   CHOL 121 01/22/2014 0426   TRIG 94 01/22/2014 0426   HDL 42 01/22/2014 0426   CHOLHDL 2.9 01/22/2014 0426   VLDL 19 01/22/2014 0426   LDLCALC 60 01/22/2014 0426      Wt Readings from Last 3 Encounters:  06/08/14  71.623 kg (157 lb 14.4 oz)  02/09/14 71.169 kg (156 lb 14.4 oz)  01/24/14 71.319 kg (157 lb 3.7 oz)      ASSESSMENT AND PLAN:  1. Paroxysmal atrial fibrillation - no recurrence on sotalol since cardioversion in January. No embolic or bleeding complications on Savaysa. Check K and BUN/creatinine.  2. Normal dual chamber pacemaker function  3. Dyslipidemia - lipid profile in January OK.  4. Mild cardiomyopathy/LBBB - on ARB/beta blocker equivalent, NYHA class I, without diuretics.   Current medicines are reviewed at length with the patient today.  The patient does not have concerns regarding medicines.  The following changes have been made:  no change  Labs/ tests ordered today include:  Orders Placed This Encounter  Procedures  . Basic metabolic panel  . Implantable device check  . EKG 12-Lead   Patient Instructions  Dr Royann Shivers recommends that you return for lab work at [facility].  Remote monitoring is used to monitor your Pacemaker of ICD from  home. This monitoring reduces the number of office visits required to check your device to one time per year. It allows [place] to keep an eye on the functioning of your device to ensure it is working properly. You are scheduled for a device check from home on September 07, 2014. You may send your transmission at any time that day. If you have a wireless device, the transmission will be sent automatically. After your physician reviews your transmission, you will receive a postcard with your next transmission date.  Dr Royann Shivers recommends that you schedule a follow-up appointment in 12 months with pacer check. You will receive a reminder letter in the mail two months in advance. If you don't receive a letter, please call our office to schedule the follow-up appointment.    Joie Bimler, MD  06/08/2014 8:59 PM    Thurmon Fair, MD, [facility] [phone] office [phone] pager

## 2014-06-22 ENCOUNTER — Encounter: Payer: Self-pay | Admitting: Cardiovascular Disease

## 2014-07-09 ENCOUNTER — Other Ambulatory Visit: Payer: Self-pay | Admitting: Cardiovascular Disease

## 2014-09-01 ENCOUNTER — Other Ambulatory Visit: Payer: Self-pay

## 2014-09-01 DIAGNOSIS — Z1231 Encounter for screening mammogram for malignant neoplasm of breast: Secondary | ICD-10-CM

## 2014-09-07 ENCOUNTER — Encounter: Payer: Medicare Other | Admitting: *Deleted

## 2014-09-07 ENCOUNTER — Telehealth: Payer: Self-pay | Admitting: Cardiology

## 2014-09-07 NOTE — Telephone Encounter (Signed)
Spoke with pt and reminded pt of remote transmission that is due today. Pt verbalized understanding.   

## 2014-09-08 ENCOUNTER — Encounter: Payer: Self-pay | Admitting: Cardiology

## 2014-09-08 ENCOUNTER — Ambulatory Visit
Admission: RE | Admit: 2014-09-08 | Discharge: 2014-09-08 | Disposition: A | Discharge status: Home / Self Care | Payer: Medicare Other | Source: Ambulatory Visit

## 2014-09-08 DIAGNOSIS — Z1231 Encounter for screening mammogram for malignant neoplasm of breast: Secondary | ICD-10-CM

## 2014-11-17 ENCOUNTER — Encounter: Payer: Self-pay | Admitting: *Deleted

## 2015-01-21 ENCOUNTER — Other Ambulatory Visit: Payer: Self-pay | Admitting: Nurse Practitioner

## 2015-06-29 ENCOUNTER — Other Ambulatory Visit: Payer: Self-pay | Admitting: Cardiovascular Disease

## 2015-06-29 NOTE — Telephone Encounter (Signed)
Rx(s) sent to pharmacy electronically.  

## 2015-07-04 ENCOUNTER — Telehealth: Payer: Self-pay | Admitting: Cardiovascular Disease

## 2015-07-04 ENCOUNTER — Other Ambulatory Visit: Payer: Self-pay | Admitting: *Deleted

## 2015-07-04 MED ORDER — VALSARTAN 160 MG PO TABS: 160.0000 mg | ORAL_TABLET | Freq: Every day | ORAL | Status: DC

## 2015-07-04 NOTE — Telephone Encounter (Signed)
Clarification for valsartan 160 mg Okay to fill for 90 day  No refill.    Notified patient,need appointment.  an schedule appointment =-aug 24,2017 at 9 am Mailed appointment card

## 2015-07-04 NOTE — Telephone Encounter (Signed)
New MEssage  Rept from [facility] following up on refill request sent in for Diovan. Please call back and discuss.

## 2015-09-01 ENCOUNTER — Other Ambulatory Visit: Payer: Self-pay

## 2015-09-01 ENCOUNTER — Encounter (INDEPENDENT_AMBULATORY_CARE_PROVIDER_SITE_OTHER): Payer: Self-pay

## 2015-09-01 ENCOUNTER — Ambulatory Visit (INDEPENDENT_AMBULATORY_CARE_PROVIDER_SITE_OTHER): Payer: Medicare Other | Admitting: Cardiovascular Disease

## 2015-09-01 VITALS — BP 136/78 | HR 71 | Ht 63.0 in | Wt 141.0 lb

## 2015-09-01 DIAGNOSIS — I5032 Chronic diastolic (congestive) heart failure: Secondary | ICD-10-CM

## 2015-09-01 DIAGNOSIS — E785 Hyperlipidemia, unspecified: Secondary | ICD-10-CM

## 2015-09-01 DIAGNOSIS — Z79899 Other long term (current) drug therapy: Secondary | ICD-10-CM

## 2015-09-01 DIAGNOSIS — Z950 Presence of cardiac pacemaker: Secondary | ICD-10-CM | POA: Diagnosis not present

## 2015-09-01 DIAGNOSIS — I4891 Unspecified atrial fibrillation: Secondary | ICD-10-CM | POA: Diagnosis not present

## 2015-09-01 DIAGNOSIS — Z5181 Encounter for therapeutic drug level monitoring: Secondary | ICD-10-CM

## 2015-09-01 DIAGNOSIS — Z7901 Long term (current) use of anticoagulants: Secondary | ICD-10-CM | POA: Diagnosis not present

## 2015-09-01 DIAGNOSIS — I5043 Acute on chronic combined systolic (congestive) and diastolic (congestive) heart failure: Secondary | ICD-10-CM

## 2015-09-01 DIAGNOSIS — I495 Sick sinus syndrome: Secondary | ICD-10-CM

## 2015-09-01 LAB — COMPREHENSIVE METABOLIC PANEL
ALK PHOS: 113 U/L (ref 33–130)
ALT: 21 U/L (ref 6–29)
AST: 24 U/L (ref 10–35)
Albumin: 4.3 g/dL (ref 3.6–5.1)
BILIRUBIN TOTAL: 0.6 mg/dL (ref 0.2–1.2)
BUN: 14 mg/dL (ref 7–25)
CO2: 23 mmol/L (ref 20–31)
Calcium: 10.1 mg/dL (ref 8.6–10.4)
Chloride: 107 mmol/L (ref 98–110)
Creat: 1.11 mg/dL — ABNORMAL HIGH (ref 0.60–0.93)
GLUCOSE: 94 mg/dL (ref 65–99)
Potassium: 4.8 mmol/L (ref 3.5–5.3)
SODIUM: 141 mmol/L (ref 135–146)
Total Protein: 6.7 g/dL (ref 6.1–8.1)

## 2015-09-01 LAB — CBC
HCT: 43 % (ref 35.0–45.0)
HEMOGLOBIN: 14.2 g/dL (ref 11.7–15.5)
MCH: 30.7 pg (ref 27.0–33.0)
MCHC: 33 g/dL (ref 32.0–36.0)
MCV: 92.9 fL (ref 80.0–100.0)
MPV: 10.5 fL (ref 7.5–12.5)
Platelets: 275 10*3/uL (ref 140–400)
RBC: 4.63 MIL/uL (ref 3.80–5.10)
RDW: 13.9 % (ref 11.0–15.0)
WBC: 4.7 10*3/uL (ref 3.8–10.8)

## 2015-09-01 LAB — LIPID PANEL
Cholesterol: 168 mg/dL (ref 125–200)
HDL: 66 mg/dL (ref >=46)
LDL CALC: 77 mg/dL (ref <130)
Total CHOL/HDL Ratio: 2.5 Ratio (ref <=5.0)
Triglycerides: 124 mg/dL (ref <150)
VLDL: 25 mg/dL (ref <30)

## 2015-09-01 MED ORDER — PITAVASTATIN CALCIUM 1 MG PO TABS: 1.0000 | ORAL_TABLET | Freq: Every day | ORAL | 3 refills | Status: DC

## 2015-09-01 MED ORDER — EDOXABAN TOSYLATE 30 MG PO TABS: 30.0000 mg | ORAL_TABLET | Freq: Every day | ORAL | 3 refills | Status: DC

## 2015-09-01 MED ORDER — VALSARTAN 160 MG PO TABS: 160.0000 mg | ORAL_TABLET | Freq: Every day | ORAL | 3 refills | Status: DC

## 2015-09-01 MED ORDER — SOTALOL HCL 80 MG PO TABS: 80.0000 mg | ORAL_TABLET | Freq: Every day | ORAL | 3 refills | Status: DC

## 2015-09-01 NOTE — Progress Notes (Signed)
Cardiology Office Note    Date:  09/03/2015   ID:  Monica ChingBrenda S Lyne, DOB Aug 17, 1940, MRN [id number]  PCP:  Pcp Not In System  Cardiologist:   Thurmon FairMihai Syriah Delisi, MD   Chief Complaint  Patient presents with  . Follow-up    pt c/o chest pain/pressure  and arm pain     History of Present Illness:  Monica Harvey is a [age] y.o. female with tachycardia/bradycardia syndrome who presents for pacemaker follow up and paroxysmal atrial fibrillation. She required cardioversion for a lengthy (3-4 weeks) episode of atrial fibrillation in January 2016.  He has not had any clinically evident arrhythmia since her last appointment. She is on chronic treatment with sotalol and takes a direct oral anticoagulant, without bleeding or embolic events.  Pacemaker check shows normal device function. She has >98% atrial pacing, no ventricular pacing. The device was implanted in 2014 and has 7 years of expected longevity (Medtronic Advisa). Her device shows activity greater than 5 hours a day. No high ventricular rates have been recorded. A few episodes of atrial mode switch are seen, most likely atrial flutter with variable AV block. The longest episode was only 1 minute and 36 seconds in duration and was recorded on 01/18/2014, well over a year and half ago..  She has treated hypertension and hyperlipidemia, mildly depressed left ventricular ejection fraction (EF 50% by echo) without clinical heart failure, chronic left bundle branch block and a mildly abnormal myocardial perfusion study (small to moderate reversible anteroseptal septal and apical perfusion defect felt to be consistent with LAD ischemia superimposed on underlying LBBB artifact).  CHADSVasc score is 4. No history of stroke/TIA or bleeding problems.  Past Medical History:  Diagnosis Date  . Anginal pain   . Chronic anticoagulation, CHAD2Vasc score 4 02/09/2014  . Chronic diastolic CHF (congestive heart failure)    a. 01/2014 Echo: EF 50-55%, Gr1 DD,  mild AI/MR.  . First degree atrioventricular block   . GERD (gastroesophageal reflux disease)   . Heart murmur   . HTN (hypertension)   . Hyperlipidemia   . LBBB (left bundle branch block)   . PAF (paroxysmal atrial fibrillation)    a. Previously on flecainide;  b. 01/2014 sotalol loaded and dccv performed;  c. Chronic Savaysa Rx.  . Presence of permanent cardiac pacemaker    a. 12/26/12 s/p MDT DC PPM.  . SSS (sick sinus syndrome)    a. s/p PPM-Medtronic placed 12/26/12   . Symptomatic sinus bradycardia     Past Surgical History:  Procedure Laterality Date  . APPENDECTOMY  1971  . BREAST LUMPECTOMY Right 1980's?   "benign"  . CARDIOVERSION N/A 01/22/2014   Procedure: CARDIOVERSION;  Surgeon: Chrystie NoseKenneth C. Hilty, MD;  [facility];  Service: Cardiovascular;  Laterality: N/A;  . INSERT / REPLACE / REMOVE PACEMAKER  12/26/2012   Medtronic, Dr. Royann Shiversroitoru  . NM MYOCAR PERF WALL MOTION  08/05/2009   small to mod. reversible anteroseptal,septal,& apical perfusion defect. EF 59%.  Marland Kitchen. PERMANENT PACEMAKER INSERTION N/A 12/26/2012   Procedure: PERMANENT PACEMAKER INSERTION;  Surgeon: Thurmon FairMihai Jackolyn Geron, MD;  Location: [facility] CATH LAB;  Service: Cardiovascular;  Laterality: N/A;  . TONSILLECTOMY AND ADENOIDECTOMY  1960's  . TUBAL LIGATION  1971  . VAGINAL HYSTERECTOMY  1980's    Current Medications: Outpatient Medications Prior to Visit  Medication Sig Dispense Refill  . acetaminophen (TYLENOL) 325 MG tablet Take 1-2 tablets (325-650 mg total) by mouth every 4 (four) hours as needed for mild pain.    .Marland Kitchen  edoxaban (SAVAYSA) 30 MG TABS tablet Take 1 tablet (30 mg total) by mouth daily. NEEDS APPOINTMENT FOR FUTURE REFILLS OR 90-DAY SUPPLY 30 tablet 0  . Pitavastatin Calcium (LIVALO) 1 MG TABS Take 1 tablet (1 mg total) by mouth daily. NEEDS APPOINTMENT FOR FUTURE REFILLS OR 90-DAY SUPPLY 30 tablet 0  . sotalol (BETAPACE) 80 MG tablet Take 1 tablet (80 mg total) by mouth daily. NEEDS APPOINTMENT  FOR FUTURE REFILLS OR 90-DAY SUPPLY 30 tablet 0  . valsartan (DIOVAN) 160 MG tablet Take 1 tablet (160 mg total) by mouth daily. 30 tablet 0  . amitriptyline (ELAVIL) 25 MG tablet TAKE 1 TABLET AT BEDTIME (Patient not taking: Reported on 09/01/2015) 90 tablet 1   No facility-administered medications prior to visit.      Allergies:   Codeine   Social History   Social History  . Marital status: Married    Spouse name: N/A  . Number of children: N/A  . Years of education: N/A   Social History Main Topics  . Smoking status: Never Smoker  . Smokeless tobacco: Never Used  . Alcohol use No  . Drug use: No  . Sexual activity: Not Currently   Other Topics Concern  . Not on file   Social History Narrative  . No narrative on file     Family History:  The patient's family history includes Heart attack in her mother; Stroke in her sister.   ROS:   Please see the history of present illness.    ROS All other systems reviewed and are negative.   PHYSICAL EXAM:   VS:  BP 136/78 (BP Location: Right Arm, Patient Position: Sitting, Cuff Size: Normal)   Pulse 71   Ht 5\' 3"  (1.6 m)   Wt 141 lb (64 kg)   SpO2 97%   BMI 24.98 kg/m    GEN: Well nourished, well developed, in no acute distress  HEENT: normal  Neck: no JVD, carotid bruits, or masses Cardiac: RRR, Paradoxically split S2; no murmurs, rubs, or gallops,no edema , Healthy subclavian pacemaker site  Respiratory:  clear to auscultation bilaterally, normal work of breathing GI: soft, nontender, nondistended, + BS MS: no deformity or atrophy  Skin: warm and dry, no rash Neuro:  Alert and Oriented x 3, Strength and sensation are intact Psych: euthymic mood, full affect  Wt Readings from Last 3 Encounters:  09/01/15 141 lb (64 kg)  06/08/14 157 lb 14.4 oz (71.6 kg)  02/09/14 156 lb 14.4 oz (71.2 kg)      Studies/Labs Reviewed:   EKG:  EKG is ordered today.  The ekg ordered today demonstrates Atrial paced, ventricular sensed  rhythm, left bundle branch block, left axis deviation. QRS 136 ms, QTC 473 ms.  Recent Labs: 09/01/2015: ALT 21; BUN 14; Creat 1.11; Hemoglobin 14.2; Platelets 275; Potassium 4.8; Sodium 141   Lipid Panel    Component Value Date/Time   CHOL 168 09/01/2015 0954   TRIG 124 09/01/2015 0954   HDL 66 09/01/2015 0954   CHOLHDL 2.5 09/01/2015 0954   VLDL 25 09/01/2015 0954   LDLCALC 77 09/01/2015 0954      ASSESSMENT:    1. Chronic diastolic heart failure (HCC)   2. Atrial fibrillation with RVR (HCC)   3. Chronic anticoagulation, CHAD2Vasc score 4   4. SSS (sick sinus syndrome) (HCC)   5. Pacemaker   6. Encounter for monitoring sotalol therapy   7. Hyperlipidemia      PLAN:  In order of problems  listed above:  1. CHF: Acute exacerbation only occurred during an episode of assistant atrial fibrillation. Currently appears to be euvolemic and has excellent functional status, asymptomatic 2. AFib: Very few and very brief events have been recorded over the last year. She is tolerating sotalol well.  3. Compliant with anticoagulant, no bleeding problems 4. SSS: has almost 100% atrial pacing, favorable heart rate histogram distribution and no symptoms of chronotropic incompetence. Also has a left bundle branch block but has never required ventricular pacing. 5. PPM: Normal device function, remote monitoring every 3 months and office visit at least yearly, preferably every 6 months as long as she is on sotalol 6. Sotalol: Check renal parameters and electrolytes twice yearly. QTC interval is not significantly prolonged, especially taking into account the fact that she has a underlying left bundle blanch block. Reminded her of the risk of drug interactions. 7. HLP: Time to reevaluate her lipid profile. She was intolerant to most statins but seems to be doing well with Livalo at a very low dose (had myalgia at 2 mg daily).    Medication Adjustments/Labs and Tests Ordered: Current medicines  are reviewed at length with the patient today.  Concerns regarding medicines are outlined above.  Medication changes, Labs and Tests ordered today are listed in the Patient Instructions below. Patient Instructions  Dr Royann Shivers recommends that you continue on your current medications as directed. Please refer to the Current Medication list given to you today.  Your physician recommends that you return for lab work at your convenience - FASTING.  Remote monitoring is used to monitor your Pacemaker of ICD from home. This monitoring reduces the number of office visits required to check your device to one time per year. It allows [place] to keep an eye on the functioning of your device to ensure it is working properly. You are scheduled for a device check from home on Thursday, November 23rd, 2017. You may send your transmission at any time that day. If you have a wireless device, the transmission will be sent automatically. After your physician reviews your transmission, you will receive a postcard with your next transmission date.  Dr Royann Shivers recommends that you schedule a follow-up appointment in 12 months with a device check. You will receive a reminder letter in the mail two months in advance. If you don't receive a letter, please call our office to schedule the follow-up appointment.  If you need a refill on your cardiac medications before your next appointment, please call your pharmacy.    Signed, Thurmon Fair, MD  09/03/2015 3:32 PM    [facility] [address] [place], [place], [place]  [address] Phone: [phone]; Fax: [phone]

## 2015-09-01 NOTE — Patient Instructions (Signed)
Dr Royann Shivers recommends that you continue on your current medications as directed. Please refer to the Current Medication list given to you today.  Your physician recommends that you return for lab work at your convenience - FASTING.  Remote monitoring is used to monitor your Pacemaker of ICD from home. This monitoring reduces the number of office visits required to check your device to one time per year. It allows [place] to keep an eye on the functioning of your device to ensure it is working properly. You are scheduled for a device check from home on Thursday, November 23rd, 2017. You may send your transmission at any time that day. If you have a wireless device, the transmission will be sent automatically. After your physician reviews your transmission, you will receive a postcard with your next transmission date.  Dr Royann Shivers recommends that you schedule a follow-up appointment in 12 months with a device check. You will receive a reminder letter in the mail two months in advance. If you don't receive a letter, please call our office to schedule the follow-up appointment.  If you need a refill on your cardiac medications before your next appointment, please call your pharmacy.

## 2015-09-03 ENCOUNTER — Encounter: Payer: Self-pay | Admitting: Cardiovascular Disease

## 2015-09-03 DIAGNOSIS — I495 Sick sinus syndrome: Secondary | ICD-10-CM | POA: Insufficient documentation

## 2015-09-06 LAB — CUP PACEART INCLINIC DEVICE CHECK
Battery Remaining Longevity: 89 mo
Brady Statistic AP VS Percent: 98.23 %
Brady Statistic RA Percent Paced: 98.27 %
Brady Statistic RV Percent Paced: 0.04 %
Date Time Interrogation Session: 20170824132157
Implantable Lead Implant Date: 20141219
Implantable Lead Location: 753859
Implantable Lead Model: 5076
Lead Channel Impedance Value: 380 Ohm
Lead Channel Impedance Value: 399 Ohm
Lead Channel Pacing Threshold Amplitude: 0.75 V
Lead Channel Pacing Threshold Amplitude: 0.75 V
Lead Channel Sensing Intrinsic Amplitude: 9 mV
Lead Channel Sensing Intrinsic Amplitude: 9 mV
Lead Channel Setting Pacing Amplitude: 2 V
Lead Channel Setting Pacing Amplitude: 2.5 V
Lead Channel Setting Pacing Pulse Width: 0.4 ms
MDC IDC LEAD IMPLANT DT: 20141219
MDC IDC LEAD LOCATION: 753860
MDC IDC MSMT BATTERY VOLTAGE: 3.01 V
MDC IDC MSMT LEADCHNL RA IMPEDANCE VALUE: 361 Ohm
MDC IDC MSMT LEADCHNL RA PACING THRESHOLD PULSEWIDTH: 0.4 ms
MDC IDC MSMT LEADCHNL RA SENSING INTR AMPL: 3.25 mV
MDC IDC MSMT LEADCHNL RA SENSING INTR AMPL: 3.25 mV
MDC IDC MSMT LEADCHNL RV IMPEDANCE VALUE: 475 Ohm
MDC IDC MSMT LEADCHNL RV PACING THRESHOLD PULSEWIDTH: 0.4 ms
MDC IDC SET LEADCHNL RV SENSING SENSITIVITY: 0.9 mV
MDC IDC STAT BRADY AP VP PERCENT: 0.04 %
MDC IDC STAT BRADY AS VP PERCENT: 0 %
MDC IDC STAT BRADY AS VS PERCENT: 1.73 %

## 2015-09-09 ENCOUNTER — Encounter: Payer: Self-pay | Admitting: Cardiovascular Disease

## 2015-10-14 ENCOUNTER — Other Ambulatory Visit: Payer: Self-pay | Admitting: *Deleted

## 2015-10-14 DIAGNOSIS — Z1231 Encounter for screening mammogram for malignant neoplasm of breast: Secondary | ICD-10-CM

## 2015-11-11 ENCOUNTER — Ambulatory Visit
Admission: RE | Admit: 2015-11-11 | Discharge: 2015-11-11 | Disposition: A | Discharge status: Home / Self Care | Payer: Medicare Other | Source: Ambulatory Visit | Attending: *Deleted | Admitting: *Deleted

## 2015-11-11 DIAGNOSIS — Z1231 Encounter for screening mammogram for malignant neoplasm of breast: Secondary | ICD-10-CM

## 2016-04-18 ENCOUNTER — Encounter (HOSPITAL_COMMUNITY): Payer: Self-pay | Admitting: Emergency Medicine

## 2016-04-18 ENCOUNTER — Emergency Department (HOSPITAL_COMMUNITY): Payer: Medicare Other

## 2016-04-18 ENCOUNTER — Emergency Department (HOSPITAL_COMMUNITY)
Admission: EM | Admit: 2016-04-18 | Discharge: 2016-04-18 | Disposition: A | Discharge status: Home / Self Care | Payer: Medicare Other | Attending: Emergency Medicine | Admitting: Emergency Medicine

## 2016-04-18 DIAGNOSIS — R112 Nausea with vomiting, unspecified: Secondary | ICD-10-CM | POA: Diagnosis present

## 2016-04-18 DIAGNOSIS — Z950 Presence of cardiac pacemaker: Secondary | ICD-10-CM | POA: Insufficient documentation

## 2016-04-18 DIAGNOSIS — R1111 Vomiting without nausea: Secondary | ICD-10-CM

## 2016-04-18 DIAGNOSIS — J069 Acute upper respiratory infection, unspecified: Secondary | ICD-10-CM | POA: Diagnosis not present

## 2016-04-18 DIAGNOSIS — Z79899 Other long term (current) drug therapy: Secondary | ICD-10-CM | POA: Insufficient documentation

## 2016-04-18 DIAGNOSIS — I110 Hypertensive heart disease with heart failure: Secondary | ICD-10-CM | POA: Insufficient documentation

## 2016-04-18 DIAGNOSIS — I5042 Chronic combined systolic (congestive) and diastolic (congestive) heart failure: Secondary | ICD-10-CM | POA: Diagnosis not present

## 2016-04-18 LAB — COMPREHENSIVE METABOLIC PANEL
ALBUMIN: 3.4 g/dL — AB (ref 3.5–5.0)
ALT: 75 U/L — AB (ref 14–54)
AST: 34 U/L (ref 15–41)
Alkaline Phosphatase: 100 U/L (ref 38–126)
Anion gap: 9 (ref 5–15)
BUN: 32 mg/dL — AB (ref 6–20)
CHLORIDE: 101 mmol/L (ref 101–111)
CO2: 23 mmol/L (ref 22–32)
Calcium: 9.7 mg/dL (ref 8.9–10.3)
Creatinine, Ser: 1.33 mg/dL — ABNORMAL HIGH (ref 0.44–1.00)
GFR calc Af Amer: 44 mL/min — ABNORMAL LOW (ref >60)
GFR calc non Af Amer: 38 mL/min — ABNORMAL LOW (ref >60)
Glucose, Bld: 110 mg/dL — ABNORMAL HIGH (ref 65–99)
POTASSIUM: 4.6 mmol/L (ref 3.5–5.1)
SODIUM: 133 mmol/L — AB (ref 135–145)
Total Bilirubin: 0.7 mg/dL (ref 0.3–1.2)
Total Protein: 6 g/dL — ABNORMAL LOW (ref 6.5–8.1)

## 2016-04-18 LAB — CBC
HEMATOCRIT: 42.6 % (ref 36.0–46.0)
Hemoglobin: 14.8 g/dL (ref 12.0–15.0)
MCH: 31.2 pg (ref 26.0–34.0)
MCHC: 34.7 g/dL (ref 30.0–36.0)
MCV: 89.7 fL (ref 78.0–100.0)
Platelets: 265 10*3/uL (ref 150–400)
RBC: 4.75 MIL/uL (ref 3.87–5.11)
RDW: 12.8 % (ref 11.5–15.5)
WBC: 10.2 10*3/uL (ref 4.0–10.5)

## 2016-04-18 LAB — URINALYSIS, ROUTINE W REFLEX MICROSCOPIC
BACTERIA UA: NONE SEEN
BILIRUBIN URINE: NEGATIVE
Glucose, UA: NEGATIVE mg/dL
Hgb urine dipstick: NEGATIVE
KETONES UR: NEGATIVE mg/dL
Nitrite: NEGATIVE
Protein, ur: NEGATIVE mg/dL
Specific Gravity, Urine: 1.021 (ref 1.005–1.030)
pH: 6 (ref 5.0–8.0)

## 2016-04-18 LAB — LIPASE, BLOOD: LIPASE: 33 U/L (ref 11–51)

## 2016-04-18 MED ORDER — ONDANSETRON 4 MG PO TBDP
ORAL_TABLET | ORAL | Status: AC
  Filled 2016-04-18: qty 2

## 2016-04-18 MED ORDER — ONDANSETRON 4 MG PO TBDP
4.0000 mg | ORAL_TABLET | Freq: Once | ORAL | Status: AC | PRN
  Administered 2016-04-18: 4 mg via ORAL

## 2016-04-18 MED ORDER — SODIUM CHLORIDE 0.9 % IV BOLUS (SEPSIS)
1000.0000 mL | Freq: Once | INTRAVENOUS | Status: AC
  Administered 2016-04-18: 1000 mL via INTRAVENOUS

## 2016-04-18 MED ORDER — ONDANSETRON 4 MG PO TBDP: 4.0000 mg | ORAL_TABLET | Freq: Three times a day (TID) | ORAL | 0 refills | Status: DC | PRN

## 2016-04-18 NOTE — ED Triage Notes (Signed)
Patient reports generalized abdominal pain " pressure / discomfort" with nausea and emesis onset yesterday , denies fever or diarrhea . Pt. added constipation this week .

## 2016-04-18 NOTE — ED Provider Notes (Signed)
[facility]-EMERGENCY DEPT Provider Note   CSN: [id number] Arrival date & time: 04/18/16  4098  By signing my name below, I, Monica Harvey, attest that this documentation has been prepared under the direction and in the presence of [name], MD.  Electronically Signed: [name], ED Scribe. 04/18/16. 4:00 AM.    History   Chief Complaint Chief Complaint  Patient presents with  . Abdominal Pain   The history is provided by the patient. No language interpreter was used.   HPI Comments: Monica Harvey is a 76 y.o. female who has a PMhx of anginal pain, CHF, GERD, LBBB, HTN, PAF, and sinus bradycardia presents to the Emergency Department complaining of generalized cold like symptoms x 2 weeks.  Pt reports associated nausea, vomiting, nasal congestion, constipation, and weakness. She was seen by [facility] two weeks ago for her symptoms. She was diagnosed with a sinus infection and was prescribed Augmentin. Pt says that the medication did not alleviate her symptoms so she went back and was prescribed Levofloxacin. She notes taking mucinex earlier this evening and notes her abdominal discomfort started shortly after. Reports diffuse abdominal pain.  Crampy in nature.  Currently pain free.Pt denies chest pain, shortness of breath, sneezing, and watery eyes.  Denies recent fevers.  States "I just don't feel good."  Past Medical History:  Diagnosis Date  . Anginal pain (HCC)   . Chronic anticoagulation, CHAD2Vasc score 4 02/09/2014  . Chronic diastolic CHF (congestive heart failure) (HCC)    a. 01/2014 Echo: EF 50-55%, Gr1 DD, mild AI/MR.  . First degree atrioventricular block   . GERD (gastroesophageal reflux disease)   . Heart murmur   . HTN (hypertension)   . Hyperlipidemia   . LBBB (left bundle branch block)   . PAF (paroxysmal atrial fibrillation) (HCC)    a. Previously on flecainide;  b. 01/2014 sotalol loaded and dccv performed;  c. Chronic Savaysa Rx.  . Presence of permanent cardiac  pacemaker    a. 12/26/12 s/p MDT DC PPM.  . SSS (sick sinus syndrome) (HCC)    a. s/p PPM-Medtronic placed 12/26/12   . Symptomatic sinus bradycardia     Patient Active Problem List   Diagnosis Date Noted  . SSS (sick sinus syndrome) (HCC) 09/03/2015  . Chronic anticoagulation, CHAD2Vasc score 4 02/09/2014  . Chronic combined systolic and diastolic CHF, NYHA class 1 (HCC) 02/09/2014  . Tachy-brady syndrome (HCC) 01/25/2014  . Chronic renal insufficiency, stage III (moderate) 01/24/2014  . Atrial fibrillation with RVR (HCC)   . Chronic diastolic heart failure (HCC) 01/21/2014  . Pacemaker- MDT implanted 12/26/12 12/26/2012  . Symptomatic sinus bradycardia 12/23/2012  . LBBB (left bundle branch block) 12/23/2012  . First degree atrioventricular block 12/23/2012  . PAF (paroxysmal atrial fibrillation) (HCC) 12/23/2012  . Hyperlipidemia 12/23/2012  . HTN (hypertension) 12/23/2012    Past Surgical History:  Procedure Laterality Date  . APPENDECTOMY  1971  . BREAST LUMPECTOMY Right 1980's?   "benign"  . CARDIOVERSION N/A 01/22/2014   Procedure: CARDIOVERSION;  Surgeon: Chrystie Nose, MD;  Location: [facility] ENDOSCOPY;  Service: Cardiovascular;  Laterality: N/A;  . INSERT / REPLACE / REMOVE PACEMAKER  12/26/2012   Medtronic, Dr. Royann Shivers  . NM MYOCAR PERF WALL MOTION  08/05/2009   small to mod. reversible anteroseptal,septal,& apical perfusion defect. EF 59%.  Marland Kitchen PERMANENT PACEMAKER INSERTION N/A 12/26/2012   Procedure: PERMANENT PACEMAKER INSERTION;  Surgeon: Thurmon Fair, MD;  Location: [facility] CATH LAB;  Service: Cardiovascular;  Laterality: N/A;  .  TONSILLECTOMY AND ADENOIDECTOMY  1960's  . TUBAL LIGATION  1971  . VAGINAL HYSTERECTOMY  1980's    OB History    No data available       Home Medications    Prior to Admission medications   Medication Sig Start Date End Date Taking? Authorizing Provider  edoxaban (SAVAYSA) 30 MG TABS tablet Take 1 tablet (30 mg total) by mouth  daily. 09/01/15  Yes Mihai Croitoru, MD  Pitavastatin Calcium (LIVALO) 1 MG TABS Take 1 tablet (1 mg total) by mouth daily. 09/01/15  Yes Mihai Croitoru, MD  sotalol (BETAPACE) 80 MG tablet Take 1 tablet (80 mg total) by mouth daily. 09/01/15  Yes Mihai Croitoru, MD  valsartan (DIOVAN) 160 MG tablet Take 1 tablet (160 mg total) by mouth daily. 09/01/15  Yes Mihai Croitoru, MD  acetaminophen (TYLENOL) 325 MG tablet Take 1-2 tablets (325-650 mg total) by mouth every 4 (four) hours as needed for mild pain. Patient not taking: Reported on 04/18/2016 12/27/12   [name], PA-C  ondansetron (ZOFRAN ODT) 4 MG disintegrating tablet Take 1 tablet (4 mg total) by mouth every 8 (eight) hours as needed for nausea or vomiting. 04/18/16   [name], MD    Family History Family History  Problem Relation Age of Onset  . Heart attack Mother   . Stroke Sister     Social History Social History  Substance Use Topics  . Smoking status: Never Smoker  . Smokeless tobacco: Never Used  . Alcohol use No     Allergies   Codeine   Review of Systems Review of Systems  Constitutional: Negative for fever.  HENT: Positive for congestion. Negative for sneezing.   Eyes: Negative for discharge.  Respiratory: Positive for cough. Negative for shortness of breath.   Cardiovascular: Negative for chest pain.  Gastrointestinal: Positive for abdominal pain, constipation, diarrhea, nausea and vomiting.  All other systems reviewed and are negative.    Physical Exam Updated Vital Signs BP (!) 154/92   Pulse 65   Temp 97.8 F (36.6 C) (Oral)   Resp 20   Ht 5\' 3"  (1.6 m)   Wt 143 lb (64.9 kg)   SpO2 97%   BMI 25.33 kg/m   Physical Exam  Constitutional: She is oriented to person, place, and time. No distress.  HENT:  Head: Normocephalic and atraumatic.  Mouth/Throat: Oropharynx is clear and moist. No oropharyngeal exudate.  MM dry  Eyes: Pupils are equal, round, and reactive to light.  Neck: Neck  supple.  Cardiovascular: Normal rate, regular rhythm and normal heart sounds.   Pulmonary/Chest: Effort normal and breath sounds normal. No respiratory distress. She has no wheezes.  Abdominal: Soft. Bowel sounds are normal. There is no tenderness. There is no guarding.  Lymphadenopathy:    She has no cervical adenopathy.  Neurological: She is alert and oriented to person, place, and time.  Skin: Skin is warm and dry.  Psychiatric: She has a normal mood and affect.  Nursing note and vitals reviewed.    ED Treatments / Results  DIAGNOSTIC STUDIES: Oxygen Saturation is 97% on RA, normal by my interpretation.  COORDINATION OF CARE:  3:57 AM Discussed treatment plan with pt at bedside and pt agreed to plan.  Labs (all labs ordered are listed, but only abnormal results are displayed) Labs Reviewed  COMPREHENSIVE METABOLIC PANEL - Abnormal; Notable for the following:       Result Value   Sodium 133 (*)    Glucose,  Bld 110 (*)    BUN 32 (*)    Creatinine, Ser 1.33 (*)    Total Protein 6.0 (*)    Albumin 3.4 (*)    ALT 75 (*)    GFR calc non Af Amer 38 (*)    GFR calc Af Amer 44 (*)    All other components within normal limits  URINALYSIS, ROUTINE W REFLEX MICROSCOPIC - Abnormal; Notable for the following:    Leukocytes, UA TRACE (*)    Squamous Epithelial / LPF 0-5 (*)    All other components within normal limits  LIPASE, BLOOD  CBC    EKG  EKG Interpretation  Date/Time:  Wednesday April 18 2016 00:46:19 EDT Ventricular Rate:  67 PR Interval:  176 QRS Duration: 134 QT Interval:  430 QTC Calculation: 454 R Axis:   6 Text Interpretation:  Atrial-paced rhythm Left bundle branch block Abnormal ECG Confirmed by HORTON  MD, COURTNEY ([id number]) on 04/18/2016 5:19:14 AM       Radiology Dg Abdomen Acute W/chest  Result Date: 04/18/2016 CLINICAL DATA:  Acute onset of shortness of breath. Epigastric abdominal pain and constipation. Initial encounter. EXAM: DG ABDOMEN ACUTE W/  1V CHEST COMPARISON:  Chest radiograph performed 01/21/2014 FINDINGS: The lungs are well-aerated. Mild peribronchial thickening is noted. There is no evidence of focal opacification, pleural effusion or pneumothorax. The cardiomediastinal silhouette is within normal limits. A pacemaker is noted overlying the left chest wall, with leads ending overlying the right atrium and right ventricle. The visualized bowel gas pattern is unremarkable. Scattered stool and air are seen within the colon; there is no evidence of small bowel dilatation to suggest obstruction. No free intra-abdominal air is identified on the provided upright view. No acute osseous abnormalities are seen; the sacroiliac joints are unremarkable in appearance. IMPRESSION: 1. Unremarkable bowel gas pattern; no free intra-abdominal air seen. Small to moderate amount of stool noted in the colon. 2. Mild peribronchial thickening noted.  Lungs otherwise clear. Electronically Signed   By: Roanna Raider M.D.   On: 04/18/2016 05:15    Procedures Procedures (including critical care time)  Medications Ordered in ED Medications  ondansetron (ZOFRAN-ODT) disintegrating tablet 4 mg (4 mg Oral Given 04/18/16 0105)  sodium chloride 0.9 % bolus 1,000 mL (0 mLs Intravenous Stopped 04/18/16 0607)     Initial Impression / Assessment and Plan / ED Course  I have reviewed the triage vital signs and the nursing notes.  Pertinent labs & imaging results that were available during my care of the patient were reviewed by me and considered in my medical decision making (see chart for details).     Patient presents UR symptoms and new onset vomting and crampy abdominal pain after started levaquin.  She is nontoxic.  VS reassuring.  Dry but otherwise benign exam.  Patient initially given zofran and fluids.  Labwork only remarkable for mild hyponatremia and AKI which may reflect dehydration.  EKG and urine reassuring.  AAS neg.  Suspect viral etiology.  VOmiting  might be related to antibiotic use.  Patient reassured by work-up.  F/u with primary physician.  After history, exam, and medical workup I feel the patient has been appropriately medically screened and is safe for discharge home. Pertinent diagnoses were discussed with the patient. Patient was given return precautions.   Final Clinical Impressions(s) / ED Diagnoses   Final diagnoses:  Viral upper respiratory tract infection  Non-intractable vomiting without nausea, unspecified vomiting type   I personally performed the services  described in this documentation, which was scribed in my presence. The recorded information has been reviewed and is accurate.  New Prescriptions New Prescriptions   ONDANSETRON (ZOFRAN ODT) 4 MG DISINTEGRATING TABLET    Take 1 tablet (4 mg total) by mouth every 8 (eight) hours as needed for nausea or vomiting.     [name], MD 04/27/16 [phone]

## 2016-04-18 NOTE — ED Notes (Signed)
Patient transported to X-ray 

## 2016-04-18 NOTE — Discharge Instructions (Signed)
You were seen today for ongoing upper respiratory symptoms as well as vomiting. Your workup is reassuring. You are currently on antibiotics are appropriate for pneumonia. Some of her symptoms may be related to viral calls which is why you have ongoing symptoms. Continue supportive measures at home.

## 2016-04-19 ENCOUNTER — Encounter (HOSPITAL_COMMUNITY): Payer: Self-pay

## 2016-04-19 DIAGNOSIS — Z79899 Other long term (current) drug therapy: Secondary | ICD-10-CM | POA: Insufficient documentation

## 2016-04-19 DIAGNOSIS — I130 Hypertensive heart and chronic kidney disease with heart failure and stage 1 through stage 4 chronic kidney disease, or unspecified chronic kidney disease: Secondary | ICD-10-CM | POA: Diagnosis not present

## 2016-04-19 DIAGNOSIS — N183 Chronic kidney disease, stage 3 (moderate): Secondary | ICD-10-CM | POA: Diagnosis not present

## 2016-04-19 DIAGNOSIS — K5900 Constipation, unspecified: Secondary | ICD-10-CM | POA: Insufficient documentation

## 2016-04-19 DIAGNOSIS — Z950 Presence of cardiac pacemaker: Secondary | ICD-10-CM | POA: Insufficient documentation

## 2016-04-19 DIAGNOSIS — I5042 Chronic combined systolic (congestive) and diastolic (congestive) heart failure: Secondary | ICD-10-CM | POA: Diagnosis not present

## 2016-04-19 NOTE — ED Triage Notes (Signed)
Pt endorses constipation x 1 week. Pt was recently diagnosed with viral infection/possible pneumonia and placed on levaquin and pt states "I think the levaquin caused my constipation" VSS. Pt tried metamucil and glycerin suppository without relief. Last BM 3 days ago.

## 2016-04-20 ENCOUNTER — Encounter (HOSPITAL_COMMUNITY): Payer: Self-pay | Admitting: Emergency Medicine

## 2016-04-20 ENCOUNTER — Emergency Department (HOSPITAL_COMMUNITY)
Admission: EM | Admit: 2016-04-20 | Discharge: 2016-04-20 | Disposition: A | Discharge status: Home / Self Care | Payer: Medicare Other | Attending: Emergency Medicine | Admitting: Emergency Medicine

## 2016-04-20 ENCOUNTER — Emergency Department (HOSPITAL_COMMUNITY): Payer: Medicare Other

## 2016-04-20 DIAGNOSIS — K5900 Constipation, unspecified: Secondary | ICD-10-CM

## 2016-04-20 MED ORDER — POLYETHYLENE GLYCOL 3350 17 G PO PACK: 17.0000 g | PACK | Freq: Every day | ORAL | 0 refills | Status: DC

## 2016-04-20 MED ORDER — DOCUSATE SODIUM 100 MG PO CAPS
100.0000 mg | ORAL_CAPSULE | Freq: Once | ORAL | Status: AC
  Administered 2016-04-20: 100 mg via ORAL
  Filled 2016-04-20: qty 1

## 2016-04-20 MED ORDER — POLYETHYLENE GLYCOL 3350 17 G PO PACK
17.0000 g | PACK | Freq: Every day | ORAL | Status: DC
  Administered 2016-04-20: 17 g via ORAL
  Filled 2016-04-20: qty 1

## 2016-04-20 NOTE — ED Provider Notes (Signed)
[facility]EM[facility]Y DEPT Provider Note   CSN: [id number] Arrival date & time: 04/19/16  2321  By signing my name below, I, Monica Harvey, attest that this documentation has been prepared under the direction and in the presence of Monica Pinkley, MD. Electronically Signed: Elder Harvey, Scribe. 04/20/16. 3:30 AM.   History   Chief Complaint Chief Complaint  Patient presents with  . Constipation    HPI Monica Harvey is a 76 y.o. female with history of diastolic HF, HTN, HLD, sick sinus syndrome with PPM, and chronic sotalol who presents to the ED with constipation. This patient presented 2 days ago at this facility reporting constipation. Received an x-ray abdomen which was negative. Discharged. She has attempted two glycerin suppositories at home without relief. She is denying any abdominal pain currently.    The history is provided by the patient. No language interpreter was used.  Constipation   This is a new problem. The current episode started more than 1 week ago (4). The stool is described as pellet like. Pertinent negatives include no abdominal pain and no dysuria. There is no fiber in the patient's diet. She does not exercise regularly. There has not been adequate water intake. Treatments tried: suppositories. The treatment provided no relief. Risk factors: none.    Past Medical History:  Diagnosis Date  . Anginal pain (HCC)   . Chronic anticoagulation, CHAD2Vasc score 4 02/09/2014  . Chronic diastolic CHF (congestive heart failure) (HCC)    a. 01/2014 Echo: EF 50-55%, Gr1 DD, mild AI/MR.  . First degree atrioventricular block   . GERD (gastroesophageal reflux disease)   . Heart murmur   . HTN (hypertension)   . Hyperlipidemia   . LBBB (left bundle branch block)   . PAF (paroxysmal atrial fibrillation) (HCC)    a. Previously on flecainide;  b. 01/2014 sotalol loaded and dccv performed;  c. Chronic Savaysa Rx.  . Presence of permanent cardiac pacemaker    a. 12/26/12 s/p  MDT DC PPM.  . SSS (sick sinus syndrome) (HCC)    a. s/p PPM-Medtronic placed 12/26/12   . Symptomatic sinus bradycardia     Patient Active Problem List   Diagnosis Date Noted  . SSS (sick sinus syndrome) (HCC) 09/03/2015  . Chronic anticoagulation, CHAD2Vasc score 4 02/09/2014  . Chronic combined systolic and diastolic CHF, NYHA class 1 (HCC) 02/09/2014  . Tachy-brady syndrome (HCC) 01/25/2014  . Chronic renal insufficiency, stage III (moderate) 01/24/2014  . Atrial fibrillation with RVR (HCC)   . Chronic diastolic heart failure (HCC) 01/21/2014  . Pacemaker- MDT implanted 12/26/12 12/26/2012  . Symptomatic sinus bradycardia 12/23/2012  . LBBB (left bundle branch block) 12/23/2012  . First degree atrioventricular block 12/23/2012  . PAF (paroxysmal atrial fibrillation) (HCC) 12/23/2012  . Hyperlipidemia 12/23/2012  . HTN (hypertension) 12/23/2012    Past Surgical History:  Procedure Laterality Date  . APPENDECTOMY  1971  . BREAST LUMPECTOMY Right 1980's?   "benign"  . CARDIOVERSION N/A 01/22/2014   Procedure: CARDIOVERSION;  Surgeon: Chrystie Nose, MD;  Location: [facility] ENDOSCOPY;  Service: Cardiovascular;  Laterality: N/A;  . INSERT / REPLACE / REMOVE PACEMAKER  12/26/2012   Medtronic, Dr. Royann Shivers  . NM MYOCAR PERF WALL MOTION  08/05/2009   small to mod. reversible anteroseptal,septal,& apical perfusion defect. EF 59%.  Marland Kitchen PERMANENT PACEMAKER INSERTION N/A 12/26/2012   Procedure: PERMANENT PACEMAKER INSERTION;  Surgeon: Thurmon Fair, MD;  Location: [facility] CATH LAB;  Service: Cardiovascular;  Laterality: N/A;  . TONSILLECTOMY AND ADENOIDECTOMY  1960's  . TUBAL LIGATION  1971  . VAGINAL HYSTERECTOMY  1980's    OB History    No data available       Home Medications    Prior to Admission medications   Medication Sig Start Date End Date Taking? Authorizing Provider  acetaminophen (TYLENOL) 325 MG tablet Take 1-2 tablets (325-650 mg total) by mouth every 4 (four) hours as  needed for mild pain. Patient not taking: Reported on 04/18/2016 12/27/12   [name], PA-C  edoxaban (SAVAYSA) 30 MG TABS tablet Take 1 tablet (30 mg total) by mouth daily. 09/01/15   Mihai Croitoru, MD  ondansetron (ZOFRAN ODT) 4 MG disintegrating tablet Take 1 tablet (4 mg total) by mouth every 8 (eight) hours as needed for nausea or vomiting. 04/18/16   [name], MD  Pitavastatin Calcium (LIVALO) 1 MG TABS Take 1 tablet (1 mg total) by mouth daily. 09/01/15   Mihai Croitoru, MD  sotalol (BETAPACE) 80 MG tablet Take 1 tablet (80 mg total) by mouth daily. 09/01/15   Mihai Croitoru, MD  valsartan (DIOVAN) 160 MG tablet Take 1 tablet (160 mg total) by mouth daily. 09/01/15   Thurmon Fair, MD    Family History Family History  Problem Relation Age of Onset  . Heart attack Mother   . Stroke Sister     Social History Social History  Substance Use Topics  . Smoking status: Never Smoker  . Smokeless tobacco: Never Used  . Alcohol use No     Allergies   Codeine   Review of Systems Review of Systems  Constitutional: Negative for chills and fever.  HENT: Negative for ear pain and sore throat.   Eyes: Negative for pain and visual disturbance.  Respiratory: Negative for cough and shortness of breath.   Cardiovascular: Negative for chest pain and palpitations.  Gastrointestinal: Positive for constipation. Negative for abdominal pain, anal bleeding, diarrhea, nausea, rectal pain and vomiting.  Genitourinary: Negative for dysuria and hematuria.  Musculoskeletal: Negative for arthralgias and back pain.  Skin: Negative for color change and rash.  Neurological: Negative for seizures and syncope.  All other systems reviewed and are negative.    Physical Exam Updated Vital Signs BP 105/60   Pulse 61   Temp 97.9 F (36.6 C) (Oral)   Resp 20   Ht 5\' 3"  (1.6 m)   Wt 143 lb (64.9 kg)   SpO2 96%   BMI 25.33 kg/m   Physical Exam  Constitutional: She is oriented to person,  place, and time. She appears well-developed and well-nourished. No distress.  HENT:  Head: Normocephalic and atraumatic.  Mouth/Throat: Oropharynx is clear and moist. No oropharyngeal exudate.  Eyes: Conjunctivae are normal. Pupils are equal, round, and reactive to light.  Neck: Normal range of motion. Neck supple. No JVD present. Carotid bruit is not present.  Cardiovascular: Normal rate, regular rhythm, normal heart sounds and intact distal pulses.   No murmur heard. Pulmonary/Chest: Effort normal and breath sounds normal. No stridor. No respiratory distress. She has no wheezes. She has no rales.  Abdominal: Soft. Bowel sounds are normal. She exhibits no distension and no mass. There is no tenderness. There is no rebound and no guarding.  Musculoskeletal: Normal range of motion. She exhibits no edema.  Neurological: She is alert and oriented to person, place, and time. She displays normal reflexes.  Skin: Skin is warm and dry. Capillary refill takes less than 2 seconds.  Psychiatric: She has a normal mood and  affect.  Nursing note and vitals reviewed.    ED Treatments / Results   Vitals:   04/20/16 0215 04/20/16 0330  BP: 105/60 111/66  Pulse: 61 64  Resp:  18  Temp:      Radiology Dg Abdomen Acute W/chest  Result Date: 04/18/2016 CLINICAL DATA:  Acute onset of shortness of breath. Epigastric abdominal pain and constipation. Initial encounter. EXAM: DG ABDOMEN ACUTE W/ 1V CHEST COMPARISON:  Chest radiograph performed 01/21/2014 FINDINGS: The lungs are well-aerated. Mild peribronchial thickening is noted. There is no evidence of focal opacification, pleural effusion or pneumothorax. The cardiomediastinal silhouette is within normal limits. A pacemaker is noted overlying the left chest wall, with leads ending overlying the right atrium and right ventricle. The visualized bowel gas pattern is unremarkable. Scattered stool and air are seen within the colon; there is no evidence of small  bowel dilatation to suggest obstruction. No free intra-abdominal air is identified on the provided upright view. No acute osseous abnormalities are seen; the sacroiliac joints are unremarkable in appearance. IMPRESSION: 1. Unremarkable bowel gas pattern; no free intra-abdominal air seen. Small to moderate amount of stool noted in the colon. 2. Mild peribronchial thickening noted.  Lungs otherwise clear. Electronically Signed   By: Roanna Raider M.D.   On: 04/18/2016 05:15    Procedures Procedures (including critical care time)  Medications Ordered in ED Medications  polyethylene glycol (MIRALAX / GLYCOLAX) packet 17 g (17 g Oral Given 04/20/16 0335)  docusate sodium (COLACE) capsule 100 mg (100 mg Oral Given 04/20/16 0335)      Final Clinical Impressions(s) / ED Diagnoses  Constipation:  No signs of obstruction, no pain and only used 2 suppositories.  Start Miralax BID x 7 days then daily and follow up with your PMD. I suspect medication non compliance.  Exam, and vitals are benign and reassuring.The patient is nontoxic-appearing and imaging is negative for acute finding. Return for abdominal pain, vomiting, rigid abdomen, rectal bleeding or any concerns  After history, exam, and medical workup I feel the patient has been appropriately medically screened and is safe for discharge home. Pertinent diagnoses were discussed with the patient. Patient was given return precautions.  I personally performed the services described in this documentation, which was scribed in my presence. The recorded information has been reviewed and is accurate.    Cy Blamer, MD 04/20/16 [id number]

## 2016-07-20 ENCOUNTER — Other Ambulatory Visit: Payer: Self-pay | Admitting: Cardiovascular Disease

## 2016-08-03 ENCOUNTER — Telehealth: Payer: Self-pay | Admitting: Pharmacist Clinician (PhC)/ Clinical Pharmacy Specialist

## 2016-08-03 MED ORDER — CANDESARTAN CILEXETIL 16 MG PO TABS: 16.0000 mg | ORAL_TABLET | Freq: Every day | ORAL | 1 refills | Status: DC

## 2016-08-03 NOTE — Telephone Encounter (Signed)
Pt has recalled valsartan and CHF (systolic and diastolic).  Will switch valsartan 160 mg to candesartan 16 mg.  Patient aware of change.  Will contact us if problems with copay

## 2016-08-30 ENCOUNTER — Ambulatory Visit (INDEPENDENT_AMBULATORY_CARE_PROVIDER_SITE_OTHER): Payer: Medicare Other | Admitting: Cardiovascular Disease

## 2016-08-30 VITALS — BP 100/60 | HR 72 | Ht 63.0 in | Wt 142.0 lb

## 2016-08-30 DIAGNOSIS — Z5181 Encounter for therapeutic drug level monitoring: Secondary | ICD-10-CM

## 2016-08-30 DIAGNOSIS — I4891 Unspecified atrial fibrillation: Secondary | ICD-10-CM | POA: Diagnosis not present

## 2016-08-30 DIAGNOSIS — Z79899 Other long term (current) drug therapy: Secondary | ICD-10-CM | POA: Diagnosis not present

## 2016-08-30 DIAGNOSIS — I10 Essential (primary) hypertension: Secondary | ICD-10-CM

## 2016-08-30 DIAGNOSIS — Z950 Presence of cardiac pacemaker: Secondary | ICD-10-CM

## 2016-08-30 DIAGNOSIS — Z7901 Long term (current) use of anticoagulants: Secondary | ICD-10-CM

## 2016-08-30 DIAGNOSIS — I5042 Chronic combined systolic (congestive) and diastolic (congestive) heart failure: Secondary | ICD-10-CM | POA: Diagnosis not present

## 2016-08-30 DIAGNOSIS — E785 Hyperlipidemia, unspecified: Secondary | ICD-10-CM | POA: Diagnosis not present

## 2016-08-30 DIAGNOSIS — R001 Bradycardia, unspecified: Secondary | ICD-10-CM

## 2016-08-30 NOTE — Patient Instructions (Signed)
Dr Royann Shivers recommends that you continue on your current medications as directed. Please refer to the Current Medication list given to you today.  Your physician recommends that you return for lab work in OCTOBER 2018 - FASTING.  Remote monitoring is used to monitor your Pacemaker of ICD from home. This monitoring reduces the number of office visits required to check your device to one time per year. It allows [place] to keep an eye on the functioning of your device to ensure it is working properly. You are scheduled for a device check from home on Monday, November 26th, 2018. You may send your transmission at any time that day. If you have a wireless device, the transmission will be sent automatically. After your physician reviews your transmission, you will receive a postcard with your next transmission date.  Dr Royann Shivers recommends that you schedule a follow-up appointment in 6 months with a pacemaker check. You will receive a reminder letter in the mail two months in advance. If you don't receive a letter, please call our office to schedule the follow-up appointment.  If you need a refill on your cardiac medications before your next appointment, please call your pharmacy.

## 2016-08-30 NOTE — Progress Notes (Signed)
.    Cardiology Office Note    Date:  09/01/2016   ID:  Monica Harvey, DOB Dec 25, 1940, MRN [id number]  PCP:  System, Pcp Not In  Cardiologist:   Thurmon Fair, MD   Chief Complaint  Patient presents with  . Follow-up    pt has no complaints     History of Present Illness:  Monica Harvey is a 76 y.o. female with tachycardia/bradycardia syndrome who presents for pacemaker follow up and paroxysmal atrial fibrillation. She required cardioversion for a lengthy (3-4 weeks) episode of atrial fibrillation in January 2016 and is on chronic treatment with sotalol and an oral anticoagulant.  She has not been aware of any arrhythmia, even though her device shows occasional persistent atrial fibrillation. She had a 9 day and episode of atrial fibrillation in July that has subsequently resolved. The overall burden recorded by her pacemaker is roughly 3%. The average ventricular rate during atrial fibrillation was around 105 bpm. She has not had any bleeding complications while on treatment with Savaysa and has not had any focal neurological events. She also denies angina or dyspnea with exertion, syncope, dizziness, leg edema, intermittent claudication or other cardiovascular complaints. Has never had a clinical stroke or TIA.   Pacemaker check shows normal device function. She has 96% atrial pacing, only 0.1% ventricular pacing. The device was implanted in 2014 and has 6.5 years of expected longevity (Medtronic Advisa). Her device shows excellent activity level around 5 hours a day, unchanged from past. No high ventricular rates have been recorded.   She has treated hypertension and hyperlipidemia, mildly depressed left ventricular ejection fraction (EF 50% by echo) without clinical heart failure, chronic left bundle branch block and a mildly abnormal myocardial perfusion study (small to moderate reversible anteroseptal septal and apical perfusion defect felt to be consistent with LAD ischemia  superimposed on underlying LBBB artifact).    Past Medical History:  Diagnosis Date  . Anginal pain (HCC)   . Chronic anticoagulation, CHAD2Vasc score 4 02/09/2014  . Chronic diastolic CHF (congestive heart failure) (HCC)    a. 01/2014 Echo: EF 50-55%, Gr1 DD, mild AI/MR.  . First degree atrioventricular block   . GERD (gastroesophageal reflux disease)   . Heart murmur   . HTN (hypertension)   . Hyperlipidemia   . LBBB (left bundle branch block)   . PAF (paroxysmal atrial fibrillation) (HCC)    a. Previously on flecainide;  b. 01/2014 sotalol loaded and dccv performed;  c. Chronic Savaysa Rx.  . Presence of permanent cardiac pacemaker    a. 12/26/12 s/p MDT DC PPM.  . SSS (sick sinus syndrome) (HCC)    a. s/p PPM-Medtronic placed 12/26/12   . Symptomatic sinus bradycardia     Past Surgical History:  Procedure Laterality Date  . APPENDECTOMY  1971  . BREAST LUMPECTOMY Right 1980's?   "benign"  . CARDIOVERSION N/A 01/22/2014   Procedure: CARDIOVERSION;  Surgeon: Chrystie Nose, MD;  Location: [facility] ENDOSCOPY;  Service: Cardiovascular;  Laterality: N/A;  . INSERT / REPLACE / REMOVE PACEMAKER  12/26/2012   Medtronic, Dr. Royann Shivers  . NM MYOCAR PERF WALL MOTION  08/05/2009   small to mod. reversible anteroseptal,septal,& apical perfusion defect. EF 59%.  Marland Kitchen PERMANENT PACEMAKER INSERTION N/A 12/26/2012   Procedure: PERMANENT PACEMAKER INSERTION;  Surgeon: Thurmon Fair, MD;  Location: [facility] CATH LAB;  Service: Cardiovascular;  Laterality: N/A;  . TONSILLECTOMY AND ADENOIDECTOMY  1960's  . TUBAL LIGATION  1971  . VAGINAL HYSTERECTOMY  1980's    Current Medications: Outpatient Medications Prior to Visit  Medication Sig Dispense Refill  . acetaminophen (TYLENOL) 325 MG tablet Take 1-2 tablets (325-650 mg total) by mouth every 4 (four) hours as needed for mild pain. (Patient not taking: Reported on 04/18/2016)    . candesartan (ATACAND) 16 MG tablet Take 1 tablet (16 mg total) by mouth  daily. 30 tablet 1  . ondansetron (ZOFRAN ODT) 4 MG disintegrating tablet Take 1 tablet (4 mg total) by mouth every 8 (eight) hours as needed for nausea or vomiting. 20 tablet 0  . Pitavastatin Calcium (LIVALO) 1 MG TABS Take 1 tablet (1 mg total) by mouth daily. 90 tablet 3  . polyethylene glycol (MIRALAX) packet Take 17 g by mouth daily. 14 each 0  . SAVAYSA 30 MG TABS tablet TAKE 1 TABLET DAILY 90 tablet 0  . sotalol (BETAPACE) 80 MG tablet TAKE 1 TABLET DAILY 90 tablet 0   No facility-administered medications prior to visit.      Allergies:   Codeine   Social History   Social History  . Marital status: Married    Spouse name: N/A  . Number of children: N/A  . Years of education: N/A   Social History Main Topics  . Smoking status: Never Smoker  . Smokeless tobacco: Never Used  . Alcohol use No  . Drug use: No  . Sexual activity: Not Currently   Other Topics Concern  . Not on file   Social History Narrative  . No narrative on file     Family History:  The patient's family history includes Heart attack in her mother; Stroke in her sister.   ROS:   Please see the history of present illness.    ROS All other systems reviewed and are negative.   PHYSICAL EXAM:   VS:  BP 100/60   Pulse 72   Ht 5\' 3"  (1.6 m)   Wt 142 lb (64.4 kg)   BMI 25.15 kg/m     General: Alert, oriented x3, no distress Head: no evidence of trauma, PERRL, EOMI, no exophtalmos or lid lag, no myxedema, no xanthelasma; normal ears, nose and oropharynx Neck: normal jugular venous pulsations and no hepatojugular reflux; brisk carotid pulses without delay and no carotid bruits Chest: clear to auscultation, no signs of consolidation by percussion or palpation, normal fremitus, symmetrical and full respiratory excursions Cardiovascular: normal position and quality of the apical impulse, regular rhythm, normal first and second heart sounds, no murmurs, rubs or gallops. Subclavian pacemaker site is  healthy. Abdomen: no tenderness or distention, no masses by palpation, no abnormal pulsatility or arterial bruits, normal bowel sounds, no hepatosplenomegaly Extremities: no clubbing, cyanosis or edema; 2+ radial, ulnar and brachial pulses bilaterally; 2+ right femoral, posterior tibial and dorsalis pedis pulses; 2+ left femoral, posterior tibial and dorsalis pedis pulses; no subclavian or femoral bruits Neurological: grossly nonfocal Psych: Normal mood and affect   Wt Readings from Last 3 Encounters:  08/30/16 142 lb (64.4 kg)  04/19/16 143 lb (64.9 kg)  04/18/16 143 lb (64.9 kg)      Studies/Labs Reviewed:   EKG:  EKG is ordered today.  EKG ordered today shows atrial paced, ventricular sensed rhythm, chronic left bundle branch block (QRS 136 ms), QTC 470 ms  Recent Labs: 04/18/2016: ALT 75; BUN 32; Creatinine, Ser 1.33; Hemoglobin 14.8; Platelets 265; Potassium 4.6; Sodium 133   Lipid Panel    Component Value Date/Time   CHOL 168 09/01/2015 0954  TRIG 124 09/01/2015 0954   HDL 66 09/01/2015 0954   CHOLHDL 2.5 09/01/2015 0954   VLDL 25 09/01/2015 0954   LDLCALC 77 09/01/2015 0954      ASSESSMENT:    1. Atrial fibrillation with RVR (HCC)   2. Encounter for monitoring sotalol therapy   3. Long term current use of anticoagulant   4. Symptomatic sinus bradycardia   5. Pacemaker   6. Dyslipidemia   7. Essential hypertension   8. Chronic combined systolic and diastolic CHF, NYHA class 1 (HCC)      PLAN:  In order of problems listed above:  1. AFib: The episodes are infrequent, but can last for several days as a time. Rate control is acceptable albeit not perfect. Clinically they are not symptomatic. CHADSVasc 4 (age, gender, HTN, possible CAD). 2. Sotalol: She is tolerating sotalol well. Reminded of the risk of drug-induced reactions and to ask about other medications effect on QT when she receives new prescriptions. Needs electrolytes, renal function and ECG checked  every 6 months. 3. Savaysa: Compliant with anticoagulant, no bleeding problems 4. SSS: has almost 100% atrial pacing, favorable heart rate histogram distribution and no symptoms of chronotropic incompetence. Also has a left bundle branch block but has never required ventricular pacing. 5. PPM: Normal device function, remote monitoring every 3 months and office visit at least yearly, preferably every 6 months as long as she is on sotalol 6. HLP: Time to reevaluate her lipid profile. She was intolerant to most statins but seems to be doing well with Livalo at a very low dose (had myalgia at 2 mg daily). LDL of 77 was very close to target range (<70) a year ago when we checked it. 7. HTN: Well-controlled. 8. CHF: previous acute exacerbation only occurred during an episode of persistent atrial fibrillation, but the 9-day episode that occurred in July 2018 did not lead to decompensation. Currently appears to be euvolemic and has excellent functional status, asymptomatic. She does not appear to require daily diuretics and it is preferable to avoid these in view of her sotalol use   Medication Adjustments/Labs and Tests Ordered: Current medicines are reviewed at length with the patient today.  Concerns regarding medicines are outlined above.  Medication changes, Labs and Tests ordered today are listed in the Patient Instructions below. Patient Instructions  Dr Royann Shivers recommends that you continue on your current medications as directed. Please refer to the Current Medication list given to you today.  Your physician recommends that you return for lab work in OCTOBER 2018 - FASTING.  Remote monitoring is used to monitor your Pacemaker of ICD from home. This monitoring reduces the number of office visits required to check your device to one time per year. It allows [place] to keep an eye on the functioning of your device to ensure it is working properly. You are scheduled for a device check from home on Monday,  November 26th, 2018. You may send your transmission at any time that day. If you have a wireless device, the transmission will be sent automatically. After your physician reviews your transmission, you will receive a postcard with your next transmission date.  Dr Royann Shivers recommends that you schedule a follow-up appointment in 6 months with a pacemaker check. You will receive a reminder letter in the mail two months in advance. If you don't receive a letter, please call our office to schedule the follow-up appointment.  If you need a refill on your cardiac medications before your next appointment,  please call your pharmacy.    Signed, Thurmon Fair, MD  09/01/2016 5:24 PM    [facility] [address] [place], [place], [place]  [address] Phone: [phone]; Fax: [phone]

## 2016-09-01 ENCOUNTER — Encounter: Payer: Self-pay | Admitting: Cardiovascular Disease

## 2016-09-26 ENCOUNTER — Other Ambulatory Visit: Payer: Self-pay | Admitting: Cardiovascular Disease

## 2016-10-18 ENCOUNTER — Other Ambulatory Visit: Payer: Self-pay | Admitting: *Deleted

## 2016-10-18 DIAGNOSIS — Z1231 Encounter for screening mammogram for malignant neoplasm of breast: Secondary | ICD-10-CM

## 2016-10-23 ENCOUNTER — Other Ambulatory Visit: Payer: Self-pay | Admitting: Cardiovascular Disease

## 2016-10-24 ENCOUNTER — Other Ambulatory Visit: Payer: Self-pay | Admitting: *Deleted

## 2016-10-24 MED ORDER — CANDESARTAN CILEXETIL 16 MG PO TABS: 16.0000 mg | ORAL_TABLET | Freq: Every day | ORAL | 3 refills | Status: DC

## 2016-11-06 LAB — BASIC METABOLIC PANEL
BUN/Creatinine Ratio: 13 (ref 12–28)
BUN: 13 mg/dL (ref 8–27)
CALCIUM: 9.9 mg/dL (ref 8.7–10.3)
CO2: 27 mmol/L (ref 20–29)
Chloride: 104 mmol/L (ref 96–106)
Creatinine, Ser: 0.98 mg/dL (ref 0.57–1.00)
GFR, EST AFRICAN AMERICAN: 66 mL/min/{1.73_m2} (ref >59)
GFR, EST NON AFRICAN AMERICAN: 57 mL/min/{1.73_m2} — AB (ref >59)
Glucose: 84 mg/dL (ref 65–99)
POTASSIUM: 5.2 mmol/L (ref 3.5–5.2)
SODIUM: 143 mmol/L (ref 134–144)

## 2016-11-06 LAB — LIPID PANEL
CHOL/HDL RATIO: 2.8 ratio (ref 0.0–4.4)
Cholesterol, Total: 175 mg/dL (ref 100–199)
HDL: 63 mg/dL (ref >39)
LDL CALC: 92 mg/dL (ref 0–99)
Triglycerides: 99 mg/dL (ref 0–149)
VLDL Cholesterol Cal: 20 mg/dL (ref 5–40)

## 2016-11-13 ENCOUNTER — Ambulatory Visit
Admission: RE | Admit: 2016-11-13 | Discharge: 2016-11-13 | Disposition: A | Discharge status: Home / Self Care | Payer: Medicare Other | Source: Ambulatory Visit | Attending: *Deleted | Admitting: *Deleted

## 2016-11-13 DIAGNOSIS — Z1231 Encounter for screening mammogram for malignant neoplasm of breast: Secondary | ICD-10-CM

## 2016-12-03 ENCOUNTER — Encounter: Payer: Medicare Other | Admitting: *Deleted

## 2016-12-03 ENCOUNTER — Telehealth: Payer: Self-pay | Admitting: Cardiology

## 2016-12-03 NOTE — Telephone Encounter (Signed)
LMOVM reminding pt to send remote transmission.   

## 2016-12-06 ENCOUNTER — Encounter: Payer: Self-pay | Admitting: Cardiology

## 2017-01-03 ENCOUNTER — Other Ambulatory Visit: Payer: Self-pay | Admitting: Cardiovascular Disease

## 2017-02-26 LAB — CUP PACEART INCLINIC DEVICE CHECK
Date Time Interrogation Session: 20190219152120
Implantable Lead Implant Date: 20141219
Implantable Lead Location: 753860
Implantable Lead Model: 5076
MDC IDC LEAD IMPLANT DT: 20141219
MDC IDC LEAD LOCATION: 753859
MDC IDC PG IMPLANT DT: 20141219

## 2017-04-05 ENCOUNTER — Other Ambulatory Visit: Payer: Self-pay | Admitting: Cardiovascular Disease

## 2017-04-05 ENCOUNTER — Telehealth: Payer: Self-pay | Admitting: *Deleted

## 2017-04-05 NOTE — Telephone Encounter (Signed)
Patient left a msg on the refill vm requesting a refill on valsartan 160 mg be sent to [facility]. She reports that she does not tolerate the candesartan well. She would like a call back at [phone]. Thanks, MI

## 2017-04-07 ENCOUNTER — Encounter (HOSPITAL_COMMUNITY): Payer: Self-pay | Admitting: Emergency Medicine

## 2017-04-07 ENCOUNTER — Emergency Department (HOSPITAL_COMMUNITY): Payer: Medicare Other

## 2017-04-07 ENCOUNTER — Inpatient Hospital Stay (HOSPITAL_COMMUNITY)
Admission: EM | Admit: 2017-04-07 | Discharge: 2017-04-11 | DRG: 291 | Disposition: A | Discharge status: Home / Self Care | Payer: Medicare Other | Attending: Internal Medicine | Admitting: Internal Medicine

## 2017-04-07 DIAGNOSIS — I5033 Acute on chronic diastolic (congestive) heart failure: Secondary | ICD-10-CM

## 2017-04-07 DIAGNOSIS — I351 Nonrheumatic aortic (valve) insufficiency: Secondary | ICD-10-CM | POA: Diagnosis not present

## 2017-04-07 DIAGNOSIS — Z7901 Long term (current) use of anticoagulants: Secondary | ICD-10-CM

## 2017-04-07 DIAGNOSIS — N183 Chronic kidney disease, stage 3 unspecified: Secondary | ICD-10-CM | POA: Diagnosis present

## 2017-04-07 DIAGNOSIS — E785 Hyperlipidemia, unspecified: Secondary | ICD-10-CM | POA: Diagnosis present

## 2017-04-07 DIAGNOSIS — Z950 Presence of cardiac pacemaker: Secondary | ICD-10-CM

## 2017-04-07 DIAGNOSIS — I361 Nonrheumatic tricuspid (valve) insufficiency: Secondary | ICD-10-CM | POA: Diagnosis not present

## 2017-04-07 DIAGNOSIS — I4891 Unspecified atrial fibrillation: Secondary | ICD-10-CM | POA: Diagnosis present

## 2017-04-07 DIAGNOSIS — Z885 Allergy status to narcotic agent status: Secondary | ICD-10-CM

## 2017-04-07 DIAGNOSIS — I481 Persistent atrial fibrillation: Secondary | ICD-10-CM | POA: Diagnosis present

## 2017-04-07 DIAGNOSIS — Z79899 Other long term (current) drug therapy: Secondary | ICD-10-CM

## 2017-04-07 DIAGNOSIS — I447 Left bundle-branch block, unspecified: Secondary | ICD-10-CM | POA: Diagnosis present

## 2017-04-07 DIAGNOSIS — I495 Sick sinus syndrome: Secondary | ICD-10-CM | POA: Diagnosis present

## 2017-04-07 DIAGNOSIS — I340 Nonrheumatic mitral (valve) insufficiency: Secondary | ICD-10-CM | POA: Diagnosis not present

## 2017-04-07 DIAGNOSIS — N179 Acute kidney failure, unspecified: Secondary | ICD-10-CM | POA: Diagnosis present

## 2017-04-07 DIAGNOSIS — I081 Rheumatic disorders of both mitral and tricuspid valves: Secondary | ICD-10-CM | POA: Diagnosis present

## 2017-04-07 DIAGNOSIS — I429 Cardiomyopathy, unspecified: Secondary | ICD-10-CM | POA: Diagnosis present

## 2017-04-07 DIAGNOSIS — Z8249 Family history of ischemic heart disease and other diseases of the circulatory system: Secondary | ICD-10-CM | POA: Diagnosis not present

## 2017-04-07 DIAGNOSIS — I130 Hypertensive heart and chronic kidney disease with heart failure and stage 1 through stage 4 chronic kidney disease, or unspecified chronic kidney disease: Secondary | ICD-10-CM | POA: Diagnosis present

## 2017-04-07 DIAGNOSIS — I10 Essential (primary) hypertension: Secondary | ICD-10-CM | POA: Diagnosis not present

## 2017-04-07 DIAGNOSIS — I5021 Acute systolic (congestive) heart failure: Secondary | ICD-10-CM | POA: Diagnosis present

## 2017-04-07 DIAGNOSIS — R0600 Dyspnea, unspecified: Secondary | ICD-10-CM

## 2017-04-07 LAB — BASIC METABOLIC PANEL
Anion gap: 9 (ref 5–15)
BUN: 26 mg/dL — ABNORMAL HIGH (ref 6–20)
CHLORIDE: 106 mmol/L (ref 101–111)
CO2: 21 mmol/L — ABNORMAL LOW (ref 22–32)
Calcium: 9.5 mg/dL (ref 8.9–10.3)
Creatinine, Ser: 1.36 mg/dL — ABNORMAL HIGH (ref 0.44–1.00)
GFR calc non Af Amer: 37 mL/min — ABNORMAL LOW (ref >60)
GFR, EST AFRICAN AMERICAN: 43 mL/min — AB (ref >60)
Glucose, Bld: 103 mg/dL — ABNORMAL HIGH (ref 65–99)
POTASSIUM: 4.8 mmol/L (ref 3.5–5.1)
SODIUM: 136 mmol/L (ref 135–145)

## 2017-04-07 LAB — CBC WITH DIFFERENTIAL/PLATELET
Basophils Absolute: 0 10*3/uL (ref 0.0–0.1)
Basophils Relative: 0 %
EOS PCT: 2 %
Eosinophils Absolute: 0.3 10*3/uL (ref 0.0–0.7)
HEMATOCRIT: 40.9 % (ref 36.0–46.0)
Hemoglobin: 13.5 g/dL (ref 12.0–15.0)
LYMPHS ABS: 2.7 10*3/uL (ref 0.7–4.0)
LYMPHS PCT: 19 %
MCH: 31.2 pg (ref 26.0–34.0)
MCHC: 33 g/dL (ref 30.0–36.0)
MCV: 94.5 fL (ref 78.0–100.0)
MONO ABS: 1.5 10*3/uL — AB (ref 0.1–1.0)
MONOS PCT: 11 %
NEUTROS ABS: 9.9 10*3/uL — AB (ref 1.7–7.7)
Neutrophils Relative %: 68 %
PLATELETS: 248 10*3/uL (ref 150–400)
RBC: 4.33 MIL/uL (ref 3.87–5.11)
RDW: 14.5 % (ref 11.5–15.5)
WBC: 14.4 10*3/uL — ABNORMAL HIGH (ref 4.0–10.5)

## 2017-04-07 LAB — I-STAT CG4 LACTIC ACID, ED
Lactic Acid, Venous: 1.52 mmol/L (ref 0.5–1.9)
Lactic Acid, Venous: 1.09 mmol/L (ref 0.5–1.9)

## 2017-04-07 LAB — I-STAT TROPONIN, ED: Troponin i, poc: 0 ng/mL (ref 0.00–0.08)

## 2017-04-07 LAB — MONONUCLEOSIS SCREEN: MONO SCREEN: NEGATIVE

## 2017-04-07 LAB — BRAIN NATRIURETIC PEPTIDE: B Natriuretic Peptide: 748.3 pg/mL — ABNORMAL HIGH (ref 0.0–100.0)

## 2017-04-07 LAB — INFLUENZA PANEL BY PCR (TYPE A & B)
INFLAPCR: NEGATIVE
Influenza B By PCR: NEGATIVE

## 2017-04-07 LAB — RAPID STREP SCREEN (MED CTR MEBANE ONLY): Streptococcus, Group A Screen (Direct): NEGATIVE

## 2017-04-07 MED ORDER — DILTIAZEM HCL 25 MG/5ML IV SOLN
5.0000 mg | Freq: Once | INTRAVENOUS | Status: DC
  Filled 2017-04-07: qty 5

## 2017-04-07 MED ORDER — SOTALOL HCL 80 MG PO TABS
80.0000 mg | ORAL_TABLET | Freq: Every day | ORAL | Status: DC
  Administered 2017-04-08: 80 mg via ORAL
  Filled 2017-04-07 (×2): qty 1

## 2017-04-07 MED ORDER — SODIUM CHLORIDE 0.9% FLUSH
3.0000 mL | Freq: Two times a day (BID) | INTRAVENOUS | Status: DC
Start: 2017-04-07 — End: 2017-04-11
  Administered 2017-04-08 – 2017-04-10 (×5): 3 mL via INTRAVENOUS

## 2017-04-07 MED ORDER — DILTIAZEM HCL-DEXTROSE 100-5 MG/100ML-% IV SOLN (PREMIX)
5.0000 mg/h | INTRAVENOUS | Status: DC
  Administered 2017-04-07 – 2017-04-08 (×2): 5 mg/h via INTRAVENOUS
  Filled 2017-04-07 (×2): qty 100

## 2017-04-07 MED ORDER — IPRATROPIUM-ALBUTEROL 0.5-2.5 (3) MG/3ML IN SOLN
3.0000 mL | Freq: Once | RESPIRATORY_TRACT | Status: DC
  Filled 2017-04-07: qty 3

## 2017-04-07 MED ORDER — SODIUM CHLORIDE 0.9 % IV SOLN
250.0000 mL | INTRAVENOUS | Status: DC | PRN
  Administered 2017-04-10: 12:00:00 via INTRAVENOUS

## 2017-04-07 MED ORDER — ALBUTEROL SULFATE (2.5 MG/3ML) 0.083% IN NEBU
2.5000 mg | INHALATION_SOLUTION | RESPIRATORY_TRACT | Status: DC | PRN
  Administered 2017-04-08 – 2017-04-09 (×5): 2.5 mg via RESPIRATORY_TRACT
  Filled 2017-04-07 (×5): qty 3

## 2017-04-07 MED ORDER — ONDANSETRON HCL 4 MG/2ML IJ SOLN: 4.0000 mg | Freq: Four times a day (QID) | INTRAMUSCULAR | Status: DC | PRN

## 2017-04-07 MED ORDER — FUROSEMIDE 10 MG/ML IJ SOLN
40.0000 mg | Freq: Two times a day (BID) | INTRAMUSCULAR | Status: DC
  Administered 2017-04-08 – 2017-04-09 (×3): 40 mg via INTRAVENOUS
  Filled 2017-04-07 (×5): qty 4

## 2017-04-07 MED ORDER — METHYLPREDNISOLONE SODIUM SUCC 125 MG IJ SOLR
125.0000 mg | Freq: Once | INTRAMUSCULAR | Status: AC
  Administered 2017-04-07: 125 mg via INTRAVENOUS
  Filled 2017-04-07: qty 2

## 2017-04-07 MED ORDER — DOXYCYCLINE HYCLATE 100 MG PO TABS
100.0000 mg | ORAL_TABLET | Freq: Once | ORAL | Status: AC
  Administered 2017-04-07: 100 mg via ORAL
  Filled 2017-04-07: qty 1

## 2017-04-07 MED ORDER — ALBUTEROL SULFATE (2.5 MG/3ML) 0.083% IN NEBU
5.0000 mg | INHALATION_SOLUTION | Freq: Once | RESPIRATORY_TRACT | Status: AC
  Administered 2017-04-07: 5 mg via RESPIRATORY_TRACT
  Filled 2017-04-07: qty 6

## 2017-04-07 MED ORDER — SODIUM CHLORIDE 0.9 % IV SOLN
1.0000 g | Freq: Once | INTRAVENOUS | Status: AC
  Administered 2017-04-07: 1 g via INTRAVENOUS
  Filled 2017-04-07: qty 10

## 2017-04-07 MED ORDER — SODIUM CHLORIDE 0.9% FLUSH: 3.0000 mL | INTRAVENOUS | Status: DC | PRN

## 2017-04-07 MED ORDER — ACETAMINOPHEN 325 MG PO TABS
650.0000 mg | ORAL_TABLET | ORAL | Status: DC | PRN
  Administered 2017-04-08 – 2017-04-09 (×2): 650 mg via ORAL
  Filled 2017-04-07 (×2): qty 2

## 2017-04-07 MED ORDER — IPRATROPIUM BROMIDE 0.02 % IN SOLN
0.5000 mg | Freq: Once | RESPIRATORY_TRACT | Status: AC
  Administered 2017-04-07: 0.5 mg via RESPIRATORY_TRACT
  Filled 2017-04-07: qty 2.5

## 2017-04-07 MED ORDER — PRAVASTATIN SODIUM 20 MG PO TABS
20.0000 mg | ORAL_TABLET | Freq: Every day | ORAL | Status: DC
  Administered 2017-04-08 – 2017-04-10 (×4): 20 mg via ORAL
  Filled 2017-04-07 (×3): qty 1

## 2017-04-07 MED ORDER — AZITHROMYCIN 250 MG PO TABS
250.0000 mg | ORAL_TABLET | Freq: Every day | ORAL | Status: AC
  Administered 2017-04-08 – 2017-04-09 (×2): 250 mg via ORAL
  Filled 2017-04-07 (×2): qty 1

## 2017-04-07 MED ORDER — IRBESARTAN 150 MG PO TABS
150.0000 mg | ORAL_TABLET | Freq: Every day | ORAL | Status: DC
  Administered 2017-04-09 – 2017-04-10 (×2): 150 mg via ORAL
  Filled 2017-04-07 (×5): qty 1

## 2017-04-07 MED ORDER — FUROSEMIDE 10 MG/ML IJ SOLN
20.0000 mg | Freq: Once | INTRAMUSCULAR | Status: AC
  Administered 2017-04-07: 20 mg via INTRAVENOUS
  Filled 2017-04-07: qty 2

## 2017-04-07 MED ORDER — EDOXABAN TOSYLATE 30 MG PO TABS
30.0000 mg | ORAL_TABLET | ORAL | Status: DC
  Administered 2017-04-08 – 2017-04-11 (×4): 30 mg via ORAL
  Filled 2017-04-07 (×5): qty 1

## 2017-04-07 NOTE — H&P (Signed)
History and Physical    Monica Harvey IWL:[id number] DOB: 28-Mar-1940 DOA: 04/07/2017  PCP: Marva Panda, NP   Patient coming from: Home  Chief Complaint: Dyspnea   HPI: Monica Harvey is a 77 y.o. female with medical history significant for atrial fibrillation on edoxaban, chronic diastolic CHF, and sick sinus syndrome with pacer, now presenting to the emergency department with 1-2 weeks of dyspnea and weight gain.  Patient reports that she developed shortness of breath more than a week ago, was evaluated by an outpatient clinician, diagnosed with infection, and started on azithromycin.  Her symptoms have continued to progress and she now seeks further evaluation in the ED.  She denies any fevers or chills, reports occasional cough productive of thick sputum, and denies chest pain.  Reports increased swelling over this interval.  Reports continued adherence with her medications.  Denies any history of COPD or asthma and states that she has never smoked.  ED Course: Upon arrival to the ED, patient is found to be afebrile, saturating adequately on room air, mildly tachypneic, and tachycardic to 140.  EKG features atrial fibrillation with RVR, rate 140, and nonspecific IVCD.  Chest x-ray is notable for cardiomegaly and pulmonary vascular congestion.  Chemistry panel reveals a creatinine of 1.36, similar to her priors in the 1-1.3 range.  CBC is notable for leukocytosis to 14,400.  Lactic acid is reassuringly normal.  Troponin is undetectable and BNP is elevated to 748.  Influenza PCR is negative.  Patient was treated with 125 mg of IV Solu-Medrol, duo nebs, Rocephin, doxycycline, and 20 mg IV Lasix in the ED.  She remains in atrial fibrillation with rapid rate and will be admitted to the stepdown unit for ongoing evaluation and management of acute on chronic diastolic CHF, possibly secondary to atrial fibrillation with RVR.   Review of Systems:  All other systems reviewed and apart from HPI,  are negative.  Past Medical History:  Diagnosis Date  . Anginal pain (HCC)   . Chronic anticoagulation, CHAD2Vasc score 4 02/09/2014  . Chronic diastolic CHF (congestive heart failure) (HCC)    a. 01/2014 Echo: EF 50-55%, Gr1 DD, mild AI/MR.  . First degree atrioventricular block   . GERD (gastroesophageal reflux disease)   . Heart murmur   . HTN (hypertension)   . Hyperlipidemia   . LBBB (left bundle branch block)   . PAF (paroxysmal atrial fibrillation) (HCC)    a. Previously on flecainide;  b. 01/2014 sotalol loaded and dccv performed;  c. Chronic Savaysa Rx.  . Presence of permanent cardiac pacemaker    a. 12/26/12 s/p MDT DC PPM.  . SSS (sick sinus syndrome) (HCC)    a. s/p PPM-Medtronic placed 12/26/12   . Symptomatic sinus bradycardia     Past Surgical History:  Procedure Laterality Date  . APPENDECTOMY  1971  . BREAST EXCISIONAL BIOPSY Right   . BREAST LUMPECTOMY Right 1980's?   "benign"  . CARDIOVERSION N/A 01/22/2014   Procedure: CARDIOVERSION;  Surgeon: Chrystie Nose, MD;  Location: [facility] ENDOSCOPY;  Service: Cardiovascular;  Laterality: N/A;  . INSERT / REPLACE / REMOVE PACEMAKER  12/26/2012   Medtronic, Dr. Royann Shivers  . NM MYOCAR PERF WALL MOTION  08/05/2009   small to mod. reversible anteroseptal,septal,& apical perfusion defect. EF 59%.  Marland Kitchen PERMANENT PACEMAKER INSERTION N/A 12/26/2012   Procedure: PERMANENT PACEMAKER INSERTION;  Surgeon: Thurmon Fair, MD;  Location: [facility] CATH LAB;  Service: Cardiovascular;  Laterality: N/A;  . TONSILLECTOMY AND ADENOIDECTOMY  1960's  .  TUBAL LIGATION  1971  . VAGINAL HYSTERECTOMY  1980's     reports that she has never smoked. She has never used smokeless tobacco. She reports that she does not drink alcohol or use drugs.  Allergies  Allergen Reactions  . Codeine Nausea And Vomiting    Family History  Problem Relation Age of Onset  . Heart attack Mother   . Stroke Sister      Prior to Admission medications   Medication  Sig Start Date End Date Taking? Authorizing Provider  acetaminophen (TYLENOL) 325 MG tablet Take 1-2 tablets (325-650 mg total) by mouth every 4 (four) hours as needed for mild pain. Patient taking differently: Take 500 mg by mouth every 4 (four) hours as needed for mild pain.  12/27/12  Yes Kilroy, Luke K, PA-C  azithromycin (ZITHROMAX) 250 MG tablet Take 250 mg by mouth daily.   Yes [provider]  LIVALO 1 MG TABS TAKE 1 TABLET DAILY Patient taking differently: TAKE 1mg  TABLET DAILY 10/24/16  Yes Croitoru, Mihai, MD  ondansetron (ZOFRAN ODT) 4 MG disintegrating tablet Take 1 tablet (4 mg total) by mouth every 8 (eight) hours as needed for nausea or vomiting. 04/18/16  Yes Horton, Mayer Masker, MD  SAVAYSA 30 MG TABS tablet TAKE 1 TABLET DAILY. MAKE  AN APPOINTMENT FOR FURTHER REFILLS Patient taking differently: TAKE 30 mg TABLET DAILY. MAKE  AN APPOINTMENT FOR FURTHER REFILLS 10/24/16  Yes Croitoru, Mihai, MD  sotalol (BETAPACE) 80 MG tablet TAKE 1 TABLET DAILY. MAKE  AN APPOINTMENT FOR FURTHER REFILLS Patient taking differently: TAKE 80 mg TABLET DAILY. MAKE  AN APPOINTMENT FOR FURTHER REFILLS 10/24/16  Yes Croitoru, Mihai, MD  valsartan (DIOVAN) 160 MG tablet Take 160 mg by mouth daily.   Yes [provider]  SAVAYSA 30 MG TABS tablet TAKE 1 TABLET DAILY Patient not taking: Reported on 04/07/2017 07/23/16   Thurmon Fair, MD    Physical Exam: Vitals:   04/07/17 1854 04/07/17 1900 04/07/17 1915 04/07/17 1930  BP:  101/84 99/62   Pulse: (!) 121 (!) 103 (!) 117 (!) 114  Resp: 20 19 19 14   Temp:      TempSrc:      SpO2: 97% 94% 94% 96%      Constitutional: NAD, calm  Eyes: PERTLA, lids and conjunctivae normal ENMT: Mucous membranes are moist. Posterior pharynx clear of any exudate or lesions.   Neck: normal, supple, no masses, no thyromegaly Respiratory: Fine rales at bases, no wheezing. Mild tachypnea. No accessory muscle use.  Cardiovascular: Rate ~120 and  irregular. 1+ pretibial edema bilaterally. Abdomen: No distension, no tenderness, soft. Bowel sounds normal.  Musculoskeletal: no clubbing / cyanosis. No joint deformity upper and lower extremities.   Skin: no significant rashes, lesions, ulcers. Warm, dry, well-perfused. Neurologic: CN 2-12 grossly intact. Sensation intact. Strength 5/5 in all 4 limbs.  Psychiatric: Alert and oriented x 3. Calm, cooperative.     Labs on Admission: I have personally reviewed following labs and imaging studies  CBC: Recent Labs  Lab 04/07/17 1605  WBC 14.4*  NEUTROABS 9.9*  HGB 13.5  HCT 40.9  MCV 94.5  PLT 248   Basic Metabolic Panel: Recent Labs  Lab 04/07/17 1605  NA 136  K 4.8  CL 106  CO2 21*  GLUCOSE 103*  BUN 26*  CREATININE 1.36*  CALCIUM 9.5   GFR: CrCl cannot be calculated (Unknown ideal weight.). Liver Function Tests: No results for input(s): AST, ALT, ALKPHOS, BILITOT, PROT, ALBUMIN in  the last 168 hours. No results for input(s): LIPASE, AMYLASE in the last 168 hours. No results for input(s): AMMONIA in the last 168 hours. Coagulation Profile: No results for input(s): INR, PROTIME in the last 168 hours. Cardiac Enzymes: No results for input(s): CKTOTAL, CKMB, CKMBINDEX, TROPONINI in the last 168 hours. BNP (last 3 results) No results for input(s): PROBNP in the last 8760 hours. HbA1C: No results for input(s): HGBA1C in the last 72 hours. CBG: No results for input(s): GLUCAP in the last 168 hours. Lipid Profile: No results for input(s): CHOL, HDL, LDLCALC, TRIG, CHOLHDL, LDLDIRECT in the last 72 hours. Thyroid Function Tests: No results for input(s): TSH, T4TOTAL, FREET4, T3FREE, THYROIDAB in the last 72 hours. Anemia Panel: No results for input(s): VITAMINB12, FOLATE, FERRITIN, TIBC, IRON, RETICCTPCT in the last 72 hours. Urine analysis:    Component Value Date/Time   COLORURINE YELLOW 04/18/2016 0105   APPEARANCEUR CLEAR 04/18/2016 0105   LABSPEC 1.021  04/18/2016 0105   PHURINE 6.0 04/18/2016 0105   GLUCOSEU NEGATIVE 04/18/2016 0105   HGBUR NEGATIVE 04/18/2016 0105   BILIRUBINUR NEGATIVE 04/18/2016 0105   KETONESUR NEGATIVE 04/18/2016 0105   PROTEINUR NEGATIVE 04/18/2016 0105   NITRITE NEGATIVE 04/18/2016 0105   LEUKOCYTESUR TRACE (A) 04/18/2016 0105   Sepsis Labs: @LABRCNTIP (procalcitonin:4,lacticidven:4) ) Recent Results (from the past 240 hour(s))  Rapid strep screen     Status: None   Collection Time: 04/07/17  5:57 PM  Result Value Ref Range Status   Streptococcus, Group A Screen (Direct) NEGATIVE NEGATIVE Final    Comment: (NOTE) A Rapid Antigen test may result negative if the antigen level in the sample is below the detection level of this test. The FDA has not cleared this test as a stand-alone test therefore the rapid antigen negative result has reflexed to a Group A Strep culture. Performed at [facility], [address], [place], [place] [address]      Radiological Exams on Admission: Dg Chest 2 View  Result Date: 04/07/2017 CLINICAL DATA:  Shortness of breath EXAM: CHEST - 2 VIEW COMPARISON:  04/20/2016 FINDINGS: Bilateral interstitial thickening. No focal consolidation, pleural effusion or pneumothorax. Stable mild cardiomegaly. Dual lead cardiac pacemaker. Thoracic aortic atherosclerosis. Dual lead cardiac pacemaker. IMPRESSION: Cardiomegaly with mild pulmonary vascular congestion. Electronically Signed   By: Elige Ko   On: 04/07/2017 16:33    EKG: Independently reviewed. Atrial fibrillation with RVR (rate 140), non-specific IVCD.   Assessment/Plan   1. Atrial fibrillation with RVR  - Presents with SOB, found to be in atrial fibrillation with RVR, rate 140  - Troponin is undetectable and no anginal complaints  - Recently diagnosed with respiratory infection and started on azithromycin; may have been precipitated by this  - She continues to have HR 120-130's at rest  - Start diltiazem infusion with  titration, continue sotalol, continue Sayvasa as CHADS-VASc is at least 5 (CHF, HTN age x2, gender)   2. Acute on chronic diastolic CHF  - Presents with SOB, noted to have bibasilar rales, vascular congestion on CXR, and BNP 750 much higher than priors  - Last echo was 2016, demonstrating preserved EF, grade 1 diastolic dysfunction, and mild MR and AR  - This may be rate-related  - Treated in ED with IV Lasix  - Continue diuresis with Lasix 40 mg IV q12h, continue ARB, SLIV, follow daily wt and I/O's, update echo    3. CKD stage III  - SCr is 1.36 on admission, similar to priors  -  Renally-dose medications, follow daily chem panel during diuresis     DVT prophylaxis: Savaysa  Code Status: Full  Family Communication: Discussed with patient Consults called: None Admission status: Inpatient    Briscoe Deutscher, MD [facility] Pager [phone]  If 7PM-7AM, please contact night-coverage [email or web address] Password [facility]  04/07/2017, 8:36 PM

## 2017-04-07 NOTE — ED Provider Notes (Signed)
[facility] EMERGENCY DEPARTMENT Provider Note   CSN: [id number] Arrival date & time: 04/07/17  1547     History   Chief Complaint Chief Complaint  Patient presents with  . Shortness of Breath    HPI Monica Harvey is a 77 y.o. female with a h/o of chronic diastolic CHF, HTN, HLD, presents to the emergency department with a chief complaint of dyspnea, orthopnea, cough, fever, chills, myalgias, and URI symptoms.   The patient endorses intermittent, gradually worsening dyspnea that has become constant since onset 2.5 weeks ago.  She reports that she is now short of breath when she is talking and with room to room activity.   She reports associated intermittently productive cough with green sputum, sore throat, tactile fever, nasal congestion, and myalgias.  She also endorses worsening orthopnea and swelling in her legs and abdomen.  She normally sleeps on 2 pillows, but has been sleeping on 4-5 over the last few nights.  She states that she does not check her weights on a daily basis, but had a hard time finding a pair of pants to fit today because all of them are so tight from the weight she is put on in her abdomen.  She also endorses mild swelling in her bilateral lower extremities.   She was seen by urgent care 9 days ago and was started on a 6-day course of prednisone.  Her symptoms were not improving so she followed back up with urgent care 5 days ago and was started on a Z-Pak.  She was told to follow-up with the emergency department if her symptoms worsened.  She states that instead of improving with the medications that her symptoms have continued to worsen.  She received her influenza vaccination this year.  No known sick contacts.   The history is provided by the patient. No language interpreter was used.  Shortness of Breath  Associated symptoms include a fever, sore throat, cough, wheezing and leg swelling. Pertinent negatives include no headaches, no  chest pain, no vomiting, no abdominal pain and no rash.    Past Medical History:  Diagnosis Date  . Anginal pain (HCC)   . Chronic anticoagulation, CHAD2Vasc score 4 02/09/2014  . Chronic diastolic CHF (congestive heart failure) (HCC)    a. 01/2014 Echo: EF 50-55%, Gr1 DD, mild AI/MR.  . First degree atrioventricular block   . GERD (gastroesophageal reflux disease)   . Heart murmur   . HTN (hypertension)   . Hyperlipidemia   . LBBB (left bundle branch block)   . PAF (paroxysmal atrial fibrillation) (HCC)    a. Previously on flecainide;  b. 01/2014 sotalol loaded and dccv performed;  c. Chronic Savaysa Rx.  . Presence of permanent cardiac pacemaker    a. 12/26/12 s/p MDT DC PPM.  . SSS (sick sinus syndrome) (HCC)    a. s/p PPM-Medtronic placed 12/26/12   . Symptomatic sinus bradycardia     Patient Active Problem List   Diagnosis Date Noted  . SSS (sick sinus syndrome) (HCC) 09/03/2015  . Long term current use of anticoagulant 02/09/2014  . Chronic combined systolic and diastolic CHF, NYHA class 1 (HCC) 02/09/2014  . Tachy-brady syndrome (HCC) 01/25/2014  . Chronic renal insufficiency, stage III (moderate) (HCC) 01/24/2014  . Atrial fibrillation with RVR (HCC)   . Acute on chronic diastolic CHF (congestive heart failure) (HCC) 01/21/2014  . Pacemaker- MDT implanted 12/26/12 12/26/2012  . Symptomatic sinus bradycardia 12/23/2012  . LBBB (left bundle branch block)  12/23/2012  . First degree atrioventricular block 12/23/2012  . PAF (paroxysmal atrial fibrillation) (HCC) 12/23/2012  . Hyperlipidemia 12/23/2012  . HTN (hypertension) 12/23/2012    Past Surgical History:  Procedure Laterality Date  . APPENDECTOMY  1971  . BREAST EXCISIONAL BIOPSY Right   . BREAST LUMPECTOMY Right 1980's?   "benign"  . CARDIOVERSION N/A 01/22/2014   Procedure: CARDIOVERSION;  Surgeon: Chrystie Nose, MD;  Location: [facility] ENDOSCOPY;  Service: Cardiovascular;  Laterality: N/A;  . INSERT / REPLACE /  REMOVE PACEMAKER  12/26/2012   Medtronic, Dr. Royann Shivers  . NM MYOCAR PERF WALL MOTION  08/05/2009   small to mod. reversible anteroseptal,septal,& apical perfusion defect. EF 59%.  Marland Kitchen PERMANENT PACEMAKER INSERTION N/A 12/26/2012   Procedure: PERMANENT PACEMAKER INSERTION;  Surgeon: Thurmon Fair, MD;  Location: [facility] CATH LAB;  Service: Cardiovascular;  Laterality: N/A;  . TONSILLECTOMY AND ADENOIDECTOMY  1960's  . TUBAL LIGATION  1971  . VAGINAL HYSTERECTOMY  1980's     OB History   None      Home Medications    Prior to Admission medications   Medication Sig Start Date End Date Taking? Authorizing Provider  acetaminophen (TYLENOL) 325 MG tablet Take 1-2 tablets (325-650 mg total) by mouth every 4 (four) hours as needed for mild pain. Patient taking differently: Take 500 mg by mouth every 4 (four) hours as needed for mild pain.  12/27/12  Yes Kilroy, Luke K, PA-C  azithromycin (ZITHROMAX) 250 MG tablet Take 250 mg by mouth daily.   Yes [provider]  LIVALO 1 MG TABS TAKE 1 TABLET DAILY Patient taking differently: TAKE 1mg  TABLET DAILY 10/24/16  Yes Croitoru, Mihai, MD  ondansetron (ZOFRAN ODT) 4 MG disintegrating tablet Take 1 tablet (4 mg total) by mouth every 8 (eight) hours as needed for nausea or vomiting. 04/18/16  Yes Horton, Mayer Masker, MD  SAVAYSA 30 MG TABS tablet TAKE 1 TABLET DAILY. MAKE  AN APPOINTMENT FOR FURTHER REFILLS Patient taking differently: TAKE 30 mg TABLET DAILY. MAKE  AN APPOINTMENT FOR FURTHER REFILLS 10/24/16  Yes Croitoru, Mihai, MD  sotalol (BETAPACE) 80 MG tablet TAKE 1 TABLET DAILY. MAKE  AN APPOINTMENT FOR FURTHER REFILLS Patient taking differently: TAKE 80 mg TABLET DAILY. MAKE  AN APPOINTMENT FOR FURTHER REFILLS 10/24/16  Yes Croitoru, Mihai, MD  valsartan (DIOVAN) 160 MG tablet Take 160 mg by mouth daily.   Yes [provider]  SAVAYSA 30 MG TABS tablet TAKE 1 TABLET DAILY Patient not taking: Reported on 04/07/2017 07/23/16   Croitoru,  Rachelle Hora, MD    Family History Family History  Problem Relation Age of Onset  . Heart attack Mother   . Stroke Sister     Social History Social History   Tobacco Use  . Smoking status: Never Smoker  . Smokeless tobacco: Never Used  Substance Use Topics  . Alcohol use: No  . Drug use: No     Allergies   Codeine   Review of Systems Review of Systems  Constitutional: Positive for chills and fever. Negative for activity change.  HENT: Positive for congestion and sore throat.   Eyes: Negative for visual disturbance.  Respiratory: Positive for cough, shortness of breath and wheezing.   Cardiovascular: Positive for leg swelling. Negative for chest pain.  Gastrointestinal: Negative for abdominal pain, diarrhea, nausea and vomiting.  Genitourinary: Negative for dysuria.  Musculoskeletal: Positive for myalgias. Negative for back pain.  Skin: Negative for rash.  Allergic/Immunologic: Negative for immunocompromised state.  Neurological: Negative  for dizziness, syncope, weakness and headaches.  Psychiatric/Behavioral: Negative for confusion.     Physical Exam Updated Vital Signs BP 107/76   Pulse (!) 114   Temp 97.9 F (36.6 C) (Oral)   Resp 16   SpO2 100%   Physical Exam  Constitutional: She is oriented to person, place, and time. No distress.  HENT:  Head: Normocephalic.  Right Ear: External ear normal.  Left Ear: External ear normal.  Petechiae present on the hard palate.  Eyes: Conjunctivae are normal.  Neck: Normal range of motion. Neck supple. No JVD present.  Cardiovascular: Normal rate, regular rhythm, normal heart sounds and intact distal pulses. Exam reveals no gallop and no friction rub.  No murmur heard. Pulmonary/Chest: Effort normal. No stridor. No respiratory distress. She has no wheezes. She has no rales. She exhibits no tenderness.  Poor air movement bilaterally.  No adventitious breath sounds auscultated.  Abdominal: Soft. She exhibits no mass. There  is no tenderness. There is no rebound and no guarding. No hernia.  Musculoskeletal: She exhibits edema. She exhibits no tenderness or deformity.  Mild edema of the bilateral lower extremities.   Neurological: She is alert and oriented to person, place, and time.  Skin: Skin is warm. Capillary refill takes less than 2 seconds. No rash noted. She is not diaphoretic.  Psychiatric: Her behavior is normal.  Nursing note and vitals reviewed.  ED Treatments / Results  Labs (all labs ordered are listed, but only abnormal results are displayed) Labs Reviewed  BASIC METABOLIC PANEL - Abnormal; Notable for the following components:      Result Value   CO2 21 (*)    Glucose, Bld 103 (*)    BUN 26 (*)    Creatinine, Ser 1.36 (*)    GFR calc non Af Amer 37 (*)    GFR calc Af Amer 43 (*)    All other components within normal limits  BRAIN NATRIURETIC PEPTIDE - Abnormal; Notable for the following components:   B Natriuretic Peptide 748.3 (*)    All other components within normal limits  CBC WITH DIFFERENTIAL/PLATELET - Abnormal; Notable for the following components:   WBC 14.4 (*)    Neutro Abs 9.9 (*)    Monocytes Absolute 1.5 (*)    All other components within normal limits  RAPID STREP SCREEN (NOT AT [facility])  CULTURE, GROUP A STREP ([facility])  INFLUENZA PANEL BY PCR (TYPE A & B)  MONONUCLEOSIS SCREEN  BASIC METABOLIC PANEL  I-STAT TROPONIN, ED  I-STAT CG4 LACTIC ACID, ED  I-STAT CG4 LACTIC ACID, ED    EKG EKG Interpretation  Date/Time:  Sunday April 07 2017 15:57:26 EDT Ventricular Rate:  140 PR Interval:    QRS Duration: 122 QT Interval:  274 QTC Calculation: 418 R Axis:   60 Text Interpretation:  Atrial fibrillation with rapid ventricular response Non-specific intra-ventricular conduction delay Marked ST abnormality, possible inferior subendocardial injury Abnormal ECG Confirmed by Kristine Royal [phone]) on 04/07/2017 5:31:09 PM   Radiology Dg Chest 2 View  Result Date:  04/07/2017 CLINICAL DATA:  Shortness of breath EXAM: CHEST - 2 VIEW COMPARISON:  04/20/2016 FINDINGS: Bilateral interstitial thickening. No focal consolidation, pleural effusion or pneumothorax. Stable mild cardiomegaly. Dual lead cardiac pacemaker. Thoracic aortic atherosclerosis. Dual lead cardiac pacemaker. IMPRESSION: Cardiomegaly with mild pulmonary vascular congestion. Electronically Signed   By: Elige Ko   On: 04/07/2017 16:33    Procedures Procedures (including critical care time)  Medications Ordered in ED Medications  azithromycin (ZITHROMAX)  tablet 250 mg (has no administration in time range)  pravastatin (PRAVACHOL) tablet 20 mg (has no administration in time range)  edoxaban (SAVAYSA) tablet 30 mg (has no administration in time range)  sotalol (BETAPACE) tablet 80 mg (has no administration in time range)  irbesartan (AVAPRO) tablet 150 mg (has no administration in time range)  sodium chloride flush (NS) 0.9 % injection 3 mL (3 mLs Intravenous Not Given 04/07/17 2050)  sodium chloride flush (NS) 0.9 % injection 3 mL (has no administration in time range)  0.9 %  sodium chloride infusion (has no administration in time range)  acetaminophen (TYLENOL) tablet 650 mg (has no administration in time range)  ondansetron (ZOFRAN) injection 4 mg (has no administration in time range)  furosemide (LASIX) injection 40 mg (has no administration in time range)  diltiazem (CARDIZEM) 100 mg in dextrose 5% (1 mg/mL) infusion (7.5 mg/hr Intravenous Rate/Dose Change 04/07/17 2152)  albuterol (PROVENTIL) (2.5 MG/3ML) 0.083% nebulizer solution 2.5 mg (has no administration in time range)  albuterol (PROVENTIL) (2.5 MG/3ML) 0.083% nebulizer solution 5 mg (5 mg Nebulization Given 04/07/17 1825)  ipratropium (ATROVENT) nebulizer solution 0.5 mg (0.5 mg Nebulization Given 04/07/17 1825)  methylPREDNISolone sodium succinate (SOLU-MEDROL) 125 mg/2 mL injection 125 mg (125 mg Intravenous Given 04/07/17  1832)  furosemide (LASIX) injection 20 mg (20 mg Intravenous Given 04/07/17 1954)  cefTRIAXone (ROCEPHIN) 1 g in sodium chloride 0.9 % 100 mL IVPB (0 g Intravenous Stopped 04/07/17 2054)  doxycycline (VIBRA-TABS) tablet 100 mg (100 mg Oral Given 04/07/17 2019)  albuterol (PROVENTIL) (2.5 MG/3ML) 0.083% nebulizer solution 5 mg (5 mg Nebulization Given 04/07/17 2119)     Initial Impression / Assessment and Plan / ED Course  I have reviewed the triage vital signs and the nursing notes.  Pertinent labs & imaging results that were available during my care of the patient were reviewed by me and considered in my medical decision making (see chart for details).  Clinical Course as of Apr 07 2152  Wynelle Link Apr 07, 2017  2042 Recheck.  Patient's heart rate has been 100-110 since she has been roomed.  While ambulating, heart rate increased to 140s. Seemed to improve when sitting back down. Now, HR is variable from 100s- 140s on the monitor.  She has inspiratory and expiratory wheezes in all fields.  Nebulizer treatment ordered.  5 mg of IV Cardizem ordered with slow push over 5 minutes after speaking with Dr. Rodena Medin.  Patient's oxygen saturation seems to have decreased from 96 to 97% on room air to 93 to 94%.  Will place on nasal cannula after albuterol treatment if oxygen saturation continues to remain at or below 93%.   [MM]    Clinical Course User Index [MM] Gabreal Worton A, PA-C    77 year old female with a h/o of chronic diastolic CHF, SSS s/p pacemaker HTN, HLD, presents to the emergency department with a chief complaint of dyspnea, orthopnea, cough, fever, chills, myalgias, and URI symptoms.  Patient was discussed with Dr. Rodena Medin, attending physician.   Tachycardic at rest and the 100-110s.  Chest x-ray with cardiomegaly with mild pulmonary vascular congestion.  EKG rhythm unchanged from previous, rate is increased.  Troponin is negative.  Leukocytosis of 14 elevated neutrophil and monocyte count.  BNP  748.  On initial examination, poor air movement bilaterally.  After albuterol and ipratropium treatment diffuse inspiratory and expiratory wheezes in all fields.  125 mg of Solu-Medrol given.  Patient was ambulated through the unit and heart rate  increased to the 140s; however, oxygen saturation remained stable at 97% on room air.  Doxycycline and ceftriaxone ordered for questionable CAP.    She also endorsed a sore throat and had palatal petechiae on exam, concerning for mononucleosis versus streptococcal pharyngitis; both negative.  Influenza panel was negative.  Differential diagnosis includes acute on chronic diastolic congestive heart failure with a combination of CAP versus respiratory virus.   Spoke with Dr. Antionette Char with the hospitalist team who will admit.Updated the patient on admission. Heart rate was now fluctuating from her previous resting rate into the 120s and 130s. 5 mg of Cardizem was ordered but d/ced due to hospitalist orders being entered at the same time.   The patient appears reasonably stabilized for admission considering the current resources, flow, and capabilities available in the ED at this time, and I doubt any other [facility] requiring further screening and/or treatment in the ED prior to admission.   Final Clinical Impressions(s) / ED Diagnoses   Final diagnoses:  Acute on chronic diastolic (congestive) heart failure [facility])    ED Discharge Orders    None       Barkley Boards, PA-C 04/07/17 2154    Wynetta Fines, MD 04/08/17 [phone]

## 2017-04-07 NOTE — ED Notes (Signed)
Ambulated Pt in Hallway while on Pulse Ox without O2. Pt started at 97%. Pt stayed between 96%-98% while ambulating. Pt stated her legs were cramping but was not short of breath. Pt ambulated to Bathroom and back to room. Pt at 97% currently back in the bed.

## 2017-04-07 NOTE — ED Triage Notes (Signed)
Pt states 2 weeks of shortness of breath. Pt thought she had a respiratory infection, maybe pneumonia. Pt took Augmentin and is in the middle of a Zpack. Pt has hx of CHF. EKG is abnormal at triage, shown to MD Jacubowitz. Pt does endorse abdominal swelling and states her pants are tight but denies leg edema. Pt has labored respirations.

## 2017-04-08 ENCOUNTER — Encounter (HOSPITAL_COMMUNITY): Payer: Self-pay | Admitting: General Practice

## 2017-04-08 ENCOUNTER — Inpatient Hospital Stay (HOSPITAL_COMMUNITY): Payer: Medicare Other

## 2017-04-08 ENCOUNTER — Other Ambulatory Visit: Payer: Self-pay

## 2017-04-08 DIAGNOSIS — I4891 Unspecified atrial fibrillation: Secondary | ICD-10-CM

## 2017-04-08 DIAGNOSIS — I495 Sick sinus syndrome: Secondary | ICD-10-CM

## 2017-04-08 DIAGNOSIS — I5021 Acute systolic (congestive) heart failure: Secondary | ICD-10-CM

## 2017-04-08 DIAGNOSIS — I351 Nonrheumatic aortic (valve) insufficiency: Secondary | ICD-10-CM

## 2017-04-08 DIAGNOSIS — I10 Essential (primary) hypertension: Secondary | ICD-10-CM

## 2017-04-08 DIAGNOSIS — I340 Nonrheumatic mitral (valve) insufficiency: Secondary | ICD-10-CM

## 2017-04-08 DIAGNOSIS — N183 Chronic kidney disease, stage 3 (moderate): Secondary | ICD-10-CM

## 2017-04-08 DIAGNOSIS — I361 Nonrheumatic tricuspid (valve) insufficiency: Secondary | ICD-10-CM

## 2017-04-08 LAB — BASIC METABOLIC PANEL
Anion gap: 14 (ref 5–15)
BUN: 26 mg/dL — AB (ref 6–20)
CALCIUM: 9.2 mg/dL (ref 8.9–10.3)
CO2: 21 mmol/L — ABNORMAL LOW (ref 22–32)
Chloride: 103 mmol/L (ref 101–111)
Creatinine, Ser: 1.55 mg/dL — ABNORMAL HIGH (ref 0.44–1.00)
GFR calc Af Amer: 37 mL/min — ABNORMAL LOW (ref >60)
GFR calc non Af Amer: 32 mL/min — ABNORMAL LOW (ref >60)
GLUCOSE: 246 mg/dL — AB (ref 65–99)
Potassium: 4 mmol/L (ref 3.5–5.1)
Sodium: 138 mmol/L (ref 135–145)

## 2017-04-08 LAB — ECHOCARDIOGRAM COMPLETE

## 2017-04-08 MED ORDER — ALUM & MAG HYDROXIDE-SIMETH 200-200-20 MG/5ML PO SUSP
30.0000 mL | Freq: Four times a day (QID) | ORAL | Status: DC | PRN
  Administered 2017-04-08 – 2017-04-09 (×2): 30 mL via ORAL
  Filled 2017-04-08 (×2): qty 30

## 2017-04-08 MED ORDER — DIGOXIN 125 MCG PO TABS
0.1250 mg | ORAL_TABLET | Freq: Every day | ORAL | Status: DC
  Administered 2017-04-09 – 2017-04-11 (×3): 0.125 mg via ORAL
  Filled 2017-04-08 (×3): qty 1

## 2017-04-08 MED ORDER — DIGOXIN 250 MCG PO TABS
0.2500 mg | ORAL_TABLET | ORAL | Status: AC
  Administered 2017-04-08 – 2017-04-09 (×3): 0.25 mg via ORAL
  Filled 2017-04-08 (×3): qty 1

## 2017-04-08 MED ORDER — SOTALOL HCL 120 MG PO TABS
120.0000 mg | ORAL_TABLET | Freq: Two times a day (BID) | ORAL | Status: DC
  Administered 2017-04-08 – 2017-04-11 (×6): 120 mg via ORAL
  Filled 2017-04-08 (×6): qty 1

## 2017-04-08 MED ORDER — GUAIFENESIN-DM 100-10 MG/5ML PO SYRP
10.0000 mL | ORAL_SOLUTION | Freq: Four times a day (QID) | ORAL | Status: DC | PRN
  Administered 2017-04-08 (×2): 10 mL via ORAL
  Filled 2017-04-08 (×4): qty 10

## 2017-04-08 NOTE — Consult Note (Signed)
Cardiology Consultation:   Patient ID: Monica Harvey; [id number]; 03/29/1940   Admit date: 04/07/2017 Date of Consult: 04/08/2017  Primary Care Provider: Marva Panda, NP Primary Cardiologist: No primary care provider on file. Yvon Mccord Primary Electrophysiologist:  n/a   Patient Profile:   Monica Harvey is a 77 y.o. female with a hx of paroxysmal atrial fibrillation, SSS and dual chamber pacemaker who is being seen today for the evaluation of persistent atrial fibrillation at the request of Dr. Ella Jubilee.  History of Present Illness:   Monica Harvey presents today with dyspnea related to upper respiratory tract infection.  Her son had the flu a couple of weeks ago.  Monica Harvey has also noticed weight gain and increased abdominal distention, but no significant lower extremity edema.  Monica Harvey denies angina orthopnea or PND.  Monica Harvey has wheezing.  Monica Harvey has not improved despite treatment with azithromycin.  Monica Harvey has not had fever or chills.  Monica Harvey was noted to be tachycardic and is found to be in atrial fibrillation.  Monica Harvey reports compliance with daily anticoagulant therapy.    Monica Harvey reports problems with dyspnea every time Monica Harvey has switched from valsartan 160mg  to candesartan 16 mg (due to recall), feeling better when Monica Harvey switched to some old left-over valsartan.  Interrogation of her pacemaker shows that Monica Harvey is action a protracted episode of persistent atrial fibrillation that began in November 2018.  Rate has been on the average only mildly accelerated. Prior to AFib onset, average HR at night 60/day 80, but once in AFib, Hr was 80s at night and 100-110 during the day. True tachycardia has only been present in the last week or two, HR in the 120s, also with loss of day-night difference. Despite the arrhythmia, activity level has been constant at roughly 2 hours a day over the last 4-5 months.  Atrial fibrillatory waves are relatively low voltage at 0.9 mV,  otherwise pacemaker function is normal with appropriate  lead parameters and battery voltage (Medtronic Adapta implanted 2014).  Monica Harvey has a chronic left bundle branch block.  Her current ECG shows atrial fibrillation rapid ventricular response and the left bundle branch block.  Her chest x-ray shows mild pulmonary vascular congestion.  There is evidence of atherosclerosis in the thoracic aorta" stable mild cardiomegaly".  Her echocardiogram today shows a drastic reduction in left ventricular systolic function, now with EF of 20-25% and dyskinesis of the anteroseptal myocardium (this is likely artifactual due to LBBB), moderate to severe mitral insufficiency, moderate to severe tricuspid insufficiency and biatrial dilatation.  The left atrium measures 43 mm / 36 mL/m sq body surface area.  Monica Harvey has a history of 28-month persistent atrial fibrillation 2016 for which Monica Harvey underwent cardioversion.  After that Monica Harvey had very infrequent episodes of atrial fibrillation until June 2018 when Monica Harvey had a spontaneously terminated 9-day event.  As mentioned Monica Harvey has been in uninterrupted persistent atrial fibrillation since mid November 2018  Past Medical History:  Diagnosis Date  . Anginal pain (HCC)   . Chronic anticoagulation, CHAD2Vasc score 4 02/09/2014  . Chronic diastolic CHF (congestive heart failure) (HCC)    a. 01/2014 Echo: EF 50-55%, Gr1 DD, mild AI/MR.  . First degree atrioventricular block   . GERD (gastroesophageal reflux disease)   . Heart murmur   . HTN (hypertension)   . Hyperlipidemia   . LBBB (left bundle branch block)   . PAF (paroxysmal atrial fibrillation) (HCC)    a. Previously on flecainide;  b. 01/2014 sotalol loaded and dccv performed;  c. Chronic Savaysa Rx.  . Presence of permanent cardiac pacemaker    a. 12/26/12 s/p MDT DC PPM.  . SSS (sick sinus syndrome) (HCC)    a. s/p PPM-Medtronic placed 12/26/12   . Symptomatic sinus bradycardia     Past Surgical History:  Procedure Laterality Date  . APPENDECTOMY  1971  . BREAST EXCISIONAL  BIOPSY Right   . BREAST LUMPECTOMY Right 1980's?   "benign"  . CARDIOVERSION N/A 01/22/2014   Procedure: CARDIOVERSION;  Surgeon: Chrystie Nose, MD;  Location: [facility] ENDOSCOPY;  Service: Cardiovascular;  Laterality: N/A;  . INSERT / REPLACE / REMOVE PACEMAKER  12/26/2012   Medtronic, Dr. Royann Shivers  . NM MYOCAR PERF WALL MOTION  08/05/2009   small to mod. reversible anteroseptal,septal,& apical perfusion defect. EF 59%.  Marland Kitchen PERMANENT PACEMAKER INSERTION N/A 12/26/2012   Procedure: PERMANENT PACEMAKER INSERTION;  Surgeon: Thurmon Fair, MD;  Location: [facility] CATH LAB;  Service: Cardiovascular;  Laterality: N/A;  . TONSILLECTOMY AND ADENOIDECTOMY  1960's  . TUBAL LIGATION  1971  . VAGINAL HYSTERECTOMY  1980's     Home Medications:  Prior to Admission medications   Medication Sig Start Date End Date Taking? Authorizing Provider  acetaminophen (TYLENOL) 325 MG tablet Take 1-2 tablets (325-650 mg total) by mouth every 4 (four) hours as needed for mild pain. Patient taking differently: Take 500 mg by mouth every 4 (four) hours as needed for mild pain.  12/27/12  Yes Kilroy, Luke K, PA-C  azithromycin (ZITHROMAX) 250 MG tablet Take 250 mg by mouth daily.   Yes [provider]  LIVALO 1 MG TABS TAKE 1 TABLET DAILY Patient taking differently: TAKE 1mg  TABLET DAILY 10/24/16  Yes Morene Cecilio, MD  ondansetron (ZOFRAN ODT) 4 MG disintegrating tablet Take 1 tablet (4 mg total) by mouth every 8 (eight) hours as needed for nausea or vomiting. 04/18/16  Yes Horton, Mayer Masker, MD  SAVAYSA 30 MG TABS tablet TAKE 1 TABLET DAILY. MAKE  AN APPOINTMENT FOR FURTHER REFILLS Patient taking differently: TAKE 30 mg TABLET DAILY. MAKE  AN APPOINTMENT FOR FURTHER REFILLS 10/24/16  Yes Timon Geissinger, MD  sotalol (BETAPACE) 80 MG tablet TAKE 1 TABLET DAILY. MAKE  AN APPOINTMENT FOR FURTHER REFILLS Patient taking differently: TAKE 80 mg TABLET DAILY. MAKE  AN APPOINTMENT FOR FURTHER REFILLS 10/24/16  Yes Danh Bayus,  Ioan Landini, MD  valsartan (DIOVAN) 160 MG tablet Take 160 mg by mouth daily.   Yes [provider]  SAVAYSA 30 MG TABS tablet TAKE 1 TABLET DAILY Patient not taking: Reported on 04/07/2017 07/23/16   Mattilynn Forrer, Rachelle Hora, MD    Inpatient Medications: Scheduled Meds: . azithromycin  250 mg Oral Daily  . edoxaban  30 mg Oral Q24H  . furosemide  40 mg Intravenous BID  . irbesartan  150 mg Oral Daily  . pravastatin  20 mg Oral q1800  . sodium chloride flush  3 mL Intravenous Q12H  . sotalol  80 mg Oral Daily   Continuous Infusions: . sodium chloride    . diltiazem (CARDIZEM) infusion 5 mg/hr (04/08/17 1014)   PRN Meds: sodium chloride, acetaminophen, albuterol, guaiFENesin-dextromethorphan, ondansetron (ZOFRAN) IV, sodium chloride flush  Allergies:    Allergies  Allergen Reactions  . Codeine Nausea And Vomiting    Social History:   Social History   Socioeconomic History  . Marital status: Married    Spouse name: Not on file  . Number of children: Not on file  . Years of education: Not on file  . Highest  education level: Not on file  Occupational History  . Not on file  Social Needs  . Financial resource strain: Not on file  . Food insecurity:    Worry: Not on file    Inability: Not on file  . Transportation needs:    Medical: Not on file    Non-medical: Not on file  Tobacco Use  . Smoking status: Never Smoker  . Smokeless tobacco: Never Used  Substance and Sexual Activity  . Alcohol use: No  . Drug use: No  . Sexual activity: Not Currently  Lifestyle  . Physical activity:    Days per week: Not on file    Minutes per session: Not on file  . Stress: Not on file  Relationships  . Social connections:    Talks on phone: Not on file    Gets together: Not on file    Attends religious service: Not on file    Active member of club or organization: Not on file    Attends meetings of clubs or organizations: Not on file    Relationship status: Not on file  . Intimate  partner violence:    Fear of current or ex partner: Not on file    Emotionally abused: Not on file    Physically abused: Not on file    Forced sexual activity: Not on file  Other Topics Concern  . Not on file  Social History Narrative  . Not on file    Family History:    Family History  Problem Relation Age of Onset  . Heart attack Mother   . Stroke Sister      ROS:  Please see the history of present illness.   All other ROS reviewed and negative.     Physical Exam/Data:   Vitals:   04/08/17 1300 04/08/17 1330 04/08/17 1519 04/08/17 1531  BP: 105/65 101/78  120/78  Pulse: 81 85  88  Resp: 20 (!) 22  20  Temp:    99.8 F (37.7 C)  TempSrc:    Oral  SpO2: 96% 96%  98%  Weight:   153 lb 3.5 oz (69.5 kg)   Height:   5\' 2"  (1.575 m)     Intake/Output Summary (Last 24 hours) at 04/08/2017 1636 Last data filed at 04/07/2017 2054 Gross per 24 hour  Intake 100 ml  Output -  Net 100 ml   Filed Weights   04/08/17 1519  Weight: 153 lb 3.5 oz (69.5 kg)   Body mass index is 28.02 kg/m.  General:  Well nourished, well developed, in no acute distress HEENT: normal Lymph: no adenopathy Neck: 7-8 cm elevation JVD Endocrine:  No thryomegaly Vascular: No carotid bruits; FA pulses 2+ bilaterally without bruits  Cardiac:  normal S1, paradoxically split S2; RRR; holosystolic murmurs are heard both lower sternal border and at the apex Lungs:  clear to auscultation bilaterally, no wheezing, rhonchi or rales  Abd: soft, nontender, no hepatomegaly  Ext: no edema Musculoskeletal:  No deformities, BUE and BLE strength normal and equal Skin: warm and dry  Neuro:  CNs 2-12 intact, no focal abnormalities noted Psych:  Normal affect   EKG:  The EKG was personally reviewed and demonstrates: Atrial fibrillation rapid ventricular response and chronic LBBB. QTc 418 ms. Telemetry:  Telemetry was personally reviewed and demonstrates: Atrial fibrillation rapid ventricular response  Relevant  CV Studies: Echo today   - Left ventricle: Wall thickness was increased in a pattern of mild   LVH. Systolic  function was severely reduced. The estimated   ejection fraction was in the range of 20% to 25%. Dyskinesis of   the anteroseptal myocardium. - Aortic valve: There was mild to moderate regurgitation. - Mitral valve: There was moderate to severe regurgitation. - Left atrium: The atrium was moderately dilated. - Right atrium: The atrium was mildly dilated. - Tricuspid valve: There was moderate-severe regurgitation.  Laboratory Data:  Chemistry Recent Labs  Lab 04/07/17 1605 04/08/17 0225  NA 136 138  K 4.8 4.0  CL 106 103  CO2 21* 21*  GLUCOSE 103* 246*  BUN 26* 26*  CREATININE 1.36* 1.55*  CALCIUM 9.5 9.2  GFRNONAA 37* 32*  GFRAA 43* 37*  ANIONGAP 9 14    No results for input(s): PROT, ALBUMIN, AST, ALT, ALKPHOS, BILITOT in the last 168 hours. Hematology Recent Labs  Lab 04/07/17 1605  WBC 14.4*  RBC 4.33  HGB 13.5  HCT 40.9  MCV 94.5  MCH 31.2  MCHC 33.0  RDW 14.5  PLT 248   Cardiac EnzymesNo results for input(s): TROPONINI in the last 168 hours.  Recent Labs  Lab 04/07/17 1616  TROPIPOC 0.00    BNP Recent Labs  Lab 04/07/17 1607  BNP 748.3*    DDimer No results for input(s): DDIMER in the last 168 hours.  Radiology/Studies:  Dg Chest 2 View  Result Date: 04/07/2017 CLINICAL DATA:  Shortness of breath EXAM: CHEST - 2 VIEW COMPARISON:  04/20/2016 FINDINGS: Bilateral interstitial thickening. No focal consolidation, pleural effusion or pneumothorax. Stable mild cardiomegaly. Dual lead cardiac pacemaker. Thoracic aortic atherosclerosis. Dual lead cardiac pacemaker. IMPRESSION: Cardiomegaly with mild pulmonary vascular congestion. Electronically Signed   By: Elige Ko   On: 04/07/2017 16:33    Assessment and Plan:   1. CHF: It is unclear to me whether her shortness of breath is entirely due to heart failure or whether there is a component of  upper respiratory tract infection, but I think the presence of heart failure is undeniable.  Suspect Monica Harvey may have tachycardia related cardiomyopathy, although the degree of tachycardia for most of the last few months was not particularly impressive. Already on ARB - but candesartan "doesn't work". Switch to irbesartan. 2. AFib:   Add digoxin for better rate control. Avoid diltiazem due to low EF. Continue sotalol, since this has worked well for her until recently. Can increase dose to 120 mg BID. QT interval monitoring, acknowledging that her QT interval is longer due to pre-existing left bundle branch block. Monica Harvey reports uninterrupted anticoagulation. If atrial fibrillation persists (highly likely) will plan for cardioversion during this admission, probably on Thursday.  If this does not work, consider referral to EP for radiofrequency ablation. Another alternative would be to switch to Tikosyn, possibly more effective. Would like to avoid amiodarone if possible, although amio would have the advantage of better rate. CHADS-VASc is at least 5 (CHF, HTN age x2, gender). 3. LBBB: option for CRT if EF does not recover after rate/rhythm control. 4. CKD stage 3: stable.   For questions or updates, please contact [facility] Please consult [email or web address] for contact info under Cardiology/STEMI.   Signed, Thurmon Fair, MD  04/08/2017 4:36 PM

## 2017-04-08 NOTE — Telephone Encounter (Signed)
[email or web address]

## 2017-04-08 NOTE — Progress Notes (Signed)
  Echocardiogram 2D Echocardiogram has been performed.  Monica Harvey F 04/08/2017, 2:35 PM

## 2017-04-08 NOTE — ED Notes (Signed)
Dr. Ella Jubilee contacted re cardizem, BP and scheduled lasix. States to hold lasix until he sees. Pt.

## 2017-04-08 NOTE — Progress Notes (Signed)
PROGRESS NOTE    Monica Harvey  BSW:[id number] DOB: [birth year] DOA: 04/07/2017 PCP: Marva Panda, NP    Brief Narrative:  77 year old female who presented with dyspnea. She does have significant past medical history of atrial fibrillation, chronic diastolic heart failure, sick sinus syndrome status post pacemaker. Patient presents with dyspnea for last week and a half, associated with weight gain, increased abdominal girth. She had persistent and worsening symptoms despite outpatient antibiotic therapy with azithromycin. Positive PND and orthopnea. On initial physical examination blood pressure 101/84, heart rate 121, respiratory rate 19, oxygen saturation 94%. Moist mucous membranes, lungs with rales at bases bilaterally, positive tachypnea, heart rate 120 bpm, irregularly irregular, S1-S2 present, abdomen protuberant, 1+ bilateral edema at the lower extremities. Sodium 136, potassium 4.8, chloride 106, bicarbonate 21, glucose 13, BUN 26, creatinine 1.36, BNP 748, white count 14.4, hemoglobin 13.5, hematocrit 40.9, platelets 248. Chest x-ray with increased vascular congestion bilaterally. EKG atrial fibrillation with left bundle branch block, 140 bpm, poor with progression.  Patient was admitted to the hospital working diagnosis of atrial fibrillation with rapid ventricular response complicated by cardiogenic pulmonary edema, acute decompensation of chronic diastolic heart failure.   Assessment & Plan:   Principal Problem:   Acute on chronic diastolic CHF (congestive heart failure) (HCC) Active Problems:   HTN (hypertension)   Atrial fibrillation with RVR (HCC)   Chronic renal insufficiency, stage III (moderate) (HCC)   SSS (sick sinus syndrome) (HCC)   1. Acute on chronic diastolic heart failure exacerbation. Will continue diuresis to target negative fluid balance, continue heart failure management with irbesartan.   2. Atrial fibrillation with rapid ventricular response. Will  continue rate control with diltiazem, continue anticoagulation, patient had cardioversion in the past. Continue sotalol.   3. Cardiogenic pulmonary edema, present on admission. Continue diuresis to target negative fluid balance, continue oxymetry and supplemental -02 per [place].   4. AKI. Worsening renal function with serum cr at 1,55 with K at 4,0 and serum bicarbonate at 21, will continue diuresis with furosemide and follow on renal panel in am.   DVT prophylaxis: full anticoagulation   Code Status:  full Family Communication: I spoke with patient's family at the bedside and all questions were addressed.  Disposition Plan: home when stable   Consultants:   Cardiology   Procedures:     Antimicrobials:       Subjective: Patient feeling better after rate control and diuresis, no nausea or vomiting, still symptomatic and not back to her baseline.   Objective: Vitals:   04/08/17 1300 04/08/17 1330 04/08/17 1519 04/08/17 1531  BP: 105/65 101/78  120/78  Pulse: 81 85  88  Resp: 20 (!) 22  20  Temp:    99.8 F (37.7 C)  TempSrc:    Oral  SpO2: 96% 96%  98%  Weight:   69.5 kg (153 lb 3.5 oz)   Height:   5\' 2"  (1.575 m)     Intake/Output Summary (Last 24 hours) at 04/08/2017 1627 Last data filed at 04/07/2017 2054 Gross per 24 hour  Intake 100 ml  Output -  Net 100 ml   Filed Weights   04/08/17 1519  Weight: 69.5 kg (153 lb 3.5 oz)    Examination:   General: Not in pain or dyspnea, deconditioned Neurology: Awake and alert, non focal  E ENT: mild pallor, no icterus, oral mucosa moist Cardiovascular: No JVD. S1-S2 present, rhythmic, no gallops, rubs, or murmurs. Positive non pitting lower extremity edema. Pulmonary: decreased breath  sounds bilaterally, adequate air movement, no wheezing, rhonchi or rales. Gastrointestinal. Abdomen protiberant, no organomegaly, non tender, no rebound or guarding Skin. No rashes Musculoskeletal: no joint deformities     Data  Reviewed: I have personally reviewed following labs and imaging studies  CBC: Recent Labs  Lab 04/07/17 1605  WBC 14.4*  NEUTROABS 9.9*  HGB 13.5  HCT 40.9  MCV 94.5  PLT 248   Basic Metabolic Panel: Recent Labs  Lab 04/07/17 1605 04/08/17 0225  NA 136 138  K 4.8 4.0  CL 106 103  CO2 21* 21*  GLUCOSE 103* 246*  BUN 26* 26*  CREATININE 1.36* 1.55*  CALCIUM 9.5 9.2   GFR: Estimated Creatinine Clearance: 28.7 mL/min (A) (by C-G formula based on SCr of 1.55 mg/dL (H)). Liver Function Tests: No results for input(s): AST, ALT, ALKPHOS, BILITOT, PROT, ALBUMIN in the last 168 hours. No results for input(s): LIPASE, AMYLASE in the last 168 hours. No results for input(s): AMMONIA in the last 168 hours. Coagulation Profile: No results for input(s): INR, PROTIME in the last 168 hours. Cardiac Enzymes: No results for input(s): CKTOTAL, CKMB, CKMBINDEX, TROPONINI in the last 168 hours. BNP (last 3 results) No results for input(s): PROBNP in the last 8760 hours. HbA1C: No results for input(s): HGBA1C in the last 72 hours. CBG: No results for input(s): GLUCAP in the last 168 hours. Lipid Profile: No results for input(s): CHOL, HDL, LDLCALC, TRIG, CHOLHDL, LDLDIRECT in the last 72 hours. Thyroid Function Tests: No results for input(s): TSH, T4TOTAL, FREET4, T3FREE, THYROIDAB in the last 72 hours. Anemia Panel: No results for input(s): VITAMINB12, FOLATE, FERRITIN, TIBC, IRON, RETICCTPCT in the last 72 hours.    Radiology Studies: I have reviewed all of the imaging during this hospital visit personally     Scheduled Meds: . azithromycin  250 mg Oral Daily  . edoxaban  30 mg Oral Q24H  . furosemide  40 mg Intravenous BID  . irbesartan  150 mg Oral Daily  . pravastatin  20 mg Oral q1800  . sodium chloride flush  3 mL Intravenous Q12H  . sotalol  80 mg Oral Daily   Continuous Infusions: . sodium chloride    . diltiazem (CARDIZEM) infusion 5 mg/hr (04/08/17 1014)      LOS: 1 day        Fryda Molenda Annett Gula, MD [facility] Pager [phone]

## 2017-04-08 NOTE — ED Notes (Signed)
SWOT RN called to transport pt

## 2017-04-08 NOTE — ED Notes (Signed)
Pt ambulatory to restroom with steady gait, appears to be more labored after ambulation, sats 98% on room air

## 2017-04-08 NOTE — ED Notes (Signed)
Attempted report x1. 

## 2017-04-08 NOTE — Progress Notes (Addendum)
PROGRESS NOTE    Monica Harvey  ZOX:[id number] DOB: 1940/11/05 DOA: 04/07/2017 PCP: Marva Panda, NP  [facility]   Brief Narrative:  77 yo female who presented to ED with complaint of dyspnea for last 2 weeks. History of atrial fibrillation, chronic diastolic/preserved ejection fraction CHF, and sick sinus syndrome with pacemaker placed five years ago. Patient was seen twice outpatient in last 2 weeks and was started on azithromycin. Denies fever, chills, cough, chest pain. Patient reports that previous abdominal edema present on admission has returned to normal. WBC 14.4, Glucose 246, BUN 26, Cr 1.55, ALT 75, GFR 37, BNP 748.3. EKG showed Afib with RVR, rate at 140bpm, non-specific IVCD.  Chest X-ray showed cardiomegaly with mild pulmonary vascular congestion.   Patient was admitted with working diagnosis of acute on chronic diastolic CHF.   Assessment & Plan:   Principal Problem:   Acute on chronic diastolic CHF (congestive heart failure) (HCC) Active Problems:   HTN (hypertension)   Atrial fibrillation with RVR (HCC)   Chronic renal insufficiency, stage III (moderate) (HCC)   SSS (sick sinus syndrome) (HCC)  1. Atrial fibrillation with RVR:  - Current HR within normal limits at rest - Completed diltiazem infusion with titration - continue sotalol, continue Sayvasa  - repeat EKG   2. Acute on chronic diastolic CHF  - Vascular congestion on CXR, and BNP 750 much higher than priors  - Last echo was 2016, demonstrating preserved EF, grade 1 diastolic dysfunction, and mild MR and AR.  - Treated in ED with IV Lasix  - Continue diuresis with Lasix 40 mg IV q12h, continue ARB, SLIV, follow daily wt and I/O's - Echo ordered, waiting for results.    3. CKD stage III  - GFR 32 - Renally-dose medications, follow daily chem panel during diuresis     DVT prophylaxis: continue endoxaban Code Status: Full Family Communication: Discussed with patient and husband.    Disposition Plan: Home when clinically improved   Consultants:   Procedures:   Echocardiogram  Antimicrobials:    Subjective: Patient is calm, alert, and oriented. Reports that abdomen no longer feels edematous.  Objective: Vitals:   04/08/17 1045 04/08/17 1100 04/08/17 1115 04/08/17 1130  BP:  102/64  108/66  Pulse: 82 89 84 97  Resp: 18 (!) 22 (!) 23 18  Temp:      TempSrc:      SpO2: 96% 96% 96% 96%    Intake/Output Summary (Last 24 hours) at 04/08/2017 1245 Last data filed at 04/07/2017 2054 Gross per 24 hour  Intake 100 ml  Output -  Net 100 ml   There were no vitals filed for this visit.  Examination:  General exam: Appears calm and comfortable  Respiratory system: Respiratory effort normal. Bilateral rales at bases.  Cardiovascular system: Rate 92, irregular. 1+ peripheral edema bilaterally.  Gastrointestinal system: Abdomen is nondistended, soft and nontender. Normal bowel sounds heard. Central nervous system: Alert and oriented. No focal neurological deficits. Extremities: Symmetric 5 x 5 power. Skin: No rashes, lesions or ulcers Psychiatry: Judgement and insight appear normal. Mood & affect appropriate.   Data Reviewed: I have personally reviewed following labs and imaging studies  CBC: Recent Labs  Lab 04/07/17 1605  WBC 14.4*  NEUTROABS 9.9*  HGB 13.5  HCT 40.9  MCV 94.5  PLT 248   Basic Metabolic Panel: Recent Labs  Lab 04/07/17 1605 04/08/17 0225  NA 136 138  K 4.8 4.0  CL 106 103  CO2 21*  21*  GLUCOSE 103* 246*  BUN 26* 26*  CREATININE 1.36* 1.55*  CALCIUM 9.5 9.2   GFR: CrCl cannot be calculated (Unknown ideal weight.). Liver Function Tests: No results for input(s): AST, ALT, ALKPHOS, BILITOT, PROT, ALBUMIN in the last 168 hours. No results for input(s): LIPASE, AMYLASE in the last 168 hours. No results for input(s): AMMONIA in the last 168 hours. Coagulation Profile: No results for input(s): INR, PROTIME in the last 168  hours. Cardiac Enzymes: No results for input(s): CKTOTAL, CKMB, CKMBINDEX, TROPONINI in the last 168 hours. BNP (last 3 results) No results for input(s): PROBNP in the last 8760 hours. HbA1C: No results for input(s): HGBA1C in the last 72 hours. CBG: No results for input(s): GLUCAP in the last 168 hours. Lipid Profile: No results for input(s): CHOL, HDL, LDLCALC, TRIG, CHOLHDL, LDLDIRECT in the last 72 hours. Thyroid Function Tests: No results for input(s): TSH, T4TOTAL, FREET4, T3FREE, THYROIDAB in the last 72 hours. Anemia Panel: No results for input(s): VITAMINB12, FOLATE, FERRITIN, TIBC, IRON, RETICCTPCT in the last 72 hours. Urine analysis:    Component Value Date/Time   COLORURINE YELLOW 04/18/2016 0105   APPEARANCEUR CLEAR 04/18/2016 0105   LABSPEC 1.021 04/18/2016 0105   PHURINE 6.0 04/18/2016 0105   GLUCOSEU NEGATIVE 04/18/2016 0105   HGBUR NEGATIVE 04/18/2016 0105   BILIRUBINUR NEGATIVE 04/18/2016 0105   KETONESUR NEGATIVE 04/18/2016 0105   PROTEINUR NEGATIVE 04/18/2016 0105   NITRITE NEGATIVE 04/18/2016 0105   LEUKOCYTESUR TRACE (A) 04/18/2016 0105   Sepsis Labs: @LABRCNTIP (procalcitonin:4,lacticidven:4)  ) Recent Results (from the past 240 hour(s))  Rapid strep screen     Status: None   Collection Time: 04/07/17  5:57 PM  Result Value Ref Range Status   Streptococcus, Group A Screen (Direct) NEGATIVE NEGATIVE Final    Comment: (NOTE) A Rapid Antigen test may result negative if the antigen level in the sample is below the detection level of this test. The FDA has not cleared this test as a stand-alone test therefore the rapid antigen negative result has reflexed to a Group A Strep culture. Performed at [facility], [address], [place], [place] [address]          Radiology Studies: Dg Chest 2 View  Result Date: 04/07/2017 CLINICAL DATA:  Shortness of breath EXAM: CHEST - 2 VIEW COMPARISON:  04/20/2016 FINDINGS: Bilateral interstitial  thickening. No focal consolidation, pleural effusion or pneumothorax. Stable mild cardiomegaly. Dual lead cardiac pacemaker. Thoracic aortic atherosclerosis. Dual lead cardiac pacemaker. IMPRESSION: Cardiomegaly with mild pulmonary vascular congestion. Electronically Signed   By: Elige Ko   On: 04/07/2017 16:33    Scheduled Meds: . azithromycin  250 mg Oral Daily  . edoxaban  30 mg Oral Q24H  . furosemide  40 mg Intravenous BID  . irbesartan  150 mg Oral Daily  . pravastatin  20 mg Oral q1800  . sodium chloride flush  3 mL Intravenous Q12H  . sotalol  80 mg Oral Daily   Continuous Infusions: . sodium chloride    . diltiazem (CARDIZEM) infusion 5 mg/hr (04/08/17 1014)     LOS: 1 day         [facility] Pager [phone]  If 7PM-7AM, please contact night-coverage [email or web address] Password [email or web address] 04/08/2017, 12:45 PM

## 2017-04-08 NOTE — ED Notes (Signed)
Pt 96% on RA 

## 2017-04-08 NOTE — ED Notes (Signed)
Admitting by the bedside 

## 2017-04-08 NOTE — ED Notes (Signed)
Pt has concerns about taking irbesartan.  Will contact MD to advise.

## 2017-04-08 NOTE — ED Notes (Signed)
Pt sitting at bedsdie and eating breakfast. Tolerating well.

## 2017-04-09 LAB — BASIC METABOLIC PANEL
Anion gap: 13 (ref 5–15)
BUN: 34 mg/dL — AB (ref 6–20)
CHLORIDE: 102 mmol/L (ref 101–111)
CO2: 24 mmol/L (ref 22–32)
CREATININE: 1.52 mg/dL — AB (ref 0.44–1.00)
Calcium: 9.3 mg/dL (ref 8.9–10.3)
GFR calc Af Amer: 38 mL/min — ABNORMAL LOW (ref >60)
GFR calc non Af Amer: 32 mL/min — ABNORMAL LOW (ref >60)
Glucose, Bld: 138 mg/dL — ABNORMAL HIGH (ref 65–99)
Potassium: 4.5 mmol/L (ref 3.5–5.1)
Sodium: 139 mmol/L (ref 135–145)

## 2017-04-09 MED ORDER — IPRATROPIUM-ALBUTEROL 0.5-2.5 (3) MG/3ML IN SOLN
3.0000 mL | Freq: Four times a day (QID) | RESPIRATORY_TRACT | Status: DC
  Administered 2017-04-10 (×3): 3 mL via RESPIRATORY_TRACT
  Filled 2017-04-09 (×4): qty 3

## 2017-04-09 NOTE — Telephone Encounter (Signed)
Patient is currently admitted

## 2017-04-09 NOTE — Progress Notes (Signed)
PROGRESS NOTE    Monica Harvey  ZOX:[id number] DOB: Jun 17, 1940 DOA: 04/07/2017 PCP: Marva Panda, NP    Brief Narrative:  77 year old female who presented with dyspnea. She does have significant past medical history of atrial fibrillation, chronic diastolic heart failure, sick sinus syndrome status post pacemaker. Patient presents with dyspnea for last week and a half, associated with weight gain, increased abdominal girth. She had persistent and worsening symptoms despite outpatient antibiotic therapy with azithromycin. Positive PND and orthopnea. On initial physical examination blood pressure 101/84, heart rate 121, respiratory rate 19, oxygen saturation 94%. Moist mucous membranes, lungs with rales at bases bilaterally, positive tachypnea, heart rate 120 bpm, irregularly irregular, S1-S2 present, abdomen protuberant, 1+ bilateral edema at the lower extremities. Sodium 136, potassium 4.8, chloride 106, bicarbonate 21, glucose 13, BUN 26, creatinine 1.36, BNP 748, white count 14.4, hemoglobin 13.5, hematocrit 40.9, platelets 248. Chest x-ray with increased vascular congestion bilaterally. EKG atrial fibrillation with left bundle branch block, 140 bpm, poor with progression.  Patient was admitted to the hospital working diagnosis of atrial fibrillation with rapid ventricular response complicated by cardiogenic pulmonary edema, acute decompensation of chronic diastolic heart failure.    Assessment & Plan:   Principal Problem:   Acute on chronic diastolic CHF (congestive heart failure) (HCC) Active Problems:   HTN (hypertension)   Atrial fibrillation with RVR (HCC)   Chronic renal insufficiency, stage III (moderate) (HCC)   SSS (sick sinus syndrome) (HCC)   1. Acute on chronic systolic heart failure exacerbation. Echocardiogram with LV systolic function down to 20to 25%, urine output over last 24 hours not recorded, but symptoms improved, continue diuresis with furosemide, to keep  negative fluid balance, strict in and out. Patient has been placed on digoxin and sotalol. Continue irbesartan.  2. Atrial fibrillation with rapid ventricular response. Rate controlled, patient still on atrial fibrillation rhythm, will continue sotalol and anticoagulation with endoxoban. Plan for electrical cardioversion. Ekg personally reviewed noted atrial fibrillation with left bundle branch block. Case discussed with cardiology Dr. Royann Shivers.   3. Cardiogenic pulmonary edema, present on admission. Oxymetry monitoring, bronchodilator therapy, patient with no smoking history will follow chest film in am. Oxymetry at 95% to 99% on room air.   4. AKI.  Stable renal function with serum cr at 1,52, serum K at 4,5 and serum bicarbonate at 24. Will follow on renal panel in am.   DVT prophylaxis: full anticoagulation   Code Status:  full Family Communication: No family at the bedside   Disposition Plan: home when stable   Consultants:   Cardiology   Procedures:     Antimicrobials:       Subjective: Patient is feeling better, dyspnea has improved, but not back to baseline, positive cough, decreased abdominal wall edema, no nausea or vomiting,   Objective: Vitals:   04/09/17 0514 04/09/17 0757 04/09/17 0858 04/09/17 1309  BP: (!) 134/94 119/89  117/72  Pulse: 80 (!) 118 95 65  Resp:      Temp: (!) 97.5 F (36.4 C) 97.7 F (36.5 C)  98.4 F (36.9 C)  TempSrc: Oral Oral  Oral  SpO2: 94% 95%  95%  Weight: 67.3 kg (148 lb 6.4 oz)     Height:        Intake/Output Summary (Last 24 hours) at 04/09/2017 1402 Last data filed at 04/09/2017 1310 Gross per 24 hour  Intake 859.5 ml  Output -  Net 859.5 ml   Filed Weights   04/08/17 1519 04/09/17 0514  Weight: 69.5 kg (153 lb 3.5 oz) 67.3 kg (148 lb 6.4 oz)    Examination:   General: deconditioned Neurology: Awake and alert, non focal  E ENT: mild pallor, no icterus, oral mucosa moist Cardiovascular: No JVD. S1-S2  present, rhythmic, no gallops, rubs, or murmurs. No lower extremity edema. Pulmonary: decreased breath sounds bilaterally, decreases air movement, intermittent wheezing, with no rhonchi but bilateral rales. Gastrointestinal. Abdomen with no organomegaly, non tender, no rebound or guarding Skin. No rashes Musculoskeletal: no joint deformities     Data Reviewed: I have personally reviewed following labs and imaging studies  CBC: Recent Labs  Lab 04/07/17 1605  WBC 14.4*  NEUTROABS 9.9*  HGB 13.5  HCT 40.9  MCV 94.5  PLT 248   Basic Metabolic Panel: Recent Labs  Lab 04/07/17 1605 04/08/17 0225 04/09/17 0404  NA 136 138 139  K 4.8 4.0 4.5  CL 106 103 102  CO2 21* 21* 24  GLUCOSE 103* 246* 138*  BUN 26* 26* 34*  CREATININE 1.36* 1.55* 1.52*  CALCIUM 9.5 9.2 9.3   GFR: Estimated Creatinine Clearance: 28.8 mL/min (A) (by C-G formula based on SCr of 1.52 mg/dL (H)). Liver Function Tests: No results for input(s): AST, ALT, ALKPHOS, BILITOT, PROT, ALBUMIN in the last 168 hours. No results for input(s): LIPASE, AMYLASE in the last 168 hours. No results for input(s): AMMONIA in the last 168 hours. Coagulation Profile: No results for input(s): INR, PROTIME in the last 168 hours. Cardiac Enzymes: No results for input(s): CKTOTAL, CKMB, CKMBINDEX, TROPONINI in the last 168 hours. BNP (last 3 results) No results for input(s): PROBNP in the last 8760 hours. HbA1C: No results for input(s): HGBA1C in the last 72 hours. CBG: No results for input(s): GLUCAP in the last 168 hours. Lipid Profile: No results for input(s): CHOL, HDL, LDLCALC, TRIG, CHOLHDL, LDLDIRECT in the last 72 hours. Thyroid Function Tests: No results for input(s): TSH, T4TOTAL, FREET4, T3FREE, THYROIDAB in the last 72 hours. Anemia Panel: No results for input(s): VITAMINB12, FOLATE, FERRITIN, TIBC, IRON, RETICCTPCT in the last 72 hours.    Radiology Studies: I have reviewed all of the imaging during this  hospital visit personally     Scheduled Meds: . digoxin  0.125 mg Oral Daily  . edoxaban  30 mg Oral Q24H  . furosemide  40 mg Intravenous BID  . irbesartan  150 mg Oral Daily  . pravastatin  20 mg Oral q1800  . sodium chloride flush  3 mL Intravenous Q12H  . sotalol  120 mg Oral Q12H   Continuous Infusions: . sodium chloride    . diltiazem (CARDIZEM) infusion Stopped (04/08/17 2157)     LOS: 2 days        Jerson Furukawa Annett Gula, MD [facility] Pager [phone]

## 2017-04-09 NOTE — Discharge Instructions (Addendum)
Weigh daily ist thing in AM after using bathroom, make a log of weights  If wt climbs more than 3 lbs in a day or 5 lbs in a week call the office of Cr. Croitoru  Very low salt diet.     Information on my medicine - Savaysa (edoxaban)  Why was SaVAYSA prescribed for you? Larkin Ina was prescribed for you to reduce the risk of a blood clot forming that can cause a stroke if you have a medical condition called atrial fibrillation (a type of irregular heartbeat).  What do You need to know about SAVAYSA ? Take your Savaysa ONCE DAILY - one tablet daily with or without food. If you have difficulty swallowing the tablet whole please discuss with your pharmacist how to take the medication safely.  Take Savaysa exactly as prescribed by your doctor and DO NOT stop taking Savaysa without talking to the doctor who prescribed the medication.  Stopping may increase your risk of developing a stroke.  Refill your prescription before you run out.  After discharge, you should have regular check-up appointments with your healthcare provider that is prescribing your Savaysa.  In the future your dose may need to be changed if your kidney function or weight changes by a significant amount or as you get older.  What do you do if you miss a dose? If you miss a dose, take it as soon as you remember on the same day and resume taking twice daily.  Do not take more than one dose of SAVAYSA at the same time to make up a missed dose.  Important Safety Information A possible side effect of Savaysa is bleeding. You should call your healthcare provider right away if you experience any of the following: ? Bleeding from an injury or your nose that does not stop. ? Unusual colored urine (red or dark brown) or unusual colored stools (red or black). ? Unusual bruising for unknown reasons. ? A serious fall or if you hit your head (even if there is no bleeding).  Some medicines may interact with Savaysa and might  increase your risk of bleeding or clotting while on Savaysa. To help avoid this, consult your healthcare provider or pharmacist prior to using any new prescription or non-prescription medications, including herbals, vitamins, non-steroidal anti-inflammatory drugs (NSAIDs) and supplements.

## 2017-04-09 NOTE — Progress Notes (Signed)
Progress Note  Patient Name: Monica Harvey Date of Encounter: 04/09/2017  Primary Cardiologist: No primary care provider on file.   Subjective   Had orthopnea starting at about 3 a.m.  Has a lot of wheezing.  Only slight improvement since yesterday. Discussed echo findings which show EF has dropped to 20-25%. Rate control remains poor even after loading with digoxin.  Ventricular rate was 90-100s overnight, now 110-120s at rest. No net diuresis per in/out but strongly doubt they were fully recorded.  5 pound weight drop since yesterday (but I think admission weight was an estimate not a true recording). Throughout most of 2018 her weight was 141-143 pounds, today 5-7 pounds above that level.  Inpatient Medications    Scheduled Meds: . digoxin  0.125 mg Oral Daily  . edoxaban  30 mg Oral Q24H  . furosemide  40 mg Intravenous BID  . irbesartan  150 mg Oral Daily  . pravastatin  20 mg Oral q1800  . sodium chloride flush  3 mL Intravenous Q12H  . sotalol  120 mg Oral Q12H   Continuous Infusions: . sodium chloride    . diltiazem (CARDIZEM) infusion Stopped (04/08/17 2157)   PRN Meds: sodium chloride, acetaminophen, albuterol, alum & mag hydroxide-simeth, guaiFENesin-dextromethorphan, ondansetron (ZOFRAN) IV, sodium chloride flush   Vital Signs    Vitals:   04/09/17 0020 04/09/17 0514 04/09/17 0757 04/09/17 0858  BP:  (!) 134/94 119/89   Pulse: (!) 101 80 (!) 118 95  Resp:      Temp:  (!) 97.5 F (36.4 C) 97.7 F (36.5 C)   TempSrc:  Oral Oral   SpO2:  94% 95%   Weight:  148 lb 6.4 oz (67.3 kg)    Height:        Intake/Output Summary (Last 24 hours) at 04/09/2017 0926 Last data filed at 04/08/2017 2157 Gross per 24 hour  Intake 379.5 ml  Output -  Net 379.5 ml   Filed Weights   04/08/17 1519 04/09/17 0514  Weight: 153 lb 3.5 oz (69.5 kg) 148 lb 6.4 oz (67.3 kg)    Telemetry    Atrial fibrillation with rapid ventricular response- Personally Reviewed  ECG      Atrial fibrillation rapid ventricular response, QTC 490 ms- Personally Reviewed  Physical Exam  Comfortable and sitting up GEN: No acute distress.   Neck: No JVD Cardiac:  Irregular, holosystolic murmurs apical and right lower sternal border, no diastolic murmurs, rubs, or gallops.  Respiratory:  Bilateral wheezing GI: Soft, nontender, non-distended  MS: No edema; No deformity. Neuro:  Nonfocal  Psych: Normal affect   Labs    Chemistry Recent Labs  Lab 04/07/17 1605 04/08/17 0225 04/09/17 0404  NA 136 138 139  K 4.8 4.0 4.5  CL 106 103 102  CO2 21* 21* 24  GLUCOSE 103* 246* 138*  BUN 26* 26* 34*  CREATININE 1.36* 1.55* 1.52*  CALCIUM 9.5 9.2 9.3  GFRNONAA 37* 32* 32*  GFRAA 43* 37* 38*  ANIONGAP 9 14 13      Hematology Recent Labs  Lab 04/07/17 1605  WBC 14.4*  RBC 4.33  HGB 13.5  HCT 40.9  MCV 94.5  MCH 31.2  MCHC 33.0  RDW 14.5  PLT 248    Cardiac EnzymesNo results for input(s): TROPONINI in the last 168 hours.  Recent Labs  Lab 04/07/17 1616  TROPIPOC 0.00     BNP Recent Labs  Lab 04/07/17 1607  BNP 748.3*     DDimer  No results for input(s): DDIMER in the last 168 hours.   Radiology    Dg Chest 2 View  Result Date: 04/07/2017 CLINICAL DATA:  Shortness of breath EXAM: CHEST - 2 VIEW COMPARISON:  04/20/2016 FINDINGS: Bilateral interstitial thickening. No focal consolidation, pleural effusion or pneumothorax. Stable mild cardiomegaly. Dual lead cardiac pacemaker. Thoracic aortic atherosclerosis. Dual lead cardiac pacemaker. IMPRESSION: Cardiomegaly with mild pulmonary vascular congestion. Electronically Signed   By: Elige Ko   On: 04/07/2017 16:33    Cardiac Studies   ECHO 04/08/2017 - Left ventricle: Wall thickness was increased in a pattern of mild LVH. Systolic function was severely reduced. The estimated ejection fraction was in the range of 20% to 25%. Dyskinesis of the anteroseptal myocardium. - Aortic valve: There was  mild to moderate regurgitation. - Mitral valve: There was moderate to severe regurgitation. - Left atrium: The atrium was moderately dilated. - Right atrium: The atrium was mildly dilated. - Tricuspid valve: There was moderate-severe regurgitation.  Patient Profile     77 y.o. female long-standing sick sinus syndrome with dual-chamber permanent pacemaker, history of previous persistent atrial fibrillation on chronic treatment with sotalol, now admitted with dyspnea related to respiratory infection as well as acute systolic heart failure with newly decreased LVEF to 20-25%, in the setting of persistent atrial fibrillation since last November.  Additional problems include chronic left bundle branch block and CKD stage III.  Assessment & Plan    1. CHF: Newly dropped EF to 20-25%.  Differential diagnosis includes tachycardia related cardiomyopathy, acute myocarditis related to respiratory infection and painless ischemic heart disease.  The first possibility appears to be the most likely.  She is still hypervolemic, continue IV diuretics, try to bring her weight down to about 140-143 pounds.  Monitor renal function daily.  She is on an angiotensin receptor blocker and a "beta-blocker" (sotalol).  Her wheezing may represent respiratory infection (her son had the "flu" a week before her), but could also be cardiac asthma.  She has never smoked. 2. AFib: Rate control is elusive.  Sotalol increased last night.  Consider adding digoxin.  Avoid additional beta-blocker due to prominent wheezing.  Avoid diltiazem due to low ejection fraction.  Plan for cardioversion on Thursday after she has received 5 or 6 increased doses of sotalol.  She has been taking her anticoagulant without interruption. 3. LBBB: Option for CRT if LV function does not improve. 4. CKD: Monitor renal function carefully while adjust doses of highly active renally-excreted medications.  For questions or updates, please contact [facility] Please consult [email or web address] for contact info under Cardiology/STEMI.      Signed, Thurmon Fair, MD  04/09/2017, 9:26 AM

## 2017-04-09 NOTE — Plan of Care (Signed)
  Problem: Education: Goal: Understanding of medication regimen will improve Outcome: Progressing Note:  Educated on digoxin loading and restarting on sotolol for A-fib.   Problem: Activity: Goal: Ability to tolerate increased activity will improve Outcome: Progressing Note:  Ambulates to bathroom without difficulty.

## 2017-04-10 ENCOUNTER — Inpatient Hospital Stay (HOSPITAL_COMMUNITY): Payer: Medicare Other | Admitting: Certified Registered"

## 2017-04-10 ENCOUNTER — Encounter (HOSPITAL_COMMUNITY): Payer: Self-pay

## 2017-04-10 ENCOUNTER — Encounter (HOSPITAL_COMMUNITY)
Admission: EM | Disposition: A | Discharge status: Home / Self Care | Payer: Self-pay | Source: Home / Self Care | Attending: Internal Medicine

## 2017-04-10 ENCOUNTER — Inpatient Hospital Stay (HOSPITAL_COMMUNITY): Payer: Medicare Other

## 2017-04-10 HISTORY — PX: CARDIOVERSION: SHX1299

## 2017-04-10 LAB — BASIC METABOLIC PANEL
Anion gap: 15 (ref 5–15)
BUN: 37 mg/dL — ABNORMAL HIGH (ref 6–20)
CALCIUM: 9.3 mg/dL (ref 8.9–10.3)
CO2: 27 mmol/L (ref 22–32)
CREATININE: 1.49 mg/dL — AB (ref 0.44–1.00)
Chloride: 95 mmol/L — ABNORMAL LOW (ref 101–111)
GFR, EST AFRICAN AMERICAN: 38 mL/min — AB (ref >60)
GFR, EST NON AFRICAN AMERICAN: 33 mL/min — AB (ref >60)
GLUCOSE: 101 mg/dL — AB (ref 65–99)
Potassium: 3.8 mmol/L (ref 3.5–5.1)
Sodium: 137 mmol/L (ref 135–145)

## 2017-04-10 LAB — CULTURE, GROUP A STREP (THRC)

## 2017-04-10 SURGERY — CARDIOVERSION
Anesthesia: General

## 2017-04-10 MED ORDER — LIDOCAINE 2% (20 MG/ML) 5 ML SYRINGE
INTRAMUSCULAR | Status: DC | PRN
  Administered 2017-04-10: 60 mg via INTRAVENOUS

## 2017-04-10 MED ORDER — FUROSEMIDE 40 MG PO TABS
40.0000 mg | ORAL_TABLET | Freq: Every day | ORAL | Status: DC
  Administered 2017-04-10 – 2017-04-11 (×2): 40 mg via ORAL
  Filled 2017-04-10 (×2): qty 1

## 2017-04-10 MED ORDER — PROPOFOL 10 MG/ML IV BOLUS
INTRAVENOUS | Status: DC | PRN
  Administered 2017-04-10: 60 mg via INTRAVENOUS

## 2017-04-10 MED ORDER — SODIUM CHLORIDE 0.9 % IV SOLN
INTRAVENOUS | Status: DC
  Administered 2017-04-10: 500 mL via INTRAVENOUS

## 2017-04-10 MED ORDER — POTASSIUM CHLORIDE CRYS ER 20 MEQ PO TBCR
40.0000 meq | EXTENDED_RELEASE_TABLET | Freq: Once | ORAL | Status: AC
  Administered 2017-04-10: 40 meq via ORAL
  Filled 2017-04-10: qty 2

## 2017-04-10 NOTE — Interval H&P Note (Signed)
History and Physical Interval Note:  04/10/2017 11:18 AM  Monica Harvey  has presented today for surgery, with the diagnosis of afib  The various methods of treatment have been discussed with the patient and family. After consideration of risks, benefits and other options for treatment, the patient has consented to  Procedure(s): CARDIOVERSION (N/A) as a surgical intervention .  The patient's history has been reviewed, patient examined, no change in status, stable for surgery.  I have reviewed the patient's chart and labs.  Questions were answered to the patient's satisfaction.     [employer]

## 2017-04-10 NOTE — Plan of Care (Signed)
  Problem: Clinical Measurements: Goal: Will remain free from infection Outcome: Progressing Note:  No s/s of infection. Goal: Respiratory complications will improve Outcome: Progressing Note:  Improving with use of nebulizers.   Problem: Coping: Goal: Level of anxiety will decrease Outcome: Progressing Note:  No s/s of anxiety noted.

## 2017-04-10 NOTE — Anesthesia Preprocedure Evaluation (Signed)
Anesthesia Evaluation  Patient identified by MRN, date of birth, ID band Patient awake    Reviewed: Allergy & Precautions, NPO status , reviewed documented beta blocker date and time   Airway Mallampati: II  TM Distance: >3 FB Neck ROM: Full    Dental  (+) Dental Advisory Given, Teeth Intact   Pulmonary    breath sounds clear to auscultation       Cardiovascular hypertension, Pt. on medications + angina + dysrhythmias Atrial Fibrillation + pacemaker  Rhythm:Irregular  ECHO 2014 EF 45%   Neuro/Psych    GI/Hepatic Neg liver ROS, GERD  Medicated,  Endo/Other    Renal/GU Renal InsufficiencyRenal diseaseGFR 34     Musculoskeletal   Abdominal (+)  Abdomen: soft.    Peds  Hematology negative hematology ROS (+)   Anesthesia Other Findings   Reproductive/Obstetrics                             Anesthesia Physical  Anesthesia Plan  ASA: III  Anesthesia Plan: General   Post-op Pain Management:    Induction: Intravenous  PONV Risk Score and Plan: 3 and Treatment may vary due to age or medical condition  Airway Management Planned: Mask  Additional Equipment:   Intra-op Plan:   Post-operative Plan:   Informed Consent: I have reviewed the patients History and Physical, chart, labs and discussed the procedure including the risks, benefits and alternatives for the proposed anesthesia with the patient or authorized representative who has indicated his/her understanding and acceptance.     Plan Discussed with:   Anesthesia Plan Comments:         Anesthesia Quick Evaluation

## 2017-04-10 NOTE — CV Procedure (Signed)
    Electrical Cardioversion Procedure Note Monica Harvey [id number] 18-Jun-1940  Procedure: Electrical Cardioversion Indications:  Atrial Fibrillation  Time Out: Verified patient identification, verified procedure,medications/allergies/relevent history reviewed, required imaging and test results available.  Performed  Procedure Details  The patient was NPO after midnight. Anesthesia was administered at the beside  by Dr.Miller with 80mg  of propofol.  Cardioversion was performed with synchronized biphasic defibrillation via AP pads with 120 joules.  1 attempt(s) were performed.  The patient converted to normal sinus rhythm. The patient tolerated the procedure well   IMPRESSION:  Successful cardioversion of atrial fibrillation Pacemaker interrogated. Normal.     Monica Harvey 04/10/2017, 11:53 AM

## 2017-04-10 NOTE — H&P (View-Only) (Signed)
Progress Note  Patient Name: Monica Harvey Date of Encounter: 04/10/2017  Primary Cardiologist: No primary care provider on file.   Subjective   Much better, wheezing and cough have subsided.  Little net diuresis, but I doubt that the records are accurate with only 700 mL urine output last 24 hours.  Weight is down 8 pounds since yesterday. Heart rate control is substantially better with average rate in the 70-80 range at night, around 100 during the day. ECG this morning shows atrial fibrillation with controlled rate, left bundle branch block, QTC 497 ms.  Inpatient Medications    Scheduled Meds: . digoxin  0.125 mg Oral Daily  . edoxaban  30 mg Oral Q24H  . furosemide  40 mg Intravenous BID  . ipratropium-albuterol  3 mL Nebulization Q6H  . irbesartan  150 mg Oral Daily  . pravastatin  20 mg Oral q1800  . sodium chloride flush  3 mL Intravenous Q12H  . sotalol  120 mg Oral Q12H   Continuous Infusions: . sodium chloride    . diltiazem (CARDIZEM) infusion Stopped (04/08/17 2157)   PRN Meds: sodium chloride, acetaminophen, albuterol, alum & mag hydroxide-simeth, guaiFENesin-dextromethorphan, ondansetron (ZOFRAN) IV, sodium chloride flush   Vital Signs    Vitals:   04/09/17 2359 04/10/17 0332 04/10/17 0724 04/10/17 0743  BP: 115/87 118/88  123/87  Pulse: 81 82  82  Resp: 18 17    Temp: 98.3 F (36.8 C) 98.6 F (37 C)  97.8 F (36.6 C)  TempSrc: Oral Oral  Oral  SpO2: 100% 99% 98% 92%  Weight:  140 lb 3.2 oz (63.6 kg)    Height:        Intake/Output Summary (Last 24 hours) at 04/10/2017 0924 Last data filed at 04/10/2017 0841 Gross per 24 hour  Intake 720 ml  Output 700 ml  Net 20 ml   Filed Weights   04/08/17 1519 04/09/17 0514 04/10/17 0332  Weight: 153 lb 3.5 oz (69.5 kg) 148 lb 6.4 oz (67.3 kg) 140 lb 3.2 oz (63.6 kg)    Telemetry    Atrial fibrillation with controlled ventricular rate- Personally Reviewed  ECG    Atrial fibrillation, left bundle  branch block, QTC 497 ms- Personally Reviewed  Physical Exam  Relaxed, comfortable GEN: No acute distress.   Neck: No JVD Cardiac:   Irregular, no murmurs, rubs, or gallops.  Respiratory: Clear to auscultation bilaterally. GI: Soft, nontender, non-distended  MS: No edema; No deformity. Neuro:  Nonfocal  Psych: Normal affect   Labs    Chemistry Recent Labs  Lab 04/08/17 0225 04/09/17 0404 04/10/17 0348  NA 138 139 137  K 4.0 4.5 3.8  CL 103 102 95*  CO2 21* 24 27  GLUCOSE 246* 138* 101*  BUN 26* 34* 37*  CREATININE 1.55* 1.52* 1.49*  CALCIUM 9.2 9.3 9.3  GFRNONAA 32* 32* 33*  GFRAA 37* 38* 38*  ANIONGAP 14 13 15      Hematology Recent Labs  Lab 04/07/17 1605  WBC 14.4*  RBC 4.33  HGB 13.5  HCT 40.9  MCV 94.5  MCH 31.2  MCHC 33.0  RDW 14.5  PLT 248    Cardiac EnzymesNo results for input(s): TROPONINI in the last 168 hours.  Recent Labs  Lab 04/07/17 1616  TROPIPOC 0.00     BNP Recent Labs  Lab 04/07/17 1607  BNP 748.3*     DDimer No results for input(s): DDIMER in the last 168 hours.   Radiology  No results found.  Cardiac Studies  04/08/2017 echo - Left ventricle: Wall thickness was increased in a pattern of mild   LVH. Systolic function was severely reduced. The estimated   ejection fraction was in the range of 20% to 25%. Dyskinesis of   the anteroseptal myocardium. - Aortic valve: There was mild to moderate regurgitation. - Mitral valve: There was moderate to severe regurgitation. - Left atrium: The atrium was moderately dilated. - Right atrium: The atrium was mildly dilated. - Tricuspid valve: There was moderate-severe regurgitation.   Patient Profile     77 y.o. female with long-standing sick sinus syndrome with dual-chamber permanent pacemaker, history of previous persistent atrial fibrillation on chronic treatment with sotalol, now admitted with dyspnea related to respiratory infection as well as acute systolic heart failure  with newly decreased LVEF to 20-25%, in the setting of persistent atrial fibrillation since last November.  Additional problems include chronic left bundle branch block and CKD stage III.  Assessment & Plan    1. CHF: Newly dropped EF to 20-25%.  Differential diagnosis includes tachycardia related cardiomyopathy, acute myocarditis related to respiratory infection and painless ischemic heart disease.  The first possibility appears to be the most likely.     Today she appears to be clinically euvolemic and is back down to her estimated dry weight of around 140 pounds. She is on an angiotensin receptor blocker and a "beta-blocker" (sotalol).     Switch to low-dose daily oral diuretic. 2. AFib: Rate control is elusive.  Sotalol increased roughly 36 hours ago and QTC is still in the acceptable range at 497 ms (with underlying left bundle branch block). Scheduled for cardioversion later today.  She has been taking her anticoagulant without interruption. 3. LBBB: Option for CRT if LV function does not improve. 4. CKD:   Despite aggressive diuresis, renal function has actually improved.  Borderline low potassium level, would give an extra dose today.    For questions or updates, please contact [facility] Please consult [email or web address] for contact info under Cardiology/STEMI.      Signed, Thurmon Fair, MD  04/10/2017, 9:24 AM

## 2017-04-10 NOTE — Progress Notes (Signed)
 Progress Note  Patient Name: Monica Harvey Date of Encounter: 04/10/2017  Primary Cardiologist: No primary care provider on file.   Subjective   Much better, wheezing and cough have subsided.  Little net diuresis, but I doubt that the records are accurate with only 700 mL urine output last 24 hours.  Weight is down 8 pounds since yesterday. Heart rate control is substantially better with average rate in the 70-80 range at night, around 100 during the day. ECG this morning shows atrial fibrillation with controlled rate, left bundle branch block, QTC 497 ms.  Inpatient Medications    Scheduled Meds: . digoxin  0.125 mg Oral Daily  . edoxaban  30 mg Oral Q24H  . furosemide  40 mg Intravenous BID  . ipratropium-albuterol  3 mL Nebulization Q6H  . irbesartan  150 mg Oral Daily  . pravastatin  20 mg Oral q1800  . sodium chloride flush  3 mL Intravenous Q12H  . sotalol  120 mg Oral Q12H   Continuous Infusions: . sodium chloride    . diltiazem (CARDIZEM) infusion Stopped (04/08/17 2157)   PRN Meds: sodium chloride, acetaminophen, albuterol, alum & mag hydroxide-simeth, guaiFENesin-dextromethorphan, ondansetron (ZOFRAN) IV, sodium chloride flush   Vital Signs    Vitals:   04/09/17 2359 04/10/17 0332 04/10/17 0724 04/10/17 0743  BP: 115/87 118/88  123/87  Pulse: 81 82  82  Resp: 18 17    Temp: 98.3 F (36.8 C) 98.6 F (37 C)  97.8 F (36.6 C)  TempSrc: Oral Oral  Oral  SpO2: 100% 99% 98% 92%  Weight:  140 lb 3.2 oz (63.6 kg)    Height:        Intake/Output Summary (Last 24 hours) at 04/10/2017 0924 Last data filed at 04/10/2017 0841 Gross per 24 hour  Intake 720 ml  Output 700 ml  Net 20 ml   Filed Weights   04/08/17 1519 04/09/17 0514 04/10/17 0332  Weight: 153 lb 3.5 oz (69.5 kg) 148 lb 6.4 oz (67.3 kg) 140 lb 3.2 oz (63.6 kg)    Telemetry    Atrial fibrillation with controlled ventricular rate- Personally Reviewed  ECG    Atrial fibrillation, left bundle  branch block, QTC 497 ms- Personally Reviewed  Physical Exam  Relaxed, comfortable GEN: No acute distress.   Neck: No JVD Cardiac:   Irregular, no murmurs, rubs, or gallops.  Respiratory: Clear to auscultation bilaterally. GI: Soft, nontender, non-distended  MS: No edema; No deformity. Neuro:  Nonfocal  Psych: Normal affect   Labs    Chemistry Recent Labs  Lab 04/08/17 0225 04/09/17 0404 04/10/17 0348  NA 138 139 137  K 4.0 4.5 3.8  CL 103 102 95*  CO2 21* 24 27  GLUCOSE 246* 138* 101*  BUN 26* 34* 37*  CREATININE 1.55* 1.52* 1.49*  CALCIUM 9.2 9.3 9.3  GFRNONAA 32* 32* 33*  GFRAA 37* 38* 38*  ANIONGAP 14 13 15     Hematology Recent Labs  Lab 04/07/17 1605  WBC 14.4*  RBC 4.33  HGB 13.5  HCT 40.9  MCV 94.5  MCH 31.2  MCHC 33.0  RDW 14.5  PLT 248    Cardiac EnzymesNo results for input(s): TROPONINI in the last 168 hours.  Recent Labs  Lab 04/07/17 1616  TROPIPOC 0.00     BNP Recent Labs  Lab 04/07/17 1607  BNP 748.3*     DDimer No results for input(s): DDIMER in the last 168 hours.   Radiology      No results found.  Cardiac Studies  04/08/2017 echo - Left ventricle: Wall thickness was increased in a pattern of mild   LVH. Systolic function was severely reduced. The estimated   ejection fraction was in the range of 20% to 25%. Dyskinesis of   the anteroseptal myocardium. - Aortic valve: There was mild to moderate regurgitation. - Mitral valve: There was moderate to severe regurgitation. - Left atrium: The atrium was moderately dilated. - Right atrium: The atrium was mildly dilated. - Tricuspid valve: There was moderate-severe regurgitation.   Patient Profile     76 y.o. female with long-standing sick sinus syndrome with dual-chamber permanent pacemaker, history of previous persistent atrial fibrillation on chronic treatment with sotalol, now admitted with dyspnea related to respiratory infection as well as acute systolic heart failure  with newly decreased LVEF to 20-25%, in the setting of persistent atrial fibrillation since last November.  Additional problems include chronic left bundle branch block and CKD stage III.  Assessment & Plan    1. CHF: Newly dropped EF to 20-25%.  Differential diagnosis includes tachycardia related cardiomyopathy, acute myocarditis related to respiratory infection and painless ischemic heart disease.  The first possibility appears to be the most likely.     Today she appears to be clinically euvolemic and is back down to her estimated dry weight of around 140 pounds. She is on an angiotensin receptor blocker and a "beta-blocker" (sotalol).     Switch to low-dose daily oral diuretic. 2. AFib: Rate control is elusive.  Sotalol increased roughly 36 hours ago and QTC is still in the acceptable range at 497 ms (with underlying left bundle branch block). Scheduled for cardioversion later today.  She has been taking her anticoagulant without interruption. 3. LBBB: Option for CRT if LV function does not improve. 4. CKD:   Despite aggressive diuresis, renal function has actually improved.  Borderline low potassium level, would give an extra dose today.    For questions or updates, please contact [facility] Please consult [email or web address] for contact info under Cardiology/STEMI.      Signed, Chaya Dehaan, MD  04/10/2017, 9:24 AM    

## 2017-04-10 NOTE — Transfer of Care (Signed)
Immediate Anesthesia Transfer of Care Note  Patient: Monica Harvey  Procedure(s) Performed: CARDIOVERSION (N/A )  Patient Location: Endoscopy Unit  Anesthesia Type:General  Level of Consciousness: drowsy and patient cooperative  Airway & Oxygen Therapy: Patient Spontanous Breathing  Post-op Assessment: Report given to RN  Post vital signs: Reviewed and stable  Last Vitals:  Vitals Value Taken Time  BP    Temp    Pulse 115 04/10/2017 11:57 AM  Resp 22 04/10/2017 11:57 AM  SpO2 96 % 04/10/2017 11:57 AM  Vitals shown include unvalidated device data.  Last Pain:  Vitals:   04/10/17 1110  TempSrc: Oral  PainSc: 0-No pain      Patients Stated Pain Goal: 0 (04/09/17 2031)  Complications: No apparent anesthesia complications

## 2017-04-10 NOTE — Progress Notes (Signed)
PROGRESS NOTE  Monica Harvey RVI:[id number] DOB: 09/13/1940 DOA: 04/07/2017 PCP: Marva Panda, NP  HPI/Recap of past 24 hours:  Monica Harvey is a 77 y.o. year old female with medical history significant for sick sinus syndrome status post pacemaker, persistent atrial fibrillation on sotalol who presented on 04/07/2017 with progressive dyspnea, weight gain ( abdominal girth)and was found to have acute systolic heart failure exacerbation (previously diastolic) related to persistent atrial fibrillation    Interval History No complaints this morning. Reports improvement in abdominal size. No cough, improvement in SOB. No lower leg swelling  Assessment/Plan: Principal Problem:   Acute on chronic diastolic CHF (congestive heart failure) (HCC) Active Problems:   HTN (hypertension)   Atrial fibrillation with RVR (HCC)   Chronic renal insufficiency, stage III (moderate) (HCC)   SSS (sick sinus syndrome) (HCC)   #Acute systolic heart failure, new-patient's EF previously preserved.  Now reduced to 20-25% in setting of persistent atrial fibrillation.  Expect improvement now status post cardioversion.  Improvement in volume status on oral diuretics, chest x-ray also shows improvement in mild interstitial edema.  On irbesartan, sotalol, digoxin, and Lasix (40 mg daily)  #Persistent atrial fibrillation.  Status post cardioversion 4/3 to improve rate control.  Continue sotalol at increased dose per cardiology recommendations.  #Cardiogenic pulmonary edema related to systolic heart failure exacerbation.  Repeat chest x-ray shows improvement in mild edema.  We will continue with oral diuresis mentioned above.  #CKD, stage III.  Creatinine remained stable 0.4-1.5.  Monitor closely avoid nephrotoxins.   Code Status: Full code  Family Communication: No family at bedside  Disposition Plan: We will monitor the next 24 hours after cardioversion with plan to DC on  4/4   Consultants:  Cardiology  Procedures:  Cardioversion on 4/3  Antimicrobials:  None  Cultures: None   Telemetry: Yes  DVT prophylaxis: And doxepin   Objective: Vitals:   04/09/17 2359 04/10/17 0332 04/10/17 0724 04/10/17 0743  BP: 115/87 118/88  123/87  Pulse: 81 82  82  Resp: 18 17    Temp: 98.3 F (36.8 C) 98.6 F (37 C)  97.8 F (36.6 C)  TempSrc: Oral Oral  Oral  SpO2: 100% 99% 98% 92%  Weight:  63.6 kg (140 lb 3.2 oz)    Height:        Intake/Output Summary (Last 24 hours) at 04/10/2017 0838 Last data filed at 04/09/2017 2200 Gross per 24 hour  Intake 720 ml  Output 700 ml  Net 20 ml   Filed Weights   04/08/17 1519 04/09/17 0514 04/10/17 0332  Weight: 69.5 kg (153 lb 3.5 oz) 67.3 kg (148 lb 6.4 oz) 63.6 kg (140 lb 3.2 oz)    Exam:  Constitutional:normal appearing female Eyes: EOMI, anicteric, normal conjunctivae ENMT: Oropharynx with moist mucous membranes, normal dentition Cardiovascular: irregularly irregular rhythm, no MRGs, with no peripheral edema Respiratory: Normal respiratory effort on 2 L [place], clear breath sounds  Abdomen: Soft,non-tender, with no HSM Skin: No rash ulcers, or lesions. Without skin tenting  Neurologic: Grossly no focal neuro deficit. Psychiatric:Appropriate affect, and mood. Mental status AAOx3  Data Reviewed: CBC: Recent Labs  Lab 04/07/17 1605  WBC 14.4*  NEUTROABS 9.9*  HGB 13.5  HCT 40.9  MCV 94.5  PLT 248   Basic Metabolic Panel: Recent Labs  Lab 04/07/17 1605 04/08/17 0225 04/09/17 0404 04/10/17 0348  NA 136 138 139 137  K 4.8 4.0 4.5 3.8  CL 106 103 102 95*  CO2 21*  21* 24 27  GLUCOSE 103* 246* 138* 101*  BUN 26* 26* 34* 37*  CREATININE 1.36* 1.55* 1.52* 1.49*  CALCIUM 9.5 9.2 9.3 9.3   GFR: Estimated Creatinine Clearance: 28.6 mL/min (A) (by C-G formula based on SCr of 1.49 mg/dL (H)). Liver Function Tests: No results for input(s): AST, ALT, ALKPHOS, BILITOT, PROT, ALBUMIN in the last  168 hours. No results for input(s): LIPASE, AMYLASE in the last 168 hours. No results for input(s): AMMONIA in the last 168 hours. Coagulation Profile: No results for input(s): INR, PROTIME in the last 168 hours. Cardiac Enzymes: No results for input(s): CKTOTAL, CKMB, CKMBINDEX, TROPONINI in the last 168 hours. BNP (last 3 results) No results for input(s): PROBNP in the last 8760 hours. HbA1C: No results for input(s): HGBA1C in the last 72 hours. CBG: No results for input(s): GLUCAP in the last 168 hours. Lipid Profile: No results for input(s): CHOL, HDL, LDLCALC, TRIG, CHOLHDL, LDLDIRECT in the last 72 hours. Thyroid Function Tests: No results for input(s): TSH, T4TOTAL, FREET4, T3FREE, THYROIDAB in the last 72 hours. Anemia Panel: No results for input(s): VITAMINB12, FOLATE, FERRITIN, TIBC, IRON, RETICCTPCT in the last 72 hours. Urine analysis:    Component Value Date/Time   COLORURINE YELLOW 04/18/2016 0105   APPEARANCEUR CLEAR 04/18/2016 0105   LABSPEC 1.021 04/18/2016 0105   PHURINE 6.0 04/18/2016 0105   GLUCOSEU NEGATIVE 04/18/2016 0105   HGBUR NEGATIVE 04/18/2016 0105   BILIRUBINUR NEGATIVE 04/18/2016 0105   KETONESUR NEGATIVE 04/18/2016 0105   PROTEINUR NEGATIVE 04/18/2016 0105   NITRITE NEGATIVE 04/18/2016 0105   LEUKOCYTESUR TRACE (A) 04/18/2016 0105   Sepsis Labs: @LABRCNTIP (procalcitonin:4,lacticidven:4)  ) Recent Results (from the past 240 hour(s))  Rapid strep screen     Status: None   Collection Time: 04/07/17  5:57 PM  Result Value Ref Range Status   Streptococcus, Group A Screen (Direct) NEGATIVE NEGATIVE Final    Comment: (NOTE) A Rapid Antigen test may result negative if the antigen level in the sample is below the detection level of this test. The FDA has not cleared this test as a stand-alone test therefore the rapid antigen negative result has reflexed to a Group A Strep culture. Performed at [facility], [address], [place],  [place] [address]   Culture, group A strep     Status: None (Preliminary result)   Collection Time: 04/07/17  5:57 PM  Result Value Ref Range Status   Specimen Description THROAT  Final   Special Requests NONE Reflexed from [id number]  Final   Culture   Final    CULTURE REINCUBATED FOR BETTER GROWTH Performed at [facility], [address] [place], [place] [address]    Report Status PENDING  Incomplete      Studies: No results found.  Scheduled Meds: . digoxin  0.125 mg Oral Daily  . edoxaban  30 mg Oral Q24H  . furosemide  40 mg Intravenous BID  . ipratropium-albuterol  3 mL Nebulization Q6H  . irbesartan  150 mg Oral Daily  . pravastatin  20 mg Oral q1800  . sodium chloride flush  3 mL Intravenous Q12H  . sotalol  120 mg Oral Q12H    Continuous Infusions: . sodium chloride    . diltiazem (CARDIZEM) infusion Stopped (04/08/17 2157)     LOS: 3 days     Monica Peace, MD [facility] Pager [phone]  If 7PM-7AM, please contact night-coverage [email or web address] Password [facility] 04/10/2017, 8:38 AM

## 2017-04-10 NOTE — Anesthesia Postprocedure Evaluation (Signed)
Anesthesia Post Note  Patient: Monica Harvey  Procedure(s) Performed: CARDIOVERSION (N/A )     Patient location during evaluation: PACU Anesthesia Type: General Level of consciousness: awake and alert Pain management: pain level controlled Vital Signs Assessment: post-procedure vital signs reviewed and stable Respiratory status: spontaneous breathing, nonlabored ventilation and respiratory function stable Cardiovascular status: blood pressure returned to baseline and stable Postop Assessment: no apparent nausea or vomiting Anesthetic complications: no    Last Vitals:  Vitals:   04/10/17 1200 04/10/17 1210  BP: 98/61 99/61  Pulse: 60 63  Resp: 20 17  Temp:    SpO2: 90% 93%    Last Pain:  Vitals:   04/10/17 1210  TempSrc:   PainSc: 0-No pain                 Lowella Curb

## 2017-04-11 ENCOUNTER — Telehealth: Payer: Self-pay | Admitting: Cardiovascular Disease

## 2017-04-11 ENCOUNTER — Encounter (HOSPITAL_COMMUNITY): Payer: Self-pay | Admitting: Cardiology

## 2017-04-11 LAB — BASIC METABOLIC PANEL
ANION GAP: 8 (ref 5–15)
BUN: 40 mg/dL — ABNORMAL HIGH (ref 6–20)
CHLORIDE: 104 mmol/L (ref 101–111)
CO2: 25 mmol/L (ref 22–32)
Calcium: 9.2 mg/dL (ref 8.9–10.3)
Creatinine, Ser: 1.3 mg/dL — ABNORMAL HIGH (ref 0.44–1.00)
GFR calc Af Amer: 45 mL/min — ABNORMAL LOW (ref >60)
GFR calc non Af Amer: 39 mL/min — ABNORMAL LOW (ref >60)
GLUCOSE: 101 mg/dL — AB (ref 65–99)
POTASSIUM: 4.6 mmol/L (ref 3.5–5.1)
Sodium: 137 mmol/L (ref 135–145)

## 2017-04-11 MED ORDER — IRBESARTAN 150 MG PO TABS: 150.0000 mg | ORAL_TABLET | Freq: Every day | ORAL | 0 refills | Status: DC

## 2017-04-11 MED ORDER — SOTALOL HCL 120 MG PO TABS: 120.0000 mg | ORAL_TABLET | Freq: Two times a day (BID) | ORAL | 0 refills | Status: DC

## 2017-04-11 MED ORDER — IPRATROPIUM-ALBUTEROL 0.5-2.5 (3) MG/3ML IN SOLN
3.0000 mL | Freq: Three times a day (TID) | RESPIRATORY_TRACT | Status: DC
  Administered 2017-04-11: 3 mL via RESPIRATORY_TRACT
  Filled 2017-04-11: qty 3

## 2017-04-11 MED ORDER — DIGOXIN 125 MCG PO TABS: 0.1250 mg | ORAL_TABLET | Freq: Every day | ORAL | 0 refills | Status: DC

## 2017-04-11 MED ORDER — FUROSEMIDE 40 MG PO TABS: 40.0000 mg | ORAL_TABLET | Freq: Every day | ORAL | 0 refills | Status: DC

## 2017-04-11 NOTE — Discharge Summary (Signed)
Discharge Summary  Monica Harvey ZOX:[id number] DOB: 06-08-1940  PCP: Marva Panda, NP  Admit date: 04/07/2017 Discharge date: 04/11/2017  Time spent: < 25 minutes  Admitted From: Home Disposition:  Home  Recommendations for Outpatient Follow-up:  1. Follow up with PCP in 1-2 weeks 2. Transition of care appointment in 1-2 weeks, repeat TTE in 2 months per cardiology recommendations  Home Health:No Equipment/Devices:none   Discharge Diagnoses:  Active Hospital Problems   Diagnosis Date Noted  . Acute on chronic diastolic CHF (congestive heart failure) (HCC) 01/21/2014  . SSS (sick sinus syndrome) (HCC) 09/03/2015  . Chronic renal insufficiency, stage III (moderate) (HCC) 01/24/2014  . Atrial fibrillation with RVR (HCC)   . HTN (hypertension) 12/23/2012    Resolved Hospital Problems  No resolved problems to display.    Discharge Condition: Stable  CODE STATUS:Full Diet recommendation: Heart Healthy   Vitals:   04/11/17 0500 04/11/17 0828  BP: 137/73   Pulse: 60 64  Resp: 15 16  Temp: 98.3 F (36.8 C)   SpO2: 95% 96%    History of present illness:  Monica Harvey is a 77 y.o. year old female with medical history significant for sick sinus syndrome status post pacemaker, persistent atrial fibrillation on sotalol who presented on 04/07/2017 with progressive dyspnea, weight gain ( abdominal girth)and was found to have acute systolic heart failure exacerbation (previously diastolic) related to persistent atrial fibrillation  Hospital Course:  Principal Problem:   Acute on chronic diastolic CHF (congestive heart failure) (HCC) Active Problems:   HTN (hypertension)   Atrial fibrillation with RVR (HCC)   Chronic renal insufficiency, stage III (moderate) (HCC)   SSS (sick sinus syndrome) (HCC)   #Acute systolic heart failure exacerbation - Elevated BNP, pulmonary vascular congestion on chest x-ray, increased abdominal distention and dyspnea related to CHF.   New drop in EF to 20-25% presumed related to tachycardia mediated cardiomyopathy.  Diuresed with IV Lasix 40 mg twice daily with transition to oral Lasix 40 mg daily, repeat chest x-ray showed improvement in pulmonary edema, on discharge improved abdominal distention and shortness of breath.  Switched from previous home dose of valsartan to irbesartan.  Dry weight 141 pounds on discharge (148 pounds on admission).  Continued on beta-blocker sotalol for rate controlfor A. Fib.  Cardiology will bring back for repeat TTE in 2 months as outpatient  #Atrial fibrillation Patient was continued on her anticoagulation, she underwent cardioversion on 4/3.  Cardiology increase her sotalol to 120 mg twice daily patient's QTC on EKG remained within normal limits.  Digoxin was also added for better rate control.  No diltiazem was used due to low EF.  Patient was continued on her home anticoagulation regimen on discharge    Consultations:  Cardiology   Procedures/Studies: TTE on 04/08/17: EF 20-25%, mild LVH, dyskinesis of anteroseptal myocardium  Dg Chest 2 View  Result Date: 04/10/2017 CLINICAL DATA:  eval for continued dyspnea today - pt is scheduled for a cardioversion today - hx of CHF, htn, PAF, LBBB, GERD, pacemaker EXAM: CHEST - 2 VIEW COMPARISON:  04/07/2017 FINDINGS: Improvement in the mild interstitial edema seen previously. Some residual linear scarring or subsegmental atelectasis in the left mid lung and at the left lung base. Heart size and mediastinal contours are within normal limits. Aortic Atherosclerosis (ICD10-170.0) No effusion. Stable left subclavian dual lead transvenous pacemaker. Visualized bones unremarkable. IMPRESSION: Interval improvement in mild interstitial edema.  No acute findings. Electronically Signed   By: Ronald Pippins.D.  On: 04/10/2017 09:52   Dg Chest 2 View  Result Date: 04/07/2017 CLINICAL DATA:  Shortness of breath EXAM: CHEST - 2 VIEW COMPARISON:  04/20/2016 FINDINGS:  Bilateral interstitial thickening. No focal consolidation, pleural effusion or pneumothorax. Stable mild cardiomegaly. Dual lead cardiac pacemaker. Thoracic aortic atherosclerosis. Dual lead cardiac pacemaker. IMPRESSION: Cardiomegaly with mild pulmonary vascular congestion. Electronically Signed   By: Elige Ko   On: 04/07/2017 16:33     Discharge Exam: BP 137/73 (BP Location: Right Arm)   Pulse 64   Temp 98.3 F (36.8 C) (Oral)   Resp 16   Ht 5\' 2"  (1.575 m)   Wt 64 kg (141 lb 3.2 oz)   SpO2 96%   BMI 25.83 kg/m   General: Lying in bed, no apparent distress Eyes: EOMI, anicteric ENT: Oral Mucosa clear and moist Neck: normal, no thyromegaly Cardiovascular: regular rate and rhythm, no murmurs, rubs or gallops, Peripheral Pulses Present, no edema, no JVD Respiratory: Normal respiratory effort, lungs clear to auscultation bilaterally Abdomen: soft, non-distended, non-tender, normal bowel sounds Skin: No Rash Musculoskeletal:Good ROM, no contractures. Normal muscle tone Neurologic: Grossly no focal neuro deficit.Mental status AAOx3, speech normal, Psychiatric:Appropriate affect, and mood   Discharge Instructions You were cared for by a hospitalist during your hospital stay. If you have any questions about your discharge medications or the care you received while you were in the hospital after you are discharged, you can call the unit and asked to speak with the hospitalist on call if the hospitalist that took care of you is not available. Once you are discharged, your primary care physician will handle any further medical issues. Please note that NO REFILLS for any discharge medications will be authorized once you are discharged, as it is imperative that you return to your primary care physician (or establish a relationship with a primary care physician if you do not have one) for your aftercare needs so that they can reassess your need for medications and monitor your lab  values.  Discharge Instructions    Diet - low sodium heart healthy   Complete by:  As directed    Increase activity slowly   Complete by:  As directed      Allergies as of 04/11/2017      Reactions   Codeine Nausea And Vomiting      Medication List    STOP taking these medications   azithromycin 250 MG tablet Commonly known as:  ZITHROMAX   valsartan 160 MG tablet Commonly known as:  DIOVAN Replaced by:  irbesartan 150 MG tablet     TAKE these medications   acetaminophen 325 MG tablet Commonly known as:  TYLENOL Take 1-2 tablets (325-650 mg total) by mouth every 4 (four) hours as needed for mild pain. What changed:  how much to take   digoxin 0.125 MG tablet Commonly known as:  LANOXIN Take 1 tablet (0.125 mg total) by mouth daily.   furosemide 40 MG tablet Commonly known as:  LASIX Take 1 tablet (40 mg total) by mouth daily.   irbesartan 150 MG tablet Commonly known as:  AVAPRO Take 1 tablet (150 mg total) by mouth daily. Replaces:  valsartan 160 MG tablet   LIVALO 1 MG Tabs Generic drug:  Pitavastatin Calcium TAKE 1 TABLET DAILY What changed:    how much to take  how to take this  when to take this   ondansetron 4 MG disintegrating tablet Commonly known as:  ZOFRAN ODT Take 1 tablet (4  mg total) by mouth every 8 (eight) hours as needed for nausea or vomiting.   SAVAYSA 30 MG Tabs tablet Generic drug:  edoxaban TAKE 1 TABLET DAILY. MAKE  AN APPOINTMENT FOR FURTHER REFILLS What changed:    See the new instructions.  Another medication with the same name was removed. Continue taking this medication, and follow the directions you see here.   sotalol 120 MG tablet Commonly known as:  BETAPACE Take 1 tablet (120 mg total) by mouth every 12 (twelve) hours. What changed:    medication strength  See the new instructions.      Allergies  Allergen Reactions  . Codeine Nausea And Vomiting   Follow-up Information    Croitoru, Mihai, MD Follow up  on 04/18/2017.   Specialty:  Cardiology Why:  at 11:30 AM with his PA Rhonda Barrett.   Contact information: [address] [place] [address] [phone]            The results of significant diagnostics from this hospitalization (including imaging, microbiology, ancillary and laboratory) are listed below for reference.    Significant Diagnostic Studies: Dg Chest 2 View  Result Date: 04/10/2017 CLINICAL DATA:  eval for continued dyspnea today - pt is scheduled for a cardioversion today - hx of CHF, htn, PAF, LBBB, GERD, pacemaker EXAM: CHEST - 2 VIEW COMPARISON:  04/07/2017 FINDINGS: Improvement in the mild interstitial edema seen previously. Some residual linear scarring or subsegmental atelectasis in the left mid lung and at the left lung base. Heart size and mediastinal contours are within normal limits. Aortic Atherosclerosis (ICD10-170.0) No effusion. Stable left subclavian dual lead transvenous pacemaker. Visualized bones unremarkable. IMPRESSION: Interval improvement in mild interstitial edema.  No acute findings. Electronically Signed   By: Corlis Leak M.D.   On: 04/10/2017 09:52   Dg Chest 2 View  Result Date: 04/07/2017 CLINICAL DATA:  Shortness of breath EXAM: CHEST - 2 VIEW COMPARISON:  04/20/2016 FINDINGS: Bilateral interstitial thickening. No focal consolidation, pleural effusion or pneumothorax. Stable mild cardiomegaly. Dual lead cardiac pacemaker. Thoracic aortic atherosclerosis. Dual lead cardiac pacemaker. IMPRESSION: Cardiomegaly with mild pulmonary vascular congestion. Electronically Signed   By: Elige Ko   On: 04/07/2017 16:33    Microbiology: Recent Results (from the past 240 hour(s))  Rapid strep screen     Status: None   Collection Time: 04/07/17  5:57 PM  Result Value Ref Range Status   Streptococcus, Group A Screen (Direct) NEGATIVE NEGATIVE Final    Comment: (NOTE) A Rapid Antigen test may result negative if the antigen level in  the sample is below the detection level of this test. The FDA has not cleared this test as a stand-alone test therefore the rapid antigen negative result has reflexed to a Group A Strep culture. Performed at [facility], [address] [place], [place] [address]   Culture, group A strep     Status: None   Collection Time: 04/07/17  5:57 PM  Result Value Ref Range Status   Specimen Description THROAT  Final   Special Requests NONE Reflexed from [id number]  Final   Culture   Final    NO GROUP A STREP (S.PYOGENES) ISOLATED Performed at [facility], [address] [place], [place] [address]    Report Status 04/10/2017 FINAL  Final     Labs: Basic Metabolic Panel: Recent Labs  Lab 04/07/17 1605 04/08/17 0225 04/09/17 0404 04/10/17 0348 04/11/17 0416  NA 136 138 139 137 137  K 4.8 4.0 4.5 3.8 4.6  CL 106 103 102 95* 104  CO2 21* 21* 24 27 25   GLUCOSE 103* 246* 138* 101* 101*  BUN 26* 26* 34* 37* 40*  CREATININE 1.36* 1.55* 1.52* 1.49* 1.30*  CALCIUM 9.5 9.2 9.3 9.3 9.2   Liver Function Tests: No results for input(s): AST, ALT, ALKPHOS, BILITOT, PROT, ALBUMIN in the last 168 hours. No results for input(s): LIPASE, AMYLASE in the last 168 hours. No results for input(s): AMMONIA in the last 168 hours. CBC: Recent Labs  Lab 04/07/17 1605  WBC 14.4*  NEUTROABS 9.9*  HGB 13.5  HCT 40.9  MCV 94.5  PLT 248   Cardiac Enzymes: No results for input(s): CKTOTAL, CKMB, CKMBINDEX, TROPONINI in the last 168 hours. BNP: BNP (last 3 results) Recent Labs    04/07/17 1607  BNP 748.3*    ProBNP (last 3 results) No results for input(s): PROBNP in the last 8760 hours.  CBG: No results for input(s): GLUCAP in the last 168 hours.     Signed:  Laverna Peace, MD [facility] 04/11/2017, 12:19 PM

## 2017-04-11 NOTE — Telephone Encounter (Signed)
New Message    [facility] 4/11 1130a Theodore Demark

## 2017-04-11 NOTE — Progress Notes (Signed)
Cards follow up appt in discharge.

## 2017-04-11 NOTE — Progress Notes (Addendum)
Progress Note  Patient Name: Monica Harvey Date of Encounter: 04/11/2017  Primary Cardiologist: No primary care provider on file.   Subjective   Feels much better. Maintaining normal (A paced) rhythm  Inpatient Medications    Scheduled Meds: . digoxin  0.125 mg Oral Daily  . edoxaban  30 mg Oral Q24H  . furosemide  40 mg Oral Daily  . ipratropium-albuterol  3 mL Nebulization TID  . irbesartan  150 mg Oral Daily  . pravastatin  20 mg Oral q1800  . sodium chloride flush  3 mL Intravenous Q12H  . sotalol  120 mg Oral Q12H   Continuous Infusions: . sodium chloride Stopped (04/10/17 2030)   PRN Meds: sodium chloride, acetaminophen, albuterol, alum & mag hydroxide-simeth, guaiFENesin-dextromethorphan, ondansetron (ZOFRAN) IV, sodium chloride flush   Vital Signs    Vitals:   04/10/17 2119 04/11/17 0005 04/11/17 0500 04/11/17 0828  BP: (!) 114/57 123/62 137/73   Pulse: 60 60 60 64  Resp: 16 14 15 16   Temp: 98.6 F (37 C) 98.1 F (36.7 C) 98.3 F (36.8 C)   TempSrc: Axillary Axillary Oral   SpO2: 92% 95% 95% 96%  Weight:   141 lb 3.2 oz (64 kg)   Height:        Intake/Output Summary (Last 24 hours) at 04/11/2017 1020 Last data filed at 04/11/2017 0901 Gross per 24 hour  Intake 1060 ml  Output 0 ml  Net 1060 ml   Filed Weights   04/10/17 0332 04/10/17 1110 04/11/17 0500  Weight: 140 lb 3.2 oz (63.6 kg) 140 lb (63.5 kg) 141 lb 3.2 oz (64 kg)    Telemetry    Mostly Apaced Vsensed - Personally Reviewed  ECG    Pending today - Personally Reviewed  Physical Exam  Appears well GEN: No acute distress.   Neck: No JVD Cardiac: RRR, split S2, no murmurs, rubs, or gallops.  Respiratory: Clear to auscultation bilaterally. GI: Soft, nontender, non-distended  MS: No edema; No deformity. Neuro:  Nonfocal  Psych: Normal affect   Labs    Chemistry Recent Labs  Lab 04/09/17 0404 04/10/17 0348 04/11/17 0416  NA 139 137 137  K 4.5 3.8 4.6  CL 102 95* 104  CO2  24 27 25   GLUCOSE 138* 101* 101*  BUN 34* 37* 40*  CREATININE 1.52* 1.49* 1.30*  CALCIUM 9.3 9.3 9.2  GFRNONAA 32* 33* 39*  GFRAA 38* 38* 45*  ANIONGAP 13 15 8      Hematology Recent Labs  Lab 04/07/17 1605  WBC 14.4*  RBC 4.33  HGB 13.5  HCT 40.9  MCV 94.5  MCH 31.2  MCHC 33.0  RDW 14.5  PLT 248    Cardiac EnzymesNo results for input(s): TROPONINI in the last 168 hours.  Recent Labs  Lab 04/07/17 1616  TROPIPOC 0.00     BNP Recent Labs  Lab 04/07/17 1607  BNP 748.3*     DDimer No results for input(s): DDIMER in the last 168 hours.   Radiology    Dg Chest 2 View  Result Date: 04/10/2017 CLINICAL DATA:  eval for continued dyspnea today - pt is scheduled for a cardioversion today - hx of CHF, htn, PAF, LBBB, GERD, pacemaker EXAM: CHEST - 2 VIEW COMPARISON:  04/07/2017 FINDINGS: Improvement in the mild interstitial edema seen previously. Some residual linear scarring or subsegmental atelectasis in the left mid lung and at the left lung base. Heart size and mediastinal contours are within normal limits. Aortic Atherosclerosis (  ICD10-170.0) No effusion. Stable left subclavian dual lead transvenous pacemaker. Visualized bones unremarkable. IMPRESSION: Interval improvement in mild interstitial edema.  No acute findings. Electronically Signed   By: Corlis Leak M.D.   On: 04/10/2017 09:52    Cardiac Studies   04/08/2017 echo - Left ventricle: Wall thickness was increased in a pattern of mild LVH. Systolic function was severely reduced. The estimated ejection fraction was in the range of 20% to 25%. Dyskinesis of the anteroseptal myocardium. - Aortic valve: There was mild to moderate regurgitation. - Mitral valve: There was moderate to severe regurgitation. - Left atrium: The atrium was moderately dilated. - Right atrium: The atrium was mildly dilated. - Tricuspid valve: There was moderate-severe regurgitation.   Patient Profile     77 y.o. female with  long-standing sick sinus syndrome with dual-chamber permanent pacemaker, history of previous persistent atrial fibrillation on chronic treatment with sotalol, nowadmitted with dyspnea related to respiratory infection as well as acute systolic heart failure with newly decreased LVEF to 20-25%, in the setting of persistent atrial fibrillation since last November. Additional problems include chronic left bundle branch block and CKD stage III.  Assessment & Plan    1. RUE:AVWUJ dropped EF to 20-25%. Differential diagnosis includes tachycardia related cardiomyopathy (most likely), acute myocarditis related to respiratory infection and painless ischemic heart disease. Close to estimated dry weight 140 pounds.On an angiotensin receptor blocker,  a "beta-blocker" (sotalol) and low-dose daily oral diuretic. 2. AFib:Sotalol increased to 120 mg BID. Will tolerate a little loner QTc in setting of  underlying left bundle branch block. CHADSVasc 5 (age, gender, HF, HTN). She has been taking her anticoagulant without interruption. 3. LBBB:Option for CRT if LV function does not improve. 4. CKD:  Renal function is back to baseline.  Will bring back for an echo in 2 months. TOC appointment in 1-2 weeks. OK for DC today as long as QTc in acceptable range.  For questions or updates, please contact [facility] Please consult [email or web address] for contact info under Cardiology/STEMI.      Signed, Carley Strickling, MD  04/11/2017, 10:20 AM     ADDENDUM: QTC 472 ms. OK for DC.

## 2017-04-11 NOTE — Telephone Encounter (Signed)
Patient still in hospital, tentative discharge for today 04/11/17

## 2017-04-11 NOTE — Care Management Important Message (Signed)
Important Message  Patient Details  Name: Monica Harvey MRN: [id number] Date of Birth: 1940-04-03   Medicare Important Message Given:  Yes    Monica Harvey 04/11/2017, 3:47 PM

## 2017-04-12 NOTE — Telephone Encounter (Signed)
Patient contacted regarding discharge from [facility] on 04/11/17.  Patient understands to follow up with provider Theodore Demark, PA on 04/18/17 at 11:30 am at [facility]. Patient understands discharge instructions? yes Patient understands medications and regiment? yes Patient understands to bring all medications to this visit? yes

## 2017-04-18 ENCOUNTER — Other Ambulatory Visit: Payer: Self-pay

## 2017-04-18 ENCOUNTER — Other Ambulatory Visit: Payer: Self-pay | Admitting: Physician Assistant

## 2017-04-18 ENCOUNTER — Encounter: Payer: Self-pay | Admitting: Physician Assistant

## 2017-04-18 ENCOUNTER — Ambulatory Visit (INDEPENDENT_AMBULATORY_CARE_PROVIDER_SITE_OTHER): Payer: Medicare Other | Admitting: Physician Assistant

## 2017-04-18 VITALS — BP 118/80 | HR 88 | Ht 62.0 in | Wt 142.0 lb

## 2017-04-18 DIAGNOSIS — I10 Essential (primary) hypertension: Secondary | ICD-10-CM

## 2017-04-18 DIAGNOSIS — I480 Paroxysmal atrial fibrillation: Secondary | ICD-10-CM

## 2017-04-18 DIAGNOSIS — I5042 Chronic combined systolic (congestive) and diastolic (congestive) heart failure: Secondary | ICD-10-CM

## 2017-04-18 MED ORDER — PITAVASTATIN CALCIUM 1 MG PO TABS: 1.0000 | ORAL_TABLET | Freq: Every day | ORAL | 3 refills | Status: DC

## 2017-04-18 MED ORDER — DIGOXIN 125 MCG PO TABS: 0.1250 mg | ORAL_TABLET | Freq: Every day | ORAL | 3 refills | Status: DC

## 2017-04-18 MED ORDER — FUROSEMIDE 40 MG PO TABS: 40.0000 mg | ORAL_TABLET | Freq: Every day | ORAL | 3 refills | Status: DC

## 2017-04-18 MED ORDER — SOTALOL HCL 120 MG PO TABS: 120.0000 mg | ORAL_TABLET | Freq: Two times a day (BID) | ORAL | 3 refills | Status: DC

## 2017-04-18 MED ORDER — IRBESARTAN 150 MG PO TABS: 150.0000 mg | ORAL_TABLET | Freq: Every day | ORAL | 3 refills | Status: DC

## 2017-04-18 NOTE — Patient Instructions (Signed)
Increase activity as tolerated   Weigh daily    500 mg of sodium per meal    Lab work today ( bmet )     Your physician recommends that you schedule a follow-up appointment in: 3 months with Dr.Croitoru

## 2017-04-18 NOTE — Progress Notes (Signed)
Cardiology Office Note   Date:  04/18/2017   ID:  [name], DOB 1940-06-07, MRN [id number]  PCP:  Marva Panda, NP  Cardiologist:  Dr Royann Shivers 04/08/2017 in hospital  Theodore Demark, PA-C   Chief Complaint  Patient presents with  . Follow-up    History of Present Illness: Monica Harvey is a 77 y.o. female with a history of atrial fibrillation, SSS and dual chamber pacemaker, D-CHF, GERD, HTN, HLD, LBBB, CKD III  Hospitalized 04/01-402/2019 w/ afib>>DCCV, EF now 20-25%  Monica Harvey presents for cardiology follow up.  She feels she is staying in SR.    She is not as tired as she was when she was in atrial fib.  However, she still gets short of breath with activity and does not feel that she is back to her baseline.  She gets tired pretty easily, just has to stop and rest.  She is trying to increase her activity.   She needs refills on her medications. She needs 3 month rx.  She is weighing daily at home. Her weight is 139-140 at home.   She thinks she has has some swelling in her abdomen, not in her legs.  She denies orthopnea or PND.  She is watching her sodium. She is taking the Lasix daily. She is not on potassium.   No orthostatic sx.   No chest pain with exertion.  No bleeding issues on the blood thinners.   Past Medical History:  Diagnosis Date  . Anginal pain (HCC)   . Chronic anticoagulation, CHAD2Vasc score 4 02/09/2014  . Chronic diastolic CHF (congestive heart failure) (HCC)    a. 01/2014 Echo: EF 50-55%, Gr1 DD, mild AI/MR.  . First degree atrioventricular block   . GERD (gastroesophageal reflux disease)   . Heart murmur   . HTN (hypertension)   . Hyperlipidemia   . LBBB (left bundle branch block)   . PAF (paroxysmal atrial fibrillation) (HCC)    a. Previously on flecainide;  b. 01/2014 sotalol loaded and dccv performed;  c. Chronic Savaysa Rx.  . Presence of permanent cardiac pacemaker    a. 12/26/12 s/p MDT DC PPM.  . SSS  (sick sinus syndrome) (HCC)    a. s/p PPM-Medtronic placed 12/26/12   . Symptomatic sinus bradycardia     Past Surgical History:  Procedure Laterality Date  . APPENDECTOMY  1971  . BREAST EXCISIONAL BIOPSY Right   . BREAST LUMPECTOMY Right 1980's?   "benign"  . CARDIOVERSION N/A 01/22/2014   Procedure: CARDIOVERSION;  Surgeon: Chrystie Nose, MD;  Location: [facility] ENDOSCOPY;  Service: Cardiovascular;  Laterality: N/A;  . CARDIOVERSION N/A 04/10/2017   Procedure: CARDIOVERSION;  Surgeon: Jake Bathe, MD;  Location: [facility] ENDOSCOPY;  Service: Cardiovascular;  Laterality: N/A;  . INSERT / REPLACE / REMOVE PACEMAKER  12/26/2012   Medtronic, Dr. Royann Shivers  . NM MYOCAR PERF WALL MOTION  08/05/2009   small to mod. reversible anteroseptal,septal,& apical perfusion defect. EF 59%.  Marland Kitchen PERMANENT PACEMAKER INSERTION N/A 12/26/2012   Procedure: PERMANENT PACEMAKER INSERTION;  Surgeon: Thurmon Fair, MD;  Location: [facility] CATH LAB;  Service: Cardiovascular;  Laterality: N/A;  . TONSILLECTOMY AND ADENOIDECTOMY  1960's  . TUBAL LIGATION  1971  . VAGINAL HYSTERECTOMY  1980's    Current Outpatient Medications  Medication Sig Dispense Refill  . acetaminophen (TYLENOL) 325 MG tablet Take 1-2 tablets (325-650 mg total) by mouth every 4 (four) hours as needed for mild pain. (Patient taking differently: Take  500 mg by mouth every 4 (four) hours as needed for mild pain. )    . digoxin (LANOXIN) 0.125 MG tablet Take 1 tablet (0.125 mg total) by mouth daily. 30 tablet 0  . furosemide (LASIX) 40 MG tablet Take 1 tablet (40 mg total) by mouth daily. 30 tablet 0  . irbesartan (AVAPRO) 150 MG tablet Take 1 tablet (150 mg total) by mouth daily. 30 tablet 0  . LIVALO 1 MG TABS TAKE 1 TABLET DAILY (Patient taking differently: TAKE 1mg  TABLET DAILY) 90 tablet 3  . ondansetron (ZOFRAN ODT) 4 MG disintegrating tablet Take 1 tablet (4 mg total) by mouth every 8 (eight) hours as needed for nausea or vomiting. 20 tablet 0  .  SAVAYSA 30 MG TABS tablet TAKE 1 TABLET DAILY. MAKE  AN APPOINTMENT FOR FURTHER REFILLS (Patient taking differently: TAKE 30 mg TABLET DAILY. MAKE  AN APPOINTMENT FOR FURTHER REFILLS) 90 tablet 3  . sotalol (BETAPACE) 120 MG tablet Take 1 tablet (120 mg total) by mouth every 12 (twelve) hours. 60 tablet 0   No current facility-administered medications for this visit.     Allergies:   Codeine    Social History:  The patient  reports that she has never smoked. She has never used smokeless tobacco. She reports that she does not drink alcohol or use drugs.   Family History:  The patient's family history includes Heart attack in her mother; Stroke in her sister.    ROS:  Please see the history of present illness. All other systems are reviewed and negative.    PHYSICAL EXAM: VS:  BP 118/80 (BP Location: Left Arm, Patient Position: Sitting, Cuff Size: Normal) Comment (BP Location): left  Pulse 88   Ht 5\' 2"  (1.575 m)   Wt 142 lb (64.4 kg)   BMI 25.97 kg/m  , BMI Body mass index is 25.97 kg/m. GEN: Well nourished, well developed, female in no acute distress  HEENT: normal for age  Neck: Minimal JVD, no carotid bruit, no masses Cardiac: RRR; soft murmur, no rubs, or gallops Respiratory:  clear to auscultation bilaterally, normal work of breathing GI: soft, nontender, nondistended, + BS MS: no deformity or atrophy; no edema; distal pulses are 2+ in all 4 extremities   Skin: warm and dry, no rash Neuro:  Strength and sensation are intact Psych: euthymic mood, full affect   EKG:  EKG is not ordered today.   04/08/2017 echo - Left ventricle: Wall thickness was increased in a pattern of mild LVH. Systolic function was severely reduced. The estimated ejection fraction was in the range of 20% to 25%. Dyskinesis of the anteroseptal myocardium. - Aortic valve: There was mild to moderate regurgitation. - Mitral valve: There was moderate to severe regurgitation. - Left atrium: The  atrium was moderately dilated. - Right atrium: The atrium was mildly dilated. - Tricuspid valve: There was moderate-severe regurgitation.   Recent Labs: 04/07/2017: B Natriuretic Peptide 748.3; Hemoglobin 13.5; Platelets 248 04/11/2017: BUN 40; Creatinine, Ser 1.30; Potassium 4.6; Sodium 137    Lipid Panel    Component Value Date/Time   CHOL 175 11/06/2016 0950   TRIG 99 11/06/2016 0950   HDL 63 11/06/2016 0950   CHOLHDL 2.8 11/06/2016 0950   CHOLHDL 2.5 09/01/2015 0954   VLDL 25 09/01/2015 0954   LDLCALC 92 11/06/2016 0950     Wt Readings from Last 3 Encounters:  04/18/17 142 lb (64.4 kg)  04/11/17 141 lb 3.2 oz (64 kg)  08/30/16 142 lb (  64.4 kg)     Other studies Reviewed: Additional studies/ records that were reviewed today include: Office notes, hospital records and testing.  ASSESSMENT AND PLAN:  1.  Chronic systolic CHF: She does not have significant volume overload by exam.  Continue Lasix 40 mg daily and continue to follow weights.  I feel the fatigue and dyspnea on exertion are coming from her EF being low, and deconditioning.  She is encouraged to increase her activity but do it gradually.  2.  PAF: She is in sinus rhythm by exam.  She is encouraged to be compliant with device interrogation appointments. Chelley is aware and will work with the device clinic to get her device checks scheduled.   **Check dig level at next appointment**  3.  Chronic anticoagulation: No bleeding issues, no doses missed.  4.  Hypertension: Good control on current medications, no change   Current medicines are reviewed at length with the patient today.  The patient has concerns regarding medicines.  Concerns were addressed  The following changes have been made: 3 months refills sent to her mail order  Labs/ tests ordered today include:   Orders Placed This Encounter  Procedures  . Basic metabolic panel     Disposition:   FU with Dr Royann Shivers  Signed, Theodore Demark, PA-C    04/18/2017 4:21 PM    [facility] Phone: [phone]; Fax: [phone]  This note was written with the assistance of speech recognition software. Please excuse any transcriptional errors.

## 2017-04-19 LAB — BASIC METABOLIC PANEL
BUN / CREAT RATIO: 20 (ref 12–28)
BUN: 21 mg/dL (ref 8–27)
CALCIUM: 9.8 mg/dL (ref 8.7–10.3)
CHLORIDE: 101 mmol/L (ref 96–106)
CO2: 24 mmol/L (ref 20–29)
Creatinine, Ser: 1.04 mg/dL — ABNORMAL HIGH (ref 0.57–1.00)
GFR, EST AFRICAN AMERICAN: 61 mL/min/{1.73_m2} (ref >59)
GFR, EST NON AFRICAN AMERICAN: 53 mL/min/{1.73_m2} — AB (ref >59)
Glucose: 93 mg/dL (ref 65–99)
Potassium: 4.3 mmol/L (ref 3.5–5.2)
Sodium: 140 mmol/L (ref 134–144)

## 2017-04-19 NOTE — Progress Notes (Signed)
I think it's always best to confirm the rhythm is normal either by ECG or pacer interrogation - may have atrial fibrillation with slow response and regular ventricular pacing. She was in AF for 3 months and did not realize it. MCr

## 2017-05-15 ENCOUNTER — Other Ambulatory Visit: Payer: Self-pay | Admitting: Cardiovascular Disease

## 2017-05-15 NOTE — Telephone Encounter (Signed)
Rx(s) sent to pharmacy electronically.  

## 2017-05-20 ENCOUNTER — Ambulatory Visit (INDEPENDENT_AMBULATORY_CARE_PROVIDER_SITE_OTHER): Payer: Medicare Other | Admitting: *Deleted

## 2017-05-20 DIAGNOSIS — I495 Sick sinus syndrome: Secondary | ICD-10-CM

## 2017-05-20 DIAGNOSIS — I480 Paroxysmal atrial fibrillation: Secondary | ICD-10-CM | POA: Diagnosis not present

## 2017-05-20 LAB — CUP PACEART INCLINIC DEVICE CHECK
Battery Remaining Longevity: 75 mo
Battery Voltage: 3.01 V
Brady Statistic AP VS Percent: 99.69 %
Brady Statistic RA Percent Paced: 99.69 %
Brady Statistic RV Percent Paced: 0.04 %
Date Time Interrogation Session: 20190513112334
Implantable Lead Implant Date: 20141219
Implantable Lead Location: 753859
Implantable Lead Location: 753860
Implantable Lead Model: 5076
Implantable Pulse Generator Implant Date: 20141219
Lead Channel Impedance Value: 418 Ohm
Lead Channel Impedance Value: 475 Ohm
Lead Channel Pacing Threshold Amplitude: 0.75 V
Lead Channel Pacing Threshold Amplitude: 0.875 V
Lead Channel Pacing Threshold Pulse Width: 0.4 ms
Lead Channel Sensing Intrinsic Amplitude: 3.375 mV
Lead Channel Sensing Intrinsic Amplitude: 4.875 mV
Lead Channel Sensing Intrinsic Amplitude: 8 mV
Lead Channel Setting Sensing Sensitivity: 0.9 mV
MDC IDC LEAD IMPLANT DT: 20141219
MDC IDC MSMT LEADCHNL RA IMPEDANCE VALUE: 380 Ohm
MDC IDC MSMT LEADCHNL RV IMPEDANCE VALUE: 399 Ohm
MDC IDC MSMT LEADCHNL RV PACING THRESHOLD PULSEWIDTH: 0.4 ms
MDC IDC MSMT LEADCHNL RV SENSING INTR AMPL: 7.875 mV
MDC IDC SET LEADCHNL RA PACING AMPLITUDE: 2 V
MDC IDC SET LEADCHNL RV PACING AMPLITUDE: 2.5 V
MDC IDC SET LEADCHNL RV PACING PULSEWIDTH: 0.4 ms
MDC IDC STAT BRADY AP VP PERCENT: 0.04 %
MDC IDC STAT BRADY AS VP PERCENT: 0 %
MDC IDC STAT BRADY AS VS PERCENT: 0.27 %

## 2017-05-20 NOTE — Progress Notes (Signed)
Pacemaker check in clinic. Normal device function. Thresholds, sensing, impedances consistent with previous measurements. Device programmed to maximize longevity. 1 AT/AF episode lasting >100hrs starting 02/17/17- known PAF, on Savaysa. No high ventricular rates noted. Device programmed at appropriate safety margins. Histogram distribution appropriate for patient activity level. Device programmed to optimize intrinsic conduction. Estimated longevity 4.5-7.5 years. Patient enrolled in remote follow-up. Carelink Smart monitor paired with husband's iphone and tested in office. OV with Dr. Royann Shivers 07/19/17, Carelink 08/19/17.

## 2017-07-19 ENCOUNTER — Encounter: Payer: Medicare Other | Admitting: Cardiovascular Disease

## 2017-07-25 ENCOUNTER — Ambulatory Visit (INDEPENDENT_AMBULATORY_CARE_PROVIDER_SITE_OTHER): Payer: Medicare Other | Admitting: Cardiovascular Disease

## 2017-07-25 ENCOUNTER — Encounter: Payer: Self-pay | Admitting: Cardiovascular Disease

## 2017-07-25 VITALS — BP 110/62 | HR 77 | Ht 63.0 in | Wt 141.4 lb

## 2017-07-25 DIAGNOSIS — I5042 Chronic combined systolic (congestive) and diastolic (congestive) heart failure: Secondary | ICD-10-CM | POA: Diagnosis not present

## 2017-07-25 DIAGNOSIS — E7800 Pure hypercholesterolemia, unspecified: Secondary | ICD-10-CM

## 2017-07-25 DIAGNOSIS — I4891 Unspecified atrial fibrillation: Secondary | ICD-10-CM | POA: Diagnosis not present

## 2017-07-25 DIAGNOSIS — Z950 Presence of cardiac pacemaker: Secondary | ICD-10-CM

## 2017-07-25 DIAGNOSIS — Z7901 Long term (current) use of anticoagulants: Secondary | ICD-10-CM | POA: Diagnosis not present

## 2017-07-25 DIAGNOSIS — I10 Essential (primary) hypertension: Secondary | ICD-10-CM

## 2017-07-25 DIAGNOSIS — Z79899 Other long term (current) drug therapy: Secondary | ICD-10-CM

## 2017-07-25 DIAGNOSIS — I495 Sick sinus syndrome: Secondary | ICD-10-CM

## 2017-07-25 DIAGNOSIS — Z5181 Encounter for therapeutic drug level monitoring: Secondary | ICD-10-CM

## 2017-07-25 MED ORDER — FUROSEMIDE 40 MG PO TABS: 40.0000 mg | ORAL_TABLET | ORAL | 5 refills | Status: DC

## 2017-07-25 NOTE — Progress Notes (Signed)
.    Cardiology Office Note    Date:  07/26/2017   ID:  Monica Harvey, DOB [birth year], MRN [id number]  PCP:  Marva Panda, NP  Cardiologist:   Thurmon Fair, MD   Chief Complaint  Patient presents with  . office visit    pacer check, denies chest pains, does have medication quesitons on Savaysa    History of Present Illness:  Monica Harvey is a 77 y.o. female with tachycardia/bradycardia syndrome, paroxysmal atrial fibrillation complicated by tachycardia cardiomyopathy and heart failure, left bundle branch block who presents for follow up.    She required cardioversion for atrial fibrillation in 2016, but subsequently did well on antiarrhythmic therapy with sotalol, until early 2019 when she developed acute heart failure following a 92-month episode of atrial fibrillation rapid ventricular response.  Left ventricular ejection fraction decreased to 20-25% on her April 2019 echocardiogram.  She underwent cardioversion on April 3 and has maintained sinus rhythm since then.  She feels well and is back to her usual level of activity and good functional status.  She is compliant with anticoagulation and has not had any bleeding problems.  The patient specifically denies any chest pain at rest exertion, dyspnea at rest or with exertion, orthopnea, paroxysmal nocturnal dyspnea, syncope, palpitations, focal neurological deficits, intermittent claudication, lower extremity edema, unexplained weight gain, cough, hemoptysis or wheezing.  Pacemaker check shows normal device function. She has 99.6 % atrial pacing, no ventricular pacing. The device was implanted in 2014 and has 5.5 years of expected longevity (Medtronic Advisa).  There have been no episodes of high ventricular rates.  Her activity has steadily increased since her cardioversion and is now up to an average of over 3 hours a day  She has treated hypertension and hyperlipidemia, mildly depressed left ventricular ejection fraction  (EF 50% by echo) without clinical heart failure, chronic left bundle branch block and a mildly abnormal myocardial perfusion study (small to moderate reversible anteroseptal septal and apical perfusion defect felt to be consistent with LAD ischemia superimposed on underlying LBBB artifact).    Past Medical History:  Diagnosis Date  . Anginal pain (HCC)   . Chronic anticoagulation, CHAD2Vasc score 4 02/09/2014  . Chronic diastolic CHF (congestive heart failure) (HCC)    a. 01/2014 Echo: EF 50-55%, Gr1 DD, mild AI/MR.  . First degree atrioventricular block   . GERD (gastroesophageal reflux disease)   . Heart murmur   . HTN (hypertension)   . Hyperlipidemia   . LBBB (left bundle branch block)   . PAF (paroxysmal atrial fibrillation) (HCC)    a. Previously on flecainide;  b. 01/2014 sotalol loaded and dccv performed;  c. Chronic Savaysa Rx.  . Presence of permanent cardiac pacemaker    a. 12/26/12 s/p MDT DC PPM.  . SSS (sick sinus syndrome) (HCC)    a. s/p PPM-Medtronic placed 12/26/12   . Symptomatic sinus bradycardia     Past Surgical History:  Procedure Laterality Date  . APPENDECTOMY  1971  . BREAST EXCISIONAL BIOPSY Right   . BREAST LUMPECTOMY Right 1980's?   "benign"  . CARDIOVERSION N/A 01/22/2014   Procedure: CARDIOVERSION;  Surgeon: Chrystie Nose, MD;  Location: [facility] ENDOSCOPY;  Service: Cardiovascular;  Laterality: N/A;  . CARDIOVERSION N/A 04/10/2017   Procedure: CARDIOVERSION;  Surgeon: Jake Bathe, MD;  Location: [facility] ENDOSCOPY;  Service: Cardiovascular;  Laterality: N/A;  . INSERT / REPLACE / REMOVE PACEMAKER  12/26/2012   Medtronic, Dr. Royann Shivers  . NM MYOCAR PERF  WALL MOTION  08/05/2009   small to mod. reversible anteroseptal,septal,& apical perfusion defect. EF 59%.  Marland Kitchen PERMANENT PACEMAKER INSERTION N/A 12/26/2012   Procedure: PERMANENT PACEMAKER INSERTION;  Surgeon: Thurmon Fair, MD;  Location: [facility] CATH LAB;  Service: Cardiovascular;  Laterality: N/A;  .  TONSILLECTOMY AND ADENOIDECTOMY  1960's  . TUBAL LIGATION  1971  . VAGINAL HYSTERECTOMY  1980's    Current Medications: Outpatient Medications Prior to Visit  Medication Sig Dispense Refill  . acetaminophen (TYLENOL) 325 MG tablet Take 1-2 tablets (325-650 mg total) by mouth every 4 (four) hours as needed for mild pain. (Patient taking differently: Take 500 mg by mouth every 4 (four) hours as needed for mild pain. )    . irbesartan (AVAPRO) 150 MG tablet Take 1 tablet (150 mg total) by mouth daily. 90 tablet 3  . ondansetron (ZOFRAN ODT) 4 MG disintegrating tablet Take 1 tablet (4 mg total) by mouth every 8 (eight) hours as needed for nausea or vomiting. 20 tablet 0  . Pitavastatin Calcium (LIVALO) 1 MG TABS Take 1 tablet (1 mg total) by mouth daily. 90 tablet 3  . SAVAYSA 30 MG TABS tablet TAKE 1 TABLET DAILY. MAKE  AN APPOINTMENT FOR FURTHER REFILLS (Patient taking differently: TAKE 30 mg TABLET DAILY. MAKE  AN APPOINTMENT FOR FURTHER REFILLS) 90 tablet 3  . digoxin (LANOXIN) 0.125 MG tablet Take 1 tablet (0.125 mg total) by mouth daily. 90 tablet 3  . furosemide (LASIX) 40 MG tablet TAKE 1 TABLET BY MOUTH EVERY DAY 30 tablet 11  . sotalol (BETAPACE) 120 MG tablet Take 1 tablet (120 mg total) by mouth every 12 (twelve) hours. 180 tablet 3  . furosemide (LASIX) 40 MG tablet Take 1 tablet (40 mg total) by mouth daily. 90 tablet 3   No facility-administered medications prior to visit.      Allergies:   Codeine   Social History   Socioeconomic History  . Marital status: Married    Spouse name: Not on file  . Number of children: Not on file  . Years of education: Not on file  . Highest education level: Not on file  Occupational History  . Not on file  Social Needs  . Financial resource strain: Not on file  . Food insecurity:    Worry: Not on file    Inability: Not on file  . Transportation needs:    Medical: Not on file    Non-medical: Not on file  Tobacco Use  . Smoking  status: Never Smoker  . Smokeless tobacco: Never Used  Substance and Sexual Activity  . Alcohol use: No  . Drug use: No  . Sexual activity: Not Currently  Lifestyle  . Physical activity:    Days per week: Not on file    Minutes per session: Not on file  . Stress: Not on file  Relationships  . Social connections:    Talks on phone: Not on file    Gets together: Not on file    Attends religious service: Not on file    Active member of club or organization: Not on file    Attends meetings of clubs or organizations: Not on file    Relationship status: Not on file  Other Topics Concern  . Not on file  Social History Narrative  . Not on file     Family History:  The patient's family history includes Heart attack in her mother; Stroke in her sister.   ROS:   Please see  the history of present illness.    ROS all other systems reviewed and are negative  PHYSICAL EXAM:   VS:  BP 110/62 (BP Location: Left Arm, Patient Position: Sitting)   Pulse 77   Ht 5\' 3"  (1.6 m)   Wt 141 lb 6.4 oz (64.1 kg)   SpO2 97%   BMI 25.05 kg/m     General: Alert, oriented x3, no distress, healthy left subclavian pacemaker site Head: no evidence of trauma, PERRL, EOMI, no exophtalmos or lid lag, no myxedema, no xanthelasma; normal ears, nose and oropharynx Neck: normal jugular venous pulsations and no hepatojugular reflux; brisk carotid pulses without delay and no carotid bruits Chest: clear to auscultation, no signs of consolidation by percussion or palpation, normal fremitus, symmetrical and full respiratory excursions Cardiovascular: normal position and quality of the apical impulse, regular rhythm, normal first and paradoxically split second heart sounds, no murmurs, rubs or gallops Abdomen: no tenderness or distention, no masses by palpation, no abnormal pulsatility or arterial bruits, normal bowel sounds, no hepatosplenomegaly Extremities: no clubbing, cyanosis or edema; 2+ radial, ulnar and  brachial pulses bilaterally; 2+ right femoral, posterior tibial and dorsalis pedis pulses; 2+ left femoral, posterior tibial and dorsalis pedis pulses; no subclavian or femoral bruits Neurological: grossly nonfocal Psych: Normal mood and affect    Wt Readings from Last 3 Encounters:  07/25/17 141 lb 6.4 oz (64.1 kg)  04/18/17 142 lb (64.4 kg)  04/11/17 141 lb 3.2 oz (64 kg)      Studies/Labs Reviewed:   EKG:  EKG is ordered today.  Intracardiac electrogram shows atrial paced ventricular sensed rhythm.  Her last EKG performed April 11, 2017 shows atrial paced ventricular sensed rhythm with a left bundle branch block with a QRS duration of 132 ms and a corrected QT interval of 472 ms  Recent Labs: 04/07/2017: B Natriuretic Peptide 748.3; Hemoglobin 13.5; Platelets 248 04/18/2017: BUN 21; Creatinine, Ser 1.04; Potassium 4.3; Sodium 140   Lipid Panel    Component Value Date/Time   CHOL 175 11/06/2016 0950   TRIG 99 11/06/2016 0950   HDL 63 11/06/2016 0950   CHOLHDL 2.8 11/06/2016 0950   CHOLHDL 2.5 09/01/2015 0954   VLDL 25 09/01/2015 0954   LDLCALC 92 11/06/2016 0950      ASSESSMENT:    1. Chronic combined systolic and diastolic CHF, NYHA class 1 (HCC)   2. Atrial fibrillation with RVR (HCC)   3. Encounter for monitoring sotalol therapy   4. Long term current use of anticoagulant   5. SSS (sick sinus syndrome) (HCC)   6. Pacemaker   7. Hypercholesterolemia   8. Essential hypertension      PLAN:  In order of problems listed above:  1. CHF: Clinically euvolemic, good functional status.  EF was down to 20-25% on the echo in April, but suspect this has improved.  She is on treatment with irbesartan.  Sotalol serves as her beta-blocker.  She is on a fairly low dose of loop diuretic, but I suspect we can reduce this even further.  I think we can stop her digoxin.  Recheck a limited echo for EF.  If left ventricular systolic function is still severely depressed, then she did not  just have tachycardia cardiomyopathy and she should be referred for CRT. 2. AFib: She is unaware of the episodes of arrhythmia.  Hence she developed tachycardia cardiomyopathy following a prolonged episode of sustained atrial fibrillation rapid ventricular response.  She is on appropriate anticoagulation.  CHADSVasc 4-5 (  age, gender, HTN, CHF, possible CAD). 3. Sotalol: Well-tolerated.  With the exception of the prolonged episode earlier this year has worked well to prevent atrial fibrillation.  QTc is not excessively prolonged.. Needs electrolytes, renal function and ECG checked every 6 months. 4. Savaysa: No bleeding complications.  Compliant with anticoagulation. 5. SSS: Despite virtually 100% atrial pacing, with current sensor settings has appropriate heart rate response to activity. 6. PPM: She has a left bundle branch block but has never requires ventricular pacing.  Continue remote downloads every 3 months.  Office visit every 6 months as long as she is on sotalol 7. HLP: Has not tolerated higher doses of Livalo or any other statins.  LDL under 100 is probably acceptable. 8. HTN: Well-controlled.   Medication Adjustments/Labs and Tests Ordered: Current medicines are reviewed at length with the patient today.  Concerns regarding medicines are outlined above.  Medication changes, Labs and Tests ordered today are listed in the Patient Instructions below. Patient Instructions  Dr Royann Shivers has recommended making the following medication changes: 1. STOP Digoxin 2. DECREASE Furosemide to 3 days a week - Monday, Wednesday, and Friday  Your physician has requested that you have a limited echocardiogram. Echocardiography is a painless test that uses sound waves to create images of your heart. It provides your doctor with information about the size and shape of your heart and how well your heart's chambers and valves are working. This procedure takes approximately one hour. There are no restrictions  for this procedure.  Remote monitoring is used to monitor your Pacemaker or ICD from home. This monitoring reduces the number of office visits required to check your device to one time per year. It allows [place] to keep an eye on the functioning of your device to ensure it is working properly. You are scheduled for a device check from home on Monday, August 12th, 2019. You may send your transmission at any time that day. If you have a wireless device, the transmission will be sent automatically. After your physician reviews your transmission, you will receive a postcard with your next transmission date.  To improve our patient care and to more adequately follow your device, [facility] has decided, as a practice, to start following each patient four times a year with your home monitor. This means that you may experience a remote appointment that is close to an in-office appointment with your physician. Your insurance will apply at the same rate as other remote monitoring transmissions.  Dr Royann Shivers recommends that you schedule a follow-up appointment in 3 months with a pacemaker check.  If you need a refill on your cardiac medications before your next appointment, please call your pharmacy.    Signed, Thurmon Fair, MD  07/26/2017 3:52 PM    [facility] [address] [place], [place], [place]  [address] Phone: [phone]; Fax: [phone]

## 2017-07-25 NOTE — Patient Instructions (Signed)
Dr Royann Shivers has recommended making the following medication changes: 1. STOP Digoxin 2. DECREASE Furosemide to 3 days a week - Monday, Wednesday, and Friday  Your physician has requested that you have a limited echocardiogram. Echocardiography is a painless test that uses sound waves to create images of your heart. It provides your doctor with information about the size and shape of your heart and how well your heart's chambers and valves are working. This procedure takes approximately one hour. There are no restrictions for this procedure.  Remote monitoring is used to monitor your Pacemaker or ICD from home. This monitoring reduces the number of office visits required to check your device to one time per year. It allows [place] to keep an eye on the functioning of your device to ensure it is working properly. You are scheduled for a device check from home on Monday, August 12th, 2019. You may send your transmission at any time that day. If you have a wireless device, the transmission will be sent automatically. After your physician reviews your transmission, you will receive a postcard with your next transmission date.  To improve our patient care and to more adequately follow your device, [facility] has decided, as a practice, to start following each patient four times a year with your home monitor. This means that you may experience a remote appointment that is close to an in-office appointment with your physician. Your insurance will apply at the same rate as other remote monitoring transmissions.  Dr Royann Shivers recommends that you schedule a follow-up appointment in 3 months with a pacemaker check.  If you need a refill on your cardiac medications before your next appointment, please call your pharmacy.

## 2017-08-05 ENCOUNTER — Ambulatory Visit (HOSPITAL_COMMUNITY): Payer: Medicare Other | Attending: Internal Medicine

## 2017-08-05 ENCOUNTER — Other Ambulatory Visit: Payer: Self-pay

## 2017-08-05 DIAGNOSIS — I495 Sick sinus syndrome: Secondary | ICD-10-CM | POA: Insufficient documentation

## 2017-08-05 DIAGNOSIS — I272 Pulmonary hypertension, unspecified: Secondary | ICD-10-CM | POA: Insufficient documentation

## 2017-08-05 DIAGNOSIS — I4891 Unspecified atrial fibrillation: Secondary | ICD-10-CM | POA: Insufficient documentation

## 2017-08-05 DIAGNOSIS — N183 Chronic kidney disease, stage 3 (moderate): Secondary | ICD-10-CM | POA: Insufficient documentation

## 2017-08-05 DIAGNOSIS — Z950 Presence of cardiac pacemaker: Secondary | ICD-10-CM | POA: Diagnosis not present

## 2017-08-05 DIAGNOSIS — I5042 Chronic combined systolic (congestive) and diastolic (congestive) heart failure: Secondary | ICD-10-CM | POA: Diagnosis present

## 2017-08-05 DIAGNOSIS — E785 Hyperlipidemia, unspecified: Secondary | ICD-10-CM | POA: Diagnosis not present

## 2017-08-05 DIAGNOSIS — I440 Atrioventricular block, first degree: Secondary | ICD-10-CM | POA: Diagnosis not present

## 2017-08-05 DIAGNOSIS — I130 Hypertensive heart and chronic kidney disease with heart failure and stage 1 through stage 4 chronic kidney disease, or unspecified chronic kidney disease: Secondary | ICD-10-CM | POA: Insufficient documentation

## 2017-08-05 DIAGNOSIS — I447 Left bundle-branch block, unspecified: Secondary | ICD-10-CM | POA: Insufficient documentation

## 2017-08-05 DIAGNOSIS — I083 Combined rheumatic disorders of mitral, aortic and tricuspid valves: Secondary | ICD-10-CM | POA: Insufficient documentation

## 2017-08-05 DIAGNOSIS — I77810 Thoracic aortic ectasia: Secondary | ICD-10-CM | POA: Insufficient documentation

## 2017-08-19 ENCOUNTER — Ambulatory Visit (INDEPENDENT_AMBULATORY_CARE_PROVIDER_SITE_OTHER): Payer: Medicare Other | Admitting: *Deleted

## 2017-08-19 ENCOUNTER — Telehealth: Payer: Self-pay | Admitting: Cardiology

## 2017-08-19 DIAGNOSIS — I495 Sick sinus syndrome: Secondary | ICD-10-CM | POA: Diagnosis not present

## 2017-08-19 NOTE — Telephone Encounter (Signed)
LMOVM reminding pt to send remote transmission.   

## 2017-08-20 ENCOUNTER — Encounter: Payer: Self-pay | Admitting: Cardiology

## 2017-08-20 NOTE — Progress Notes (Signed)
Remote pacemaker transmission.   

## 2017-09-11 LAB — CUP PACEART REMOTE DEVICE CHECK
Brady Statistic AP VP Percent: 0.04 %
Brady Statistic AP VS Percent: 99.47 %
Brady Statistic AS VS Percent: 0.48 %
Brady Statistic RV Percent Paced: 0.04 %
Implantable Lead Implant Date: 20141219
Implantable Lead Implant Date: 20141219
Implantable Lead Location: 753859
Implantable Lead Model: 5076
Lead Channel Impedance Value: 361 Ohm
Lead Channel Impedance Value: 380 Ohm
Lead Channel Impedance Value: 399 Ohm
Lead Channel Impedance Value: 456 Ohm
Lead Channel Pacing Threshold Amplitude: 0.75 V
Lead Channel Pacing Threshold Amplitude: 0.875 V
Lead Channel Pacing Threshold Pulse Width: 0.4 ms
Lead Channel Sensing Intrinsic Amplitude: 3.5 mV
Lead Channel Sensing Intrinsic Amplitude: 6.5 mV
Lead Channel Setting Pacing Amplitude: 2 V
Lead Channel Setting Pacing Amplitude: 2.5 V
Lead Channel Setting Sensing Sensitivity: 0.9 mV
MDC IDC LEAD LOCATION: 753860
MDC IDC MSMT BATTERY REMAINING LONGEVITY: 64 mo
MDC IDC MSMT BATTERY VOLTAGE: 3 V
MDC IDC MSMT LEADCHNL RA PACING THRESHOLD PULSEWIDTH: 0.4 ms
MDC IDC MSMT LEADCHNL RA SENSING INTR AMPL: 3.5 mV
MDC IDC MSMT LEADCHNL RV SENSING INTR AMPL: 6.5 mV
MDC IDC PG IMPLANT DT: 20141219
MDC IDC SESS DTM: 20190813173253
MDC IDC SET LEADCHNL RV PACING PULSEWIDTH: 0.4 ms
MDC IDC STAT BRADY AS VP PERCENT: 0 %
MDC IDC STAT BRADY RA PERCENT PACED: 99.51 %

## 2017-09-20 ENCOUNTER — Other Ambulatory Visit: Payer: Self-pay | Admitting: Cardiovascular Disease

## 2017-10-02 ENCOUNTER — Other Ambulatory Visit: Payer: Self-pay | Admitting: *Deleted

## 2017-10-02 DIAGNOSIS — Z1231 Encounter for screening mammogram for malignant neoplasm of breast: Secondary | ICD-10-CM

## 2017-10-31 ENCOUNTER — Ambulatory Visit (INDEPENDENT_AMBULATORY_CARE_PROVIDER_SITE_OTHER): Payer: Medicare Other | Admitting: Cardiovascular Disease

## 2017-10-31 ENCOUNTER — Encounter: Payer: Self-pay | Admitting: Cardiovascular Disease

## 2017-10-31 VITALS — BP 114/62 | HR 64 | Ht 63.0 in | Wt 144.0 lb

## 2017-10-31 DIAGNOSIS — I5032 Chronic diastolic (congestive) heart failure: Secondary | ICD-10-CM | POA: Diagnosis not present

## 2017-10-31 DIAGNOSIS — I495 Sick sinus syndrome: Secondary | ICD-10-CM

## 2017-10-31 DIAGNOSIS — Z950 Presence of cardiac pacemaker: Secondary | ICD-10-CM

## 2017-10-31 DIAGNOSIS — J329 Chronic sinusitis, unspecified: Secondary | ICD-10-CM | POA: Insufficient documentation

## 2017-10-31 DIAGNOSIS — I447 Left bundle-branch block, unspecified: Secondary | ICD-10-CM

## 2017-10-31 DIAGNOSIS — E785 Hyperlipidemia, unspecified: Secondary | ICD-10-CM

## 2017-10-31 DIAGNOSIS — I480 Paroxysmal atrial fibrillation: Secondary | ICD-10-CM | POA: Diagnosis not present

## 2017-10-31 DIAGNOSIS — Z5181 Encounter for therapeutic drug level monitoring: Secondary | ICD-10-CM | POA: Diagnosis not present

## 2017-10-31 DIAGNOSIS — I10 Essential (primary) hypertension: Secondary | ICD-10-CM

## 2017-10-31 DIAGNOSIS — Z79899 Other long term (current) drug therapy: Secondary | ICD-10-CM

## 2017-10-31 DIAGNOSIS — Z7901 Long term (current) use of anticoagulants: Secondary | ICD-10-CM

## 2017-10-31 LAB — COMPREHENSIVE METABOLIC PANEL
A/G RATIO: 1.9 (ref 1.2–2.2)
ALBUMIN: 4.3 g/dL (ref 3.5–4.8)
ALK PHOS: 133 IU/L — AB (ref 39–117)
ALT: 20 IU/L (ref 0–32)
AST: 21 IU/L (ref 0–40)
BILIRUBIN TOTAL: 0.5 mg/dL (ref 0.0–1.2)
BUN / CREAT RATIO: 20 (ref 12–28)
BUN: 22 mg/dL (ref 8–27)
CHLORIDE: 103 mmol/L (ref 96–106)
CO2: 23 mmol/L (ref 20–29)
Calcium: 9.8 mg/dL (ref 8.7–10.3)
Creatinine, Ser: 1.12 mg/dL — ABNORMAL HIGH (ref 0.57–1.00)
GFR calc non Af Amer: 48 mL/min/{1.73_m2} — ABNORMAL LOW (ref >59)
GFR, EST AFRICAN AMERICAN: 56 mL/min/{1.73_m2} — AB (ref >59)
GLOBULIN, TOTAL: 2.3 g/dL (ref 1.5–4.5)
Glucose: 82 mg/dL (ref 65–99)
Potassium: 4.4 mmol/L (ref 3.5–5.2)
SODIUM: 142 mmol/L (ref 134–144)
Total Protein: 6.6 g/dL (ref 6.0–8.5)

## 2017-10-31 LAB — LIPID PANEL
CHOLESTEROL TOTAL: 170 mg/dL (ref 100–199)
Chol/HDL Ratio: 2.8 ratio (ref 0.0–4.4)
HDL: 61 mg/dL (ref >39)
LDL Calculated: 87 mg/dL (ref 0–99)
TRIGLYCERIDES: 109 mg/dL (ref 0–149)
VLDL CHOLESTEROL CAL: 22 mg/dL (ref 5–40)

## 2017-10-31 NOTE — Progress Notes (Signed)
.    Cardiology Office Note    Date:  10/31/2017   ID:  Monica Harvey, DOB [birth year], MRN [id number]  PCP:  Marva Panda, NP  Cardiologist:   Thurmon Fair, MD   Chief Complaint  Patient presents with  . Atrial Fibrillation  . Pacemaker Check    History of Present Illness:  Monica Harvey is a 77 y.o. female with tachycardia/bradycardia syndrome, paroxysmal atrial fibrillation complicated by tachycardia cardiomyopathy and heart failure, left bundle branch block who presents for follow up.    She required cardioversion for atrial fibrillation in 2016, but subsequently did well on antiarrhythmic therapy with sotalol, until early 2019 when she developed acute heart failure following a 24-month episode of atrial fibrillation rapid ventricular response.  Left ventricular ejection fraction decreased to 20-25% on her April 2019 echocardiogram.  She underwent cardioversion on April 3 and has maintained sinus rhythm since then.  She feels well and is back to her usual level of activity and good functional status.  She is compliant with anticoagulation and has not had any bleeding problems.  Follow-up echocardiogram on July 29 showed improvement of EF back to her previous baseline of 50%.  The patient specifically denies any chest pain at rest exertion, dyspnea at rest or with exertion, orthopnea, paroxysmal nocturnal dyspnea, syncope, palpitations, focal neurological deficits, intermittent claudication, lower extremity edema, unexplained weight gain, cough, hemoptysis or wheezing.  Pacemaker interrogation (Medtronic Advisa) today shows that she has not had any recent atrial fibrillation.  She presents in atrial paced, ventricular sensed rhythm and as before has almost 100% atrial pacing.  Virtually never requires pacing.  Generator and lead parameters are excellent.  Has roughly 5 years of estimated generator longevity.  Activity is excellent at almost 4 hours a day.  The patient  specifically denies any chest pain at rest exertion, dyspnea at rest or with exertion, orthopnea, paroxysmal nocturnal dyspnea, syncope, palpitations, focal neurological deficits, intermittent claudication, lower extremity edema, unexplained weight gain, cough, hemoptysis or wheezing.   She has treated hypertension and hyperlipidemia, mildly depressed left ventricular ejection fraction (EF 50% by echo) without clinical heart failure, chronic left bundle branch block and a mildly abnormal myocardial perfusion study (small to moderate reversible anteroseptal septal and apical perfusion defect felt to be consistent with LAD ischemia superimposed on underlying LBBB artifact).  Past Medical History:  Diagnosis Date  . Anginal pain (HCC)   . Chronic anticoagulation, CHAD2Vasc score 4 02/09/2014  . Chronic diastolic CHF (congestive heart failure) (HCC)    a. 01/2014 Echo: EF 50-55%, Gr1 DD, mild AI/MR.  . First degree atrioventricular block   . GERD (gastroesophageal reflux disease)   . Heart murmur   . HTN (hypertension)   . Hyperlipidemia   . LBBB (left bundle branch block)   . PAF (paroxysmal atrial fibrillation) (HCC)    a. Previously on flecainide;  b. 01/2014 sotalol loaded and dccv performed;  c. Chronic Savaysa Rx.  . Presence of permanent cardiac pacemaker    a. 12/26/12 s/p MDT DC PPM.  . SSS (sick sinus syndrome) (HCC)    a. s/p PPM-Medtronic placed 12/26/12   . Symptomatic sinus bradycardia     Past Surgical History:  Procedure Laterality Date  . APPENDECTOMY  1971  . BREAST EXCISIONAL BIOPSY Right   . BREAST LUMPECTOMY Right 1980's?   "benign"  . CARDIOVERSION N/A 01/22/2014   Procedure: CARDIOVERSION;  Surgeon: Chrystie Nose, MD;  Location: [facility] ENDOSCOPY;  Service: Cardiovascular;  Laterality: N/A;  .  CARDIOVERSION N/A 04/10/2017   Procedure: CARDIOVERSION;  Surgeon: Jake Bathe, MD;  Location: [facility] ENDOSCOPY;  Service: Cardiovascular;  Laterality: N/A;  . INSERT / REPLACE /  REMOVE PACEMAKER  12/26/2012   Medtronic, Dr. Royann Shivers  . NM MYOCAR PERF WALL MOTION  08/05/2009   small to mod. reversible anteroseptal,septal,& apical perfusion defect. EF 59%.  Marland Kitchen PERMANENT PACEMAKER INSERTION N/A 12/26/2012   Procedure: PERMANENT PACEMAKER INSERTION;  Surgeon: Thurmon Fair, MD;  Location: [facility] CATH LAB;  Service: Cardiovascular;  Laterality: N/A;  . TONSILLECTOMY AND ADENOIDECTOMY  1960's  . TUBAL LIGATION  1971  . VAGINAL HYSTERECTOMY  1980's    Current Medications: Outpatient Medications Prior to Visit  Medication Sig Dispense Refill  . acetaminophen (TYLENOL) 325 MG tablet Take 1-2 tablets (325-650 mg total) by mouth every 4 (four) hours as needed for mild pain. (Patient taking differently: Take 500 mg by mouth every 4 (four) hours as needed for mild pain. )    . furosemide (LASIX) 40 MG tablet Take 1 tablet (40 mg total) by mouth 3 (three) times a week. Mondays, Wednesdays, and Fridays 30 tablet 5  . irbesartan (AVAPRO) 150 MG tablet Take 1 tablet (150 mg total) by mouth daily. 90 tablet 3  . ondansetron (ZOFRAN ODT) 4 MG disintegrating tablet Take 1 tablet (4 mg total) by mouth every 8 (eight) hours as needed for nausea or vomiting. 20 tablet 0  . Pitavastatin Calcium (LIVALO) 1 MG TABS Take 1 tablet (1 mg total) by mouth daily. 90 tablet 3  . SAVAYSA 30 MG TABS tablet TAKE 1 TABLET DAILY. MAKE  AN APPOINTMENT FOR FURTHER REFILLS 90 tablet 3  . sotalol (BETAPACE) 120 MG tablet Take 1 tablet (120 mg total) by mouth every 12 (twelve) hours. 180 tablet 3   No facility-administered medications prior to visit.      Allergies:   Codeine   Social History   Socioeconomic History  . Marital status: Married    Spouse name: Not on file  . Number of children: Not on file  . Years of education: Not on file  . Highest education level: Not on file  Occupational History  . Not on file  Social Needs  . Financial resource strain: Not on file  . Food insecurity:    Worry:  Not on file    Inability: Not on file  . Transportation needs:    Medical: Not on file    Non-medical: Not on file  Tobacco Use  . Smoking status: Never Smoker  . Smokeless tobacco: Never Used  Substance and Sexual Activity  . Alcohol use: No  . Drug use: No  . Sexual activity: Not Currently  Lifestyle  . Physical activity:    Days per week: Not on file    Minutes per session: Not on file  . Stress: Not on file  Relationships  . Social connections:    Talks on phone: Not on file    Gets together: Not on file    Attends religious service: Not on file    Active member of club or organization: Not on file    Attends meetings of clubs or organizations: Not on file    Relationship status: Not on file  Other Topics Concern  . Not on file  Social History Narrative  . Not on file     Family History:  The patient's family history includes Heart attack in her mother; Stroke in her sister.   ROS:   Please see  the history of present illness.    ROS other systems are reviewed and are negative  PHYSICAL EXAM:   VS:  BP 114/62 (BP Location: Right Arm, Patient Position: Sitting, Cuff Size: Normal)   Pulse 64   Ht 5\' 3"  (1.6 m)   Wt 144 lb (65.3 kg)   SpO2 97%   BMI 25.51 kg/m      General: Alert, oriented x3, no distress, appears well, lean, healthy left subclavian pacemaker site Head: no evidence of trauma, PERRL, EOMI, no exophtalmos or lid lag, no myxedema, no xanthelasma; normal ears, nose and oropharynx Neck: normal jugular venous pulsations and no hepatojugular reflux; brisk carotid pulses without delay and no carotid bruits Chest: clear to auscultation, no signs of consolidation by percussion or palpation, normal fremitus, symmetrical and full respiratory excursions Cardiovascular: normal position and quality of the apical impulse, regular rhythm, normal first and paradoxically split second heart sounds, no murmurs, rubs or gallops Abdomen: no tenderness or distention, no  masses by palpation, no abnormal pulsatility or arterial bruits, normal bowel sounds, no hepatosplenomegaly Extremities: no clubbing, cyanosis or edema; 2+ radial, ulnar and brachial pulses bilaterally; 2+ right femoral, posterior tibial and dorsalis pedis pulses; 2+ left femoral, posterior tibial and dorsalis pedis pulses; no subclavian or femoral bruits Neurological: grossly nonfocal Psych: Normal mood and affect     Wt Readings from Last 3 Encounters:  10/31/17 144 lb (65.3 kg)  07/25/17 141 lb 6.4 oz (64.1 kg)  04/18/17 142 lb (64.4 kg)      Studies/Labs Reviewed:   EKG:  EKG is ordered today.  It shows atrial paced, ventricular sensed rhythm with old left bundle branch block (QRS 128 ms), QTC 489 ms  Recent Labs: 04/07/2017: B Natriuretic Peptide 748.3; Hemoglobin 13.5; Platelets 248 10/31/2017: ALT 20; BUN 22; Creatinine, Ser 1.12; Potassium 4.4; Sodium 142   Lipid Panel    Component Value Date/Time   CHOL 170 10/31/2017 1014   TRIG 109 10/31/2017 1014   HDL 61 10/31/2017 1014   CHOLHDL 2.8 10/31/2017 1014   CHOLHDL 2.5 09/01/2015 0954   VLDL 25 09/01/2015 0954   LDLCALC 87 10/31/2017 1014    ASSESSMENT:    1. Dyslipidemia   2. Chronic diastolic heart failure (HCC)   3. PAF (paroxysmal atrial fibrillation) (HCC)   4. Encounter for monitoring sotalol therapy   5. Long term current use of anticoagulant   6. SSS (sick sinus syndrome) (HCC)   7. Pacemaker   8. Essential hypertension   9. LBBB (left bundle branch block)   10. Medication management      PLAN:  In order of problems listed above:  1. CHF: Left ventricular systolic function has returned to previous baseline of 50%.  EF was down to 20-25% on the echo in April, due to tachycardia cardiomyopathy.  Only takes the loop diuretic intermittently and I suspect we can stop this altogether.  On ARB, sotalol Sayres as a beta-blocker.  She does not require CRT at this time, but this is an option in the future if  LV function deteriorates. 2. AFib: She is never unaware of the episodes of arrhythmia.  Hence she developed tachycardia cardiomyopathy following a prolonged episode of sustained atrial fibrillation rapid ventricular response.  She is on appropriate anticoagulation.  CHADSVasc 4-5 (age, gender, HTN, CHF, possible CAD). 3. Sotalol: This has generally worked well for her for many years, with the exception of the 45-month episode of persistent atrial fibrillation earlier this year.  QT is  in the acceptable range.  Reminded her of the risks of drug interactions with this medication and the need for periodic monitoring of electrolytes and kidney function. 4. Savaysa: No bleeding complications.  Compliant with anticoagulation. 5. SSS: She has virtually 100% atrial pacing.  The current sensor settings appear to provide satisfactory treatment of chronotropic incompetence both clinically and by review of her heart rate histograms. 6. PPM: She has a left bundle branch block but has never requires ventricular pacing.  Continue remote downloads every 3 months.  We will see her in the office every 6 months for sotalol monitoring. 7. HLP: Has not tolerated higher doses of Livalo or any other statins.  LDL under 100 is probably acceptable.  No clear evidence of vascular problems. 8. HTN: Excellent control   Medication Adjustments/Labs and Tests Ordered: Current medicines are reviewed at length with the patient today.  Concerns regarding medicines are outlined above.  Medication changes, Labs and Tests ordered today are listed in the Patient Instructions below. Patient Instructions  Medication Instructions:  Dr Royann Shivers recommends that you continue on your current medications as directed. Please refer to the Current Medication list given to you today.  If you need a refill on your cardiac medications before your next appointment, please call your pharmacy.   Lab work: Your physician recommends that you return for  lab work TODAY.  If you have labs (blood work) drawn today and your tests are completely normal, you will receive your results only by: Marland Kitchen MyChart Message (if you have MyChart) OR . A paper copy in the mail If you have any lab test that is abnormal or we need to change your treatment, we will call you to review the results.  Testing/Procedures: Remote monitoring is used to monitor your Pacemaker of ICD from home. This monitoring reduces the number of office visits required to check your device to one time per year. It allows [place] to keep an eye on the functioning of your device to ensure it is working properly. You are scheduled for a device check from home on Monday, November 11th, 2019. You may send your transmission at any time that day. If you have a wireless device, the transmission will be sent automatically. After your physician reviews your transmission, you will receive a postcard with your next transmission date.  Follow-Up: At [facility], you and your health needs are our priority.  As part of our continuing mission to provide you with exceptional heart care, we have created designated Provider Care Teams.  These Care Teams include your primary Cardiologist (physician) and Advanced Practice Providers (APPs -  Physician Assistants and Nurse Practitioners) who all work together to provide you with the care you need, when you need it. You will need a follow up appointment in 6 months.  Please call our office 2 months in advance to schedule this appointment.  You may see Thurmon Fair, MD or one of the following Advanced Practice Providers on your designated Care Team: [place], [place] . Micah Flesher, PA-C    Signed, Thurmon Fair, MD  10/31/2017 7:15 PM    [facility] [address] [place], [place], [place]  [address] Phone: [phone]; Fax: [phone]

## 2017-10-31 NOTE — Patient Instructions (Signed)
Medication Instructions:  Dr Royann Shivers recommends that you continue on your current medications as directed. Please refer to the Current Medication list given to you today.  If you need a refill on your cardiac medications before your next appointment, please call your pharmacy.   Lab work: Your physician recommends that you return for lab work TODAY.  If you have labs (blood work) drawn today and your tests are completely normal, you will receive your results only by: Marland Kitchen MyChart Message (if you have MyChart) OR . A paper copy in the mail If you have any lab test that is abnormal or we need to change your treatment, we will call you to review the results.  Testing/Procedures: Remote monitoring is used to monitor your Pacemaker of ICD from home. This monitoring reduces the number of office visits required to check your device to one time per year. It allows [place] to keep an eye on the functioning of your device to ensure it is working properly. You are scheduled for a device check from home on Monday, November 11th, 2019. You may send your transmission at any time that day. If you have a wireless device, the transmission will be sent automatically. After your physician reviews your transmission, you will receive a postcard with your next transmission date.  Follow-Up: At [facility], you and your health needs are our priority.  As part of our continuing mission to provide you with exceptional heart care, we have created designated Provider Care Teams.  These Care Teams include your primary Cardiologist (physician) and Advanced Practice Providers (APPs -  Physician Assistants and Nurse Practitioners) who all work together to provide you with the care you need, when you need it. You will need a follow up appointment in 6 months.  Please call our office 2 months in advance to schedule this appointment.  You may see Thurmon Fair, MD or one of the following Advanced Practice Providers on your designated  Care Team: [place], [place] . Micah Flesher, PA-C

## 2017-11-06 ENCOUNTER — Telehealth: Payer: Self-pay

## 2017-11-06 NOTE — Telephone Encounter (Signed)
-----   Message from Thurmon Fair, MD sent at 10/31/2017  7:15 PM EDT ----- Please let her know that I do not think she needs to take the furosemide at all anymore.  Have her discontinue it, but let us know if she develops edema or shortness of breath. MCr

## 2017-11-06 NOTE — Telephone Encounter (Signed)
Patient notified of recommendations. 

## 2017-11-15 ENCOUNTER — Ambulatory Visit
Admission: RE | Admit: 2017-11-15 | Discharge: 2017-11-15 | Disposition: A | Discharge status: Home / Self Care | Payer: Medicare Other | Source: Ambulatory Visit | Attending: *Deleted | Admitting: *Deleted

## 2017-11-15 DIAGNOSIS — Z1231 Encounter for screening mammogram for malignant neoplasm of breast: Secondary | ICD-10-CM

## 2017-11-18 ENCOUNTER — Telehealth: Payer: Self-pay | Admitting: Cardiology

## 2017-11-18 ENCOUNTER — Ambulatory Visit (INDEPENDENT_AMBULATORY_CARE_PROVIDER_SITE_OTHER): Payer: Medicare Other | Admitting: *Deleted

## 2017-11-18 DIAGNOSIS — I495 Sick sinus syndrome: Secondary | ICD-10-CM

## 2017-11-18 DIAGNOSIS — I5032 Chronic diastolic (congestive) heart failure: Secondary | ICD-10-CM

## 2017-11-18 NOTE — Telephone Encounter (Signed)
LMOVM reminding pt to send remote transmission.   

## 2017-11-19 NOTE — Progress Notes (Signed)
Remote pacemaker transmission.   

## 2018-01-10 LAB — CUP PACEART REMOTE DEVICE CHECK
Brady Statistic AP VP Percent: 0.09 %
Brady Statistic AP VS Percent: 57.27 %
Brady Statistic AS VP Percent: 3.09 %
Brady Statistic RA Percent Paced: 46.41 %
Brady Statistic RV Percent Paced: 3.45 %
Date Time Interrogation Session: 20191111221258
Implantable Lead Implant Date: 20141219
Implantable Lead Implant Date: 20141219
Implantable Lead Location: 753860
Implantable Lead Model: 5076
Implantable Lead Model: 5076
Lead Channel Impedance Value: 361 Ohm
Lead Channel Impedance Value: 456 Ohm
Lead Channel Pacing Threshold Pulse Width: 0.4 ms
Lead Channel Pacing Threshold Pulse Width: 0.4 ms
Lead Channel Sensing Intrinsic Amplitude: 3.75 mV
Lead Channel Sensing Intrinsic Amplitude: 3.75 mV
Lead Channel Sensing Intrinsic Amplitude: 5.875 mV
Lead Channel Setting Pacing Amplitude: 2 V
Lead Channel Setting Pacing Pulse Width: 0.4 ms
MDC IDC LEAD LOCATION: 753859
MDC IDC MSMT BATTERY REMAINING LONGEVITY: 60 mo
MDC IDC MSMT BATTERY VOLTAGE: 3 V
MDC IDC MSMT LEADCHNL RA IMPEDANCE VALUE: 399 Ohm
MDC IDC MSMT LEADCHNL RA PACING THRESHOLD AMPLITUDE: 0.625 V
MDC IDC MSMT LEADCHNL RV IMPEDANCE VALUE: 380 Ohm
MDC IDC MSMT LEADCHNL RV PACING THRESHOLD AMPLITUDE: 0.75 V
MDC IDC MSMT LEADCHNL RV SENSING INTR AMPL: 5.875 mV
MDC IDC PG IMPLANT DT: 20141219
MDC IDC SET LEADCHNL RV PACING AMPLITUDE: 2.5 V
MDC IDC SET LEADCHNL RV SENSING SENSITIVITY: 0.9 mV
MDC IDC STAT BRADY AS VS PERCENT: 39.55 %

## 2018-01-14 ENCOUNTER — Telehealth: Payer: Self-pay | Admitting: *Deleted

## 2018-01-14 NOTE — Telephone Encounter (Signed)
-----   Message from Thurmon Fair, MD sent at 01/14/2018  3:59 PM EST ----- Remote reviewed.   Not pacemaker dependent. Battery status is good. Lead measurements are stable. Heart rate histogram is favorable. Persistent atrial fibrillation with inadequate rate control.  She has a history of tachycardia related cardiomyopathy.  Irving Burton, Can we please take another look today and see if she is still in AF? (I already called her and asked her to do an additional transmission).

## 2018-01-14 NOTE — Telephone Encounter (Signed)
Presenting EGM as of 01/14/18 at 1731 shows Ap/Vs @ 79bpm. No AT/AF episodes since last transmission.

## 2018-01-15 NOTE — Telephone Encounter (Signed)
Thanks, Irving Burton! Chelley, please let her know rhythm is OK. The episode of AF resolved spontaneously. MCr

## 2018-01-15 NOTE — Telephone Encounter (Signed)
Spoke w/ pt and informed her that MD said the AF episode resolved spontaneously. Pt verbalized understanding.

## 2018-02-07 LAB — CUP PACEART INCLINIC DEVICE CHECK
Date Time Interrogation Session: 20200131145718
Implantable Lead Implant Date: 20141219
Implantable Lead Location: 753860
Implantable Lead Model: 5076
Implantable Pulse Generator Implant Date: 20141219
MDC IDC LEAD IMPLANT DT: 20141219
MDC IDC LEAD LOCATION: 753859

## 2018-02-24 ENCOUNTER — Other Ambulatory Visit: Payer: Self-pay | Admitting: Physician Assistant

## 2018-02-28 ENCOUNTER — Ambulatory Visit (INDEPENDENT_AMBULATORY_CARE_PROVIDER_SITE_OTHER): Payer: Medicare Other

## 2018-02-28 DIAGNOSIS — I495 Sick sinus syndrome: Secondary | ICD-10-CM

## 2018-03-01 LAB — CUP PACEART REMOTE DEVICE CHECK
Battery Voltage: 2.99 V
Brady Statistic AP VP Percent: 0.06 %
Brady Statistic AS VP Percent: 1.72 %
Brady Statistic AS VS Percent: 21.22 %
Brady Statistic RA Percent Paced: 68.38 %
Brady Statistic RV Percent Paced: 1.99 %
Implantable Lead Location: 753860
Implantable Lead Model: 5076
Implantable Lead Model: 5076
Lead Channel Impedance Value: 380 Ohm
Lead Channel Impedance Value: 399 Ohm
Lead Channel Impedance Value: 418 Ohm
Lead Channel Pacing Threshold Amplitude: 0.625 V
Lead Channel Pacing Threshold Pulse Width: 0.4 ms
Lead Channel Pacing Threshold Pulse Width: 0.4 ms
Lead Channel Sensing Intrinsic Amplitude: 3.5 mV
Lead Channel Sensing Intrinsic Amplitude: 8.5 mV
Lead Channel Sensing Intrinsic Amplitude: 8.5 mV
Lead Channel Setting Pacing Amplitude: 2 V
Lead Channel Setting Pacing Pulse Width: 0.4 ms
MDC IDC LEAD IMPLANT DT: 20141219
MDC IDC LEAD IMPLANT DT: 20141219
MDC IDC LEAD LOCATION: 753859
MDC IDC MSMT BATTERY REMAINING LONGEVITY: 52 mo
MDC IDC MSMT LEADCHNL RA PACING THRESHOLD AMPLITUDE: 0.625 V
MDC IDC MSMT LEADCHNL RA SENSING INTR AMPL: 3.5 mV
MDC IDC MSMT LEADCHNL RV IMPEDANCE VALUE: 475 Ohm
MDC IDC PG IMPLANT DT: 20141219
MDC IDC SESS DTM: 20200220221622
MDC IDC SET LEADCHNL RV PACING AMPLITUDE: 2.5 V
MDC IDC SET LEADCHNL RV SENSING SENSITIVITY: 0.9 mV
MDC IDC STAT BRADY AP VS PERCENT: 77.01 %

## 2018-03-03 ENCOUNTER — Other Ambulatory Visit: Payer: Self-pay

## 2018-03-03 MED ORDER — DILTIAZEM HCL ER COATED BEADS 120 MG PO CP24: 120.0000 mg | ORAL_CAPSULE | Freq: Every day | ORAL | 6 refills | Status: DC

## 2018-03-03 NOTE — Progress Notes (Signed)
diltiazem

## 2018-03-06 NOTE — Progress Notes (Signed)
Remote pacemaker transmission.   

## 2018-03-07 ENCOUNTER — Encounter: Payer: Self-pay | Admitting: Cardiology

## 2018-04-24 ENCOUNTER — Telehealth: Payer: Self-pay

## 2018-04-24 NOTE — Telephone Encounter (Signed)
Virtual Visit Pre-Appointment Phone Call  Steps For Call:  1. Confirm consent - "In the setting of the current Covid19 crisis, you are scheduled for a (phone or video) visit with your provider on (04/30/18) at (10:40AM).  Just as we do with many in-office visits, in order for you to participate in this visit, we must obtain consent.  If you'd like, I can send this to your mychart (if signed up) or email for you to review.  Otherwise, I can obtain your verbal consent now.  All virtual visits are billed to your insurance company just like a normal visit would be.  By agreeing to a virtual visit, we'd like you to understand that the technology does not allow for your provider to perform an examination, and thus may limit your provider's ability to fully assess your condition.  Finally, though the technology is pretty good, we cannot assure that it will always work on either your or our end, and in the setting of a video visit, we may have to convert it to a phone-only visit.  In either situation, we cannot ensure that we have a secure connection.  Are you willing to proceed?" STAFF: Did the patient verbally acknowledge consent to telehealth visit? Document YES/NO here: YES  2. Confirm the BEST phone number to call the day of the visit by including in appointment notes  3. Give patient instructions for WebEx/MyChart download to smartphone as below or Doximity/Doxy.me if video visit (depending on what platform provider is using)  4. Advise patient to be prepared with their blood pressure, heart rate, weight, any heart rhythm information, their current medicines, and a piece of paper and pen handy for any instructions they may receive the day of their visit  5. Inform patient they will receive a phone call 15 minutes prior to their appointment time (may be from unknown caller ID) so they should be prepared to answer  6. Confirm that appointment type is correct in Epic appointment notes (VIDEO vs PHONE)      TELEPHONE CALL NOTE  Monica Harvey has been deemed a candidate for a follow-up tele-health visit to limit community exposure during the Covid-19 pandemic. I spoke with the patient via phone to ensure availability of phone/video source, confirm preferred email & phone number, and discuss instructions and expectations.  I reminded OMNI STROHMAIER to be prepared with any vital sign and/or heart rhythm information that could potentially be obtained via home monitoring, at the time of her visit. I reminded JENNIKA ROSENSTIEL to expect a phone call at the time of her visit if her visit.  Adline Peals, CMA 04/24/2018 9:44 AM   INSTRUCTIONS FOR DOWNLOADING THE WEBEX APP TO SMARTPHONE  - If Apple, ask patient to go to [employer] and type in WebEx in the search bar. Download [employer], the blue/green circle. If Android, go to [employer] and type in [employer] in the search bar. The app is free but as with any other app downloads, their phone may require them to verify saved payment information or Apple/Android password.  - The patient does NOT have to create an account. - On the day of the visit, the assist will walk the patient through joining the meeting with the meeting number/password.  INSTRUCTIONS FOR DOWNLOADING THE MYCHART APP TO SMARTPHONE  - The patient must first make sure to have activated MyChart and know their login information - If Apple, go to [employer] and type  in [place] in the search bar and download the app. If Android, ask patient to go to [employer] and type in [place] in the search bar and download the app. The app is free but as with any other app downloads, their phone may require them to verify saved payment information or Apple/Android password.  - The patient will need to then log into the app with their MyChart username and password, and select [facility] as their healthcare provider to link the account. When it is time for your visit, go to the  MyChart app, find appointments, and click Begin Video Visit. Be sure to Select Allow for your device to access the Microphone and Camera for your visit. You will then be connected, and your provider will be with you shortly.  **If they have any issues connecting, or need assistance please contact MyChart service desk [phone])**  **If using a computer, in order to ensure the best quality for their visit they will need to use either of the following Internet Browsers: [employer], or Google Chrome**  IF USING DOXIMITY or DOXY.ME - The patient will receive a link just prior to their visit, either by text or email (to be determined day of appointment depending on if it's doxy.me or Doximity).     FULL LENGTH CONSENT FOR TELE-HEALTH VISIT   I hereby voluntarily request, consent and authorize [facility] and its employed or contracted physicians, physician assistants, nurse practitioners or other licensed health care professionals (the Practitioner), to provide me with telemedicine health care services (the "Services") as deemed necessary by the treating Practitioner. I acknowledge and consent to receive the Services by the Practitioner via telemedicine. I understand that the telemedicine visit will involve communicating with the Practitioner through live audiovisual communication technology and the disclosure of certain medical information by electronic transmission. I acknowledge that I have been given the opportunity to request an in-person assessment or other available alternative prior to the telemedicine visit and am voluntarily participating in the telemedicine visit.  I understand that I have the right to withhold or withdraw my consent to the use of telemedicine in the course of my care at any time, without affecting my right to future care or treatment, and that the Practitioner or I may terminate the telemedicine visit at any time. I understand that I have the right to  inspect all information obtained and/or recorded in the course of the telemedicine visit and may receive copies of available information for a reasonable fee.  I understand that some of the potential risks of receiving the Services via telemedicine include:  Marland Kitchen Delay or interruption in medical evaluation due to technological equipment failure or disruption; . Information transmitted may not be sufficient (e.g. poor resolution of images) to allow for appropriate medical decision making by the Practitioner; and/or  . In rare instances, security protocols could fail, causing a breach of personal health information.  Furthermore, I acknowledge that it is my responsibility to provide information about my medical history, conditions and care that is complete and accurate to the best of my ability. I acknowledge that Practitioner's advice, recommendations, and/or decision may be based on factors not within their control, such as incomplete or inaccurate data provided by me or distortions of diagnostic images or specimens that may result from electronic transmissions. I understand that the practice of medicine is not an exact science and that Practitioner makes no warranties or guarantees regarding treatment outcomes. I acknowledge that I will receive a  copy of this consent concurrently upon execution via email to the email address I last provided but may also request a printed copy by calling the office of [facility].    I understand that my insurance will be billed for this visit.   I have read or had this consent read to me. . I understand the contents of this consent, which adequately explains the benefits and risks of the Services being provided via telemedicine.  . I have been provided ample opportunity to ask questions regarding this consent and the Services and have had my questions answered to my satisfaction. . I give my informed consent for the services to be provided through the use of  telemedicine in my medical care  By participating in this telemedicine visit I agree to the above.

## 2018-04-29 ENCOUNTER — Telehealth: Payer: Self-pay | Admitting: Cardiovascular Disease

## 2018-04-29 NOTE — Telephone Encounter (Signed)
LVM for pre reg °

## 2018-04-29 NOTE — Telephone Encounter (Signed)
Home phone only/ my chart via email/ virtual consent/ pre reg completed

## 2018-04-30 ENCOUNTER — Telehealth (INDEPENDENT_AMBULATORY_CARE_PROVIDER_SITE_OTHER): Payer: Medicare Other | Admitting: Cardiovascular Disease

## 2018-04-30 ENCOUNTER — Encounter: Payer: Self-pay | Admitting: Cardiovascular Disease

## 2018-04-30 DIAGNOSIS — I110 Hypertensive heart disease with heart failure: Secondary | ICD-10-CM

## 2018-04-30 DIAGNOSIS — E7800 Pure hypercholesterolemia, unspecified: Secondary | ICD-10-CM

## 2018-04-30 DIAGNOSIS — I4891 Unspecified atrial fibrillation: Secondary | ICD-10-CM | POA: Diagnosis not present

## 2018-04-30 DIAGNOSIS — I480 Paroxysmal atrial fibrillation: Secondary | ICD-10-CM

## 2018-04-30 DIAGNOSIS — I5042 Chronic combined systolic (congestive) and diastolic (congestive) heart failure: Secondary | ICD-10-CM | POA: Diagnosis not present

## 2018-04-30 DIAGNOSIS — Z7901 Long term (current) use of anticoagulants: Secondary | ICD-10-CM

## 2018-04-30 DIAGNOSIS — I447 Left bundle-branch block, unspecified: Secondary | ICD-10-CM

## 2018-04-30 DIAGNOSIS — Z950 Presence of cardiac pacemaker: Secondary | ICD-10-CM

## 2018-04-30 DIAGNOSIS — I495 Sick sinus syndrome: Secondary | ICD-10-CM

## 2018-04-30 DIAGNOSIS — I10 Essential (primary) hypertension: Secondary | ICD-10-CM

## 2018-04-30 NOTE — Progress Notes (Signed)
Virtual Visit via Telephone Note   This visit type was conducted due to national recommendations for restrictions regarding the COVID-19 Pandemic (e.g. social distancing) in an effort to limit this patient's exposure and mitigate transmission in our community.  Due to her co-morbid illnesses, this patient is at least at moderate risk for complications without adequate follow up.  This format is felt to be most appropriate for this patient at this time.  The patient did not have access to video technology/had technical difficulties with video requiring transitioning to audio format only (telephone).  All issues noted in this document were discussed and addressed.  No physical exam could be performed with this format.  Please refer to the patient's chart for her  consent to telehealth for [facility].   Evaluation Performed:  Follow-up visit  Date:  04/30/2018   ID:  Monica Harvey, DOB 1940-05-30, MRN [id number]  Patient Location: Home Provider Location: Home  PCP:  Marva Panda, NP  Cardiologist:  Thurmon Fair, MD  Electrophysiologist:  None   Chief Complaint: Atrial fibrillation, CHF, pacemaker follow-up  History of Present Illness:    Monica Harvey is a 78 y.o. female with tachycardia-bradycardia syndrome, dual-chamber permanent pacemaker (Medtronic) and a history of chronic systolic and diastolic heart failure due to tachycardia related cardiomyopathy.  Her previous pacemaker download showed that during her episodes of atrial fibrillation she still had rapid ventricular response so diltiazem was added.  Since then, she reports feeling better.  She denies dizziness or other symptoms of hypotension.  She denies exertional dyspnea, orthopnea, PND or leg edema.  She has not had problems with chest pain.  She is never been aware of palpitations.  She denies any focal neurological events.  She has not had any bleeding problems, falls, injuries and is compliant with  anticoagulation with Savaysa.  She did another pacemaker download just yesterday and this shows that in the last month she has not had any atrial fibrillation.  Battery longevity is estimated at approximately 4.5 years.  Lead parameters are normal.  She has 99.5% atrial pacing virtually no ventricular pacing.  About a year ago she had heart failure exacerbation due to persistent atrial fibrillation rapid ventricular response and her left ventricular ejection fraction dropped to 20-25%.  This was the second time that she has had tachycardia cardiomyopathy.  After arrhythmia control her ejection fraction increased to 45-50% by an echo performed in July 2019.  She has not undergone cardiac catheterization.  She had a normal nuclear stress test in 2011.  The patient does not have symptoms concerning for COVID-19 infection (fever, chills, cough, or new shortness of breath).  She reports that her 95 year oldmother-in-law is currently hospitalized with COVID-19, but she has not been in contact with her in weeks.   Past Medical History:  Diagnosis Date   Anginal pain (HCC)    Chronic anticoagulation, CHAD2Vasc score 4 02/09/2014   Chronic diastolic CHF (congestive heart failure) (HCC)    a. 01/2014 Echo: EF 50-55%, Gr1 DD, mild AI/MR.   First degree atrioventricular block    GERD (gastroesophageal reflux disease)    Heart murmur    HTN (hypertension)    Hyperlipidemia    LBBB (left bundle branch block)    PAF (paroxysmal atrial fibrillation) (HCC)    a. Previously on flecainide;  b. 01/2014 sotalol loaded and dccv performed;  c. Chronic Savaysa Rx.   Presence of permanent cardiac pacemaker    a. 12/26/12 s/p MDT DC PPM.  SSS (sick sinus syndrome) (HCC)    a. s/p PPM-Medtronic placed 12/26/12    Symptomatic sinus bradycardia    Past Surgical History:  Procedure Laterality Date   APPENDECTOMY  1971   BREAST EXCISIONAL BIOPSY Right    BREAST LUMPECTOMY Right 1980's?   "benign"    CARDIOVERSION N/A 01/22/2014   Procedure: CARDIOVERSION;  Surgeon: Chrystie Nose, MD;  Location: [facility] ENDOSCOPY;  Service: Cardiovascular;  Laterality: N/A;   CARDIOVERSION N/A 04/10/2017   Procedure: CARDIOVERSION;  Surgeon: Jake Bathe, MD;  Location: [facility] ENDOSCOPY;  Service: Cardiovascular;  Laterality: N/A;   INSERT / REPLACE / REMOVE PACEMAKER  12/26/2012   Medtronic, Dr. Royann Shivers   NM MYOCAR PERF WALL MOTION  08/05/2009   small to mod. reversible anteroseptal,septal,& apical perfusion defect. EF 59%.   PERMANENT PACEMAKER INSERTION N/A 12/26/2012   Procedure: PERMANENT PACEMAKER INSERTION;  Surgeon: Thurmon Fair, MD;  Location: [facility] CATH LAB;  Service: Cardiovascular;  Laterality: N/A;   TONSILLECTOMY AND ADENOIDECTOMY  1960's   TUBAL LIGATION  1971   VAGINAL HYSTERECTOMY  1980's     Current Meds  Medication Sig   acetaminophen (TYLENOL) 325 MG tablet Take 1-2 tablets (325-650 mg total) by mouth every 4 (four) hours as needed for mild pain. (Patient taking differently: Take 500 mg by mouth every 4 (four) hours as needed for mild pain. )   diltiazem (CARDIZEM CD) 120 MG 24 hr capsule Take 1 capsule (120 mg total) by mouth daily.   irbesartan (AVAPRO) 150 MG tablet TAKE 1 TABLET DAILY   ondansetron (ZOFRAN ODT) 4 MG disintegrating tablet Take 1 tablet (4 mg total) by mouth every 8 (eight) hours as needed for nausea or vomiting.   Pitavastatin Calcium (LIVALO) 1 MG TABS Take 1 tablet (1 mg total) by mouth daily.   SAVAYSA 30 MG TABS tablet TAKE 1 TABLET DAILY. MAKE  AN APPOINTMENT FOR FURTHER REFILLS   sotalol (BETAPACE) 120 MG tablet Take 1 tablet (120 mg total) by mouth every 12 (twelve) hours.     Allergies:   Codeine   Social History   Tobacco Use   Smoking status: Never Smoker   Smokeless tobacco: Never Used  Substance Use Topics   Alcohol use: No   Drug use: No     Family Hx: The patient's family history includes Heart attack in her mother; Stroke in  her sister.  ROS:   Please see the history of present illness.     All other systems reviewed and are negative.   Prior CV studies:   The following studies were reviewed today:  Comprehensive pacemaker download from April 21, most recent echocardiogram from August 05, 2017  Labs/Other Tests and Data Reviewed:    EKG:  An ECG dated 10/31/2017 was personally reviewed today and demonstrated:  Atrial paced, ventricular sensed rhythm, left bundle branch block (QRS 128 ms), QTC 489 ms  Recent Labs: 10/31/2017: ALT 20; BUN 22; Creatinine, Ser 1.12; Potassium 4.4; Sodium 142   Recent Lipid Panel Lab Results  Component Value Date/Time   CHOL 170 10/31/2017 10:14 AM   TRIG 109 10/31/2017 10:14 AM   HDL 61 10/31/2017 10:14 AM   CHOLHDL 2.8 10/31/2017 10:14 AM   CHOLHDL 2.5 09/01/2015 09:54 AM   LDLCALC 87 10/31/2017 10:14 AM    Wt Readings from Last 3 Encounters:  04/30/18 152 lb (68.9 kg)  10/31/17 144 lb (65.3 kg)  07/25/17 141 lb 6.4 oz (64.1 kg)     Objective:  Vital Signs:  BP 95/69    Pulse 63    Ht  (1.6 m)    Wt 152 lb (68.9 kg)    BMI 26.93 kg/m    VITAL SIGNS:  reviewed Unable to examine  ASSESSMENT & PLAN:    1. CHF: She denies any signs or symptoms of hypervolemia.  She appears to have NYHA functional class I.  I suspect that if we were to look again we will see that her left ventricular systolic function has returned to normal.  This is not urgent and I would put off her echocardiogram until later in the year when the coronavirus restrictions have been lifted. 2. AFib: She is not aware of being palpitations even when she has rapid ventricular rates.  Currently in normal rhythm for the last couple of months.  Rate control improved after addition of diltiazem, but cannot increase the dose any further due to low blood pressure.  Thankfully she has no symptoms of hypotension.  CHADSVasc 4-5 (age, gender, HTN, CHF, possible CAD). 3. Sotalol: Ideally we would  performing an ECG today to measure the QT interval since she is on treatment with sotalol, but we will delay this until it is safe for her to have face-to-face appointments.  Reviewed the importance of screening for potential drug interactions with any new prescriptions.  She is appropriately anticoagulated.  Normal renal function and potassium level on labs performed in October.  We will also repeat labs at her next office appointment. 4. Savaysa: No bleeding complications 5. SSS: Heart rate histogram suggests appropriate rate response sensors settings; she has virtually 100% atrial pacing 6. LBBB: Longstanding conduction abnormality, but never requires ventricular pacing. 7. PM: Normal device function.  Remote download in 3 months and office visit in 6 months. 8. HTN: Borderline low blood pressure today, but asymptomatic. 9. HLP: Fair LDL cholesterol on current dose of Livalo.  She has not tolerated more potent statins or higher doses of Livalo.  No clear confirmation of vascular disease/CAD.  LDL less than 100 is acceptable.  COVID-19 Education: The signs and symptoms of COVID-19 were discussed with the patient and how to seek care for testing (follow up with PCP or arrange E-visit).  The importance of social distancing was discussed today.  Time:   Today, I have spent 17 minutes with the patient with telehealth technology discussing the above problems.     Medication Adjustments/Labs and Tests Ordered: Current medicines are reviewed at length with the patient today.  Concerns regarding medicines are outlined above.   Tests Ordered: No orders of the defined types were placed in this encounter.   Medication Changes: No orders of the defined types were placed in this encounter.   Disposition:  Follow up In the office in 6 months.  Plan echo, labs before that visit.  Signed, Thurmon Fair, MD  04/30/2018 11:02 AM    [facility]

## 2018-04-30 NOTE — Patient Instructions (Signed)
Medication Instructions:  Continue same medications If you need a refill on your cardiac medications before your next appointment, please call your pharmacy.   Lab work: Lab ( cmet,lipid panel ) to be done 2 weeks before 6 month appointment with Dr.Croitoru you can have done same day you have echo done at our [facility]. Remember nothing to eat or drink    Testing/Procedures: Echo to be scheduled 2 weeks before 6 month follow up with Dr.Croitoru  Follow-Up: At [facility], you and your health needs are our priority.  As part of our continuing mission to provide you with exceptional heart care, we have created designated Provider Care Teams.  These Care Teams include your primary Cardiologist (physician) and Advanced Practice Providers (APPs -  Physician Assistants and Nurse Practitioners) who all work together to provide you with the care you need, when you need it. . Schedule 6 month follow up with Dr.Croitoru  Call 3 months before to schedule  Let Scheduler know you will need to schedule Echo and lab work 2 weeks before appointment

## 2018-05-07 ENCOUNTER — Other Ambulatory Visit: Payer: Self-pay | Admitting: Physician Assistant

## 2018-05-30 ENCOUNTER — Telehealth: Payer: Self-pay

## 2018-05-30 ENCOUNTER — Encounter: Payer: Medicare Other | Admitting: *Deleted

## 2018-05-30 NOTE — Telephone Encounter (Signed)
Spoke with patient to remind of missed remote transmission 

## 2018-06-03 ENCOUNTER — Telehealth: Payer: Self-pay | Admitting: Student

## 2018-06-03 ENCOUNTER — Ambulatory Visit (INDEPENDENT_AMBULATORY_CARE_PROVIDER_SITE_OTHER): Payer: Medicare Other | Admitting: *Deleted

## 2018-06-03 DIAGNOSIS — I4891 Unspecified atrial fibrillation: Secondary | ICD-10-CM

## 2018-06-03 DIAGNOSIS — I495 Sick sinus syndrome: Secondary | ICD-10-CM

## 2018-06-03 LAB — CUP PACEART REMOTE DEVICE CHECK
Battery Remaining Longevity: 59 mo
Battery Voltage: 2.98 V
Brady Statistic AP VP Percent: 0.08 %
Brady Statistic AP VS Percent: 89.82 %
Brady Statistic AS VP Percent: 3.9 %
Brady Statistic AS VS Percent: 6.2 %
Brady Statistic RA Percent Paced: 85.71 %
Brady Statistic RV Percent Paced: 4.2 %
Date Time Interrogation Session: 20200525132459
Implantable Lead Implant Date: 20141219
Implantable Lead Implant Date: 20141219
Implantable Lead Location: 753859
Implantable Lead Location: 753860
Implantable Lead Model: 5076
Implantable Lead Model: 5076
Implantable Pulse Generator Implant Date: 20141219
Lead Channel Impedance Value: 361 Ohm
Lead Channel Impedance Value: 380 Ohm
Lead Channel Impedance Value: 399 Ohm
Lead Channel Impedance Value: 456 Ohm
Lead Channel Pacing Threshold Amplitude: 0.625 V
Lead Channel Pacing Threshold Amplitude: 0.75 V
Lead Channel Pacing Threshold Pulse Width: 0.4 ms
Lead Channel Pacing Threshold Pulse Width: 0.4 ms
Lead Channel Sensing Intrinsic Amplitude: 0.5 mV
Lead Channel Sensing Intrinsic Amplitude: 0.5 mV
Lead Channel Sensing Intrinsic Amplitude: 6.875 mV
Lead Channel Sensing Intrinsic Amplitude: 6.875 mV
Lead Channel Setting Pacing Amplitude: 2 V
Lead Channel Setting Pacing Amplitude: 2.5 V
Lead Channel Setting Pacing Pulse Width: 0.4 ms
Lead Channel Setting Sensing Sensitivity: 0.9 mV

## 2018-06-03 NOTE — Telephone Encounter (Signed)
Discussed alert with patient. Pt has had palpitations and increased fatigue since being in atrial fibrillation these past several days. Per Dr. Salena Saner, if symptomatic, will need visit (telehealth or otherwise).   Will forward for scheduling.    Casimiro Needle [address]" [place], PA-C 06/03/2018 1:28 PM

## 2018-06-03 NOTE — Telephone Encounter (Signed)
Called to discuss Remote device check, which shows Afib for past 2-3 days. LMOM to call back.

## 2018-06-03 NOTE — Telephone Encounter (Signed)
Patient return your call. You can call her at [phone] or [phone].

## 2018-06-04 ENCOUNTER — Telehealth: Payer: Self-pay | Admitting: Cardiovascular Disease

## 2018-06-04 NOTE — Telephone Encounter (Signed)
New message:    Patient calling too get a appt per PA Donnie Aho. Patient has some concerns about her medications and patient is in AFIB and states she is to see him in person. Please call [phone]

## 2018-06-04 NOTE — Telephone Encounter (Signed)
Graciella Freer, PA-C to Althea Grimmer K  @ 1:28 PM  Note  Discussed alert with patient. Pt has had palpitations and increased fatigue since being in atrial fibrillation these past several days. Per Dr. Salena Saner, if symptomatic, will need visit (telehealth or otherwise).   Will forward for scheduling.   Casimiro Needle [address] [place], [place] 06/03/2018 1:28 PM   Baldomero Lamy notifies she will callpt

## 2018-06-04 NOTE — Telephone Encounter (Signed)
Spoke with pt, she was upset no one had called her back. Follow up scheduled with dr c 06-09-2018 for telephone visit.

## 2018-06-04 NOTE — Telephone Encounter (Signed)
Encounter not needed

## 2018-06-05 ENCOUNTER — Telehealth: Payer: Self-pay | Admitting: Cardiovascular Disease

## 2018-06-05 NOTE — Telephone Encounter (Signed)
New Message ° ° ° °Pt is returning a call  ° ° °Please call back  °

## 2018-06-06 NOTE — Telephone Encounter (Signed)
Virtual Visit Pre-Appointment Phone Call  "(Name), I am calling you today to discuss your upcoming appointment. We are currently trying to limit exposure to the virus that causes COVID-19 by seeing patients at home rather than in the office."  1. "What is the BEST phone number to call the day of the visit?" - include this in appointment notes  2. "Do you have or have access to (through a family member/friend) a smartphone with video capability that we can use for your visit?" a. If yes - list this number in appt notes as "cell" (if different from BEST phone #) and list the appointment type as a VIDEO visit in appointment notes b. If no - list the appointment type as a PHONE visit in appointment notes  3. Confirm consent - "In the setting of the current Covid19 crisis, you are scheduled for a (phone or video) visit with your provider on (date) at (time).  Just as we do with many in-office visits, in order for you to participate in this visit, we must obtain consent.  If you'd like, I can send this to your mychart (if signed up) or email for you to review.  Otherwise, I can obtain your verbal consent now.  All virtual visits are billed to your insurance company just like a normal visit would be.  By agreeing to a virtual visit, we'd like you to understand that the technology does not allow for your provider to perform an examination, and thus may limit your provider's ability to fully assess your condition. If your provider identifies any concerns that need to be evaluated in person, we will make arrangements to do so.  Finally, though the technology is pretty good, we cannot assure that it will always work on either your or our end, and in the setting of a video visit, we may have to convert it to a phone-only visit.  In either situation, we cannot ensure that we have a secure connection.  Are you willing to proceed?" STAFF: Did the patient verbally acknowledge consent to telehealth visit? Document  YES/NO here: YES  4. Advise patient to be prepared - "Two hours prior to your appointment, go ahead and check your blood pressure, pulse, oxygen saturation, and your weight (if you have the equipment to check those) and write them all down. When your visit starts, your provider will ask you for this information. If you have an Apple Watch or Kardia device, please plan to have heart rate information ready on the day of your appointment. Please have a pen and paper handy nearby the day of the visit as well."  5. Give patient instructions for MyChart download to smartphone OR Doximity/Doxy.me as below if video visit (depending on what platform provider is using)  6. Inform patient they will receive a phone call 15 minutes prior to their appointment time (may be from unknown caller ID) so they should be prepared to answer    TELEPHONE CALL NOTE  TAMIRA CULHANE has been deemed a candidate for a follow-up tele-health visit to limit community exposure during the Covid-19 pandemic. I spoke with the patient via phone to ensure availability of phone/video source, confirm preferred email & phone number, and discuss instructions and expectations.  I reminded Monica Harvey to be prepared with any vital sign and/or heart rhythm information that could potentially be obtained via home monitoring, at the time of her visit. I reminded QUINLAN BOOM to expect a phone call prior to  her visit.  Raelyn Number, CMA 06/06/2018 2:52 PM   INSTRUCTIONS FOR DOWNLOADING THE MYCHART APP TO SMARTPHONE  - The patient must first make sure to have activated MyChart and know their login information - If Apple, go to [employer] and type in MyChart in the search bar and download the app. If Android, ask patient to go to [employer] and type in [place] in the search bar and download the app. The app is free but as with any other app downloads, their phone may require them to verify saved payment information or  Apple/Android password.  - The patient will need to then log into the app with their MyChart username and password, and select [facility] as their healthcare provider to link the account. When it is time for your visit, go to the MyChart app, find appointments, and click Begin Video Visit. Be sure to Select Allow for your device to access the Microphone and Camera for your visit. You will then be connected, and your provider will be with you shortly.  **If they have any issues connecting, or need assistance please contact MyChart service desk [phone])**  **If using a computer, in order to ensure the best quality for their visit they will need to use either of the following Internet Browsers: [employer] or Google Chrome**  IF USING DOXIMITY or DOXY.ME - The patient will receive a link just prior to their visit by text.     FULL LENGTH CONSENT FOR TELE-HEALTH VISIT   I hereby voluntarily request, consent and authorize [facility] and its employed or contracted physicians, physician assistants, nurse practitioners or other licensed health care professionals (the Practitioner), to provide me with telemedicine health care services (the "Services") as deemed necessary by the treating Practitioner. I acknowledge and consent to receive the Services by the Practitioner via telemedicine. I understand that the telemedicine visit will involve communicating with the Practitioner through live audiovisual communication technology and the disclosure of certain medical information by electronic transmission. I acknowledge that I have been given the opportunity to request an in-person assessment or other available alternative prior to the telemedicine visit and am voluntarily participating in the telemedicine visit.  I understand that I have the right to withhold or withdraw my consent to the use of telemedicine in the course of my care at any time, without affecting my right to future care  or treatment, and that the Practitioner or I may terminate the telemedicine visit at any time. I understand that I have the right to inspect all information obtained and/or recorded in the course of the telemedicine visit and may receive copies of available information for a reasonable fee.  I understand that some of the potential risks of receiving the Services via telemedicine include:  Marland Kitchen Delay or interruption in medical evaluation due to technological equipment failure or disruption; . Information transmitted may not be sufficient (e.g. poor resolution of images) to allow for appropriate medical decision making by the Practitioner; and/or  . In rare instances, security protocols could fail, causing a breach of personal health information.  Furthermore, I acknowledge that it is my responsibility to provide information about my medical history, conditions and care that is complete and accurate to the best of my ability. I acknowledge that Practitioner's advice, recommendations, and/or decision may be based on factors not within their control, such as incomplete or inaccurate data provided by me or distortions of diagnostic images or specimens that may result from electronic transmissions.  I understand that the practice of medicine is not an exact science and that Practitioner makes no warranties or guarantees regarding treatment outcomes. I acknowledge that I will receive a copy of this consent concurrently upon execution via email to the email address I last provided but may also request a printed copy by calling the office of [facility].    I understand that my insurance will be billed for this visit.   I have read or had this consent read to me. . I understand the contents of this consent, which adequately explains the benefits and risks of the Services being provided via telemedicine.  . I have been provided ample opportunity to ask questions regarding this consent and the Services and have had  my questions answered to my satisfaction. . I give my informed consent for the services to be provided through the use of telemedicine in my medical care  By participating in this telemedicine visit I agree to the above.

## 2018-06-09 ENCOUNTER — Ambulatory Visit: Payer: Medicare Other | Admitting: Cardiovascular Disease

## 2018-06-09 ENCOUNTER — Encounter: Payer: Self-pay | Admitting: Cardiovascular Disease

## 2018-06-09 ENCOUNTER — Telehealth (INDEPENDENT_AMBULATORY_CARE_PROVIDER_SITE_OTHER): Payer: Medicare Other | Admitting: Cardiovascular Disease

## 2018-06-09 ENCOUNTER — Telehealth: Payer: Self-pay | Admitting: *Deleted

## 2018-06-09 VITALS — BP 97/76 | HR 59 | Ht 63.0 in | Wt 156.0 lb

## 2018-06-09 DIAGNOSIS — I5042 Chronic combined systolic (congestive) and diastolic (congestive) heart failure: Secondary | ICD-10-CM

## 2018-06-09 DIAGNOSIS — E7800 Pure hypercholesterolemia, unspecified: Secondary | ICD-10-CM

## 2018-06-09 DIAGNOSIS — I440 Atrioventricular block, first degree: Secondary | ICD-10-CM

## 2018-06-09 DIAGNOSIS — I4819 Other persistent atrial fibrillation: Secondary | ICD-10-CM

## 2018-06-09 DIAGNOSIS — Z950 Presence of cardiac pacemaker: Secondary | ICD-10-CM

## 2018-06-09 DIAGNOSIS — I495 Sick sinus syndrome: Secondary | ICD-10-CM

## 2018-06-09 DIAGNOSIS — N183 Chronic kidney disease, stage 3 unspecified: Secondary | ICD-10-CM

## 2018-06-09 DIAGNOSIS — I5043 Acute on chronic combined systolic (congestive) and diastolic (congestive) heart failure: Secondary | ICD-10-CM

## 2018-06-09 DIAGNOSIS — I447 Left bundle-branch block, unspecified: Secondary | ICD-10-CM

## 2018-06-09 DIAGNOSIS — I480 Paroxysmal atrial fibrillation: Secondary | ICD-10-CM

## 2018-06-09 DIAGNOSIS — Z7901 Long term (current) use of anticoagulants: Secondary | ICD-10-CM

## 2018-06-09 MED ORDER — FUROSEMIDE 40 MG PO TABS: 40.0000 mg | ORAL_TABLET | Freq: Every day | ORAL | 3 refills | Status: DC

## 2018-06-09 MED ORDER — POTASSIUM CHLORIDE ER 20 MEQ PO TBCR: 20.0000 meq | EXTENDED_RELEASE_TABLET | Freq: Every day | ORAL | 3 refills | Status: DC

## 2018-06-09 NOTE — H&P (View-Only) (Signed)
Virtual Visit via Telephone Note   This visit type was conducted due to national recommendations for restrictions regarding the COVID-19 Pandemic (e.g. social distancing) in an effort to limit this patient's exposure and mitigate transmission in our community.  Due to her co-morbid illnesses, this patient is at least at moderate risk for complications without adequate follow up.  This format is felt to be most appropriate for this patient at this time.  The patient did not have access to video technology/had technical difficulties with video requiring transitioning to audio format only (telephone).  All issues noted in this document were discussed and addressed.  No physical exam could be performed with this format.  Please refer to the patient's chart for her  consent to telehealth for [facility].   Date:  06/09/2018   ID:  Monica Harvey, DOB February 20, 1940, MRN [id number]  Patient Location: Home Provider Location: Home  PCP:  Marva Panda, NP  Cardiologist:  Thurmon Fair, MD  Electrophysiologist:  None   Evaluation Performed:  Follow-Up Visit  Chief Complaint:  Dyspnea, edema  History of Present Illness:    Monica Harvey is a 78 y.o. female with longstanding tachycardia-bradycardia syndrome, dual-chamber permanent pacemaker (Medtronic), recurrent episodes of persistent atrial fibrillation on sotalol and direct oral anticoagulant, with previous history of heart failure decompensation due to tachycardia related cardiomyopathy, improved following rhythm correction.  She has been feeling poorly for the last couple of weeks.  She has developed abdominal distention and leg swelling, exertional dyspnea and increasingly frequent episodes of dizziness.  Her weight is up 12 pounds since her last office visit in October and 4 pounds since her last telemedicine visit in April.  She does not have orthopnea or PND.  She has not experienced frank syncope.  Pacemaker download performed on  May 26 shows that she has been in persistent atrial fibrillation for a week leading up to that event, coinciding with her dyspnea and edema onset.  Rate control is good.  She has been compliant with her oral anticoagulant and has not missed any doses in the last month.  She has not had any falls, injuries or bleeding problems.  She does believe that she may have missed an occasional evening dose of sotalol over the last month.  Her last echocardiogram in July 2019 showed improving left ventricular systolic function, LVEF up to 15-83% with grade 1 diastolic dysfunction.  The patient does not have symptoms concerning for COVID-19 infection (fever, chills, cough, or new shortness of breath).    Past Medical History:  Diagnosis Date  . Anginal pain (HCC)   . Chronic anticoagulation, CHAD2Vasc score 4 02/09/2014  . Chronic diastolic CHF (congestive heart failure) (HCC)    a. 01/2014 Echo: EF 50-55%, Gr1 DD, mild AI/MR.  . First degree atrioventricular block   . GERD (gastroesophageal reflux disease)   . Heart murmur   . HTN (hypertension)   . Hyperlipidemia   . LBBB (left bundle branch block)   . PAF (paroxysmal atrial fibrillation) (HCC)    a. Previously on flecainide;  b. 01/2014 sotalol loaded and dccv performed;  c. Chronic Savaysa Rx.  . Presence of permanent cardiac pacemaker    a. 12/26/12 s/p MDT DC PPM.  . SSS (sick sinus syndrome) (HCC)    a. s/p PPM-Medtronic placed 12/26/12   . Symptomatic sinus bradycardia    Past Surgical History:  Procedure Laterality Date  . APPENDECTOMY  1971  . BREAST EXCISIONAL BIOPSY Right   .  BREAST LUMPECTOMY Right 1980's?   "benign"  . CARDIOVERSION N/A 01/22/2014   Procedure: CARDIOVERSION;  Surgeon: Chrystie NoseKenneth C. Hilty, MD;  [facility];  Service: Cardiovascular;  Laterality: N/A;  . CARDIOVERSION N/A 04/10/2017   Procedure: CARDIOVERSION;  Surgeon: Jake BatheSkains, Mark C, MD;  [facility];  Service: Cardiovascular;  Laterality: N/A;  .  INSERT / REPLACE / REMOVE PACEMAKER  12/26/2012   Medtronic, Dr. Royann Shiversroitoru  . NM MYOCAR PERF WALL MOTION  08/05/2009   small to mod. reversible anteroseptal,septal,& apical perfusion defect. EF 59%.  Marland Kitchen. PERMANENT PACEMAKER INSERTION N/A 12/26/2012   Procedure: PERMANENT PACEMAKER INSERTION;  Surgeon: Thurmon FairMihai Leonilda Cozby, MD;  Location: [facility] CATH LAB;  Service: Cardiovascular;  Laterality: N/A;  . TONSILLECTOMY AND ADENOIDECTOMY  1960's  . TUBAL LIGATION  1971  . VAGINAL HYSTERECTOMY  1980's     Current Meds  Medication Sig  . acetaminophen (TYLENOL) 325 MG tablet Take 1-2 tablets (325-650 mg total) by mouth every 4 (four) hours as needed for mild pain. (Patient taking differently: Take 500 mg by mouth every 4 (four) hours as needed for mild pain. )  . diltiazem (CARDIZEM CD) 120 MG 24 hr capsule Take 1 capsule (120 mg total) by mouth daily.  . irbesartan (AVAPRO) 150 MG tablet TAKE 1 TABLET DAILY  . ondansetron (ZOFRAN ODT) 4 MG disintegrating tablet Take 1 tablet (4 mg total) by mouth every 8 (eight) hours as needed for nausea or vomiting.  . Pitavastatin Calcium (LIVALO) 1 MG TABS Take 1 tablet (1 mg total) by mouth daily.  Marland Kitchen. SAVAYSA 30 MG TABS tablet TAKE 1 TABLET DAILY. MAKE  AN APPOINTMENT FOR FURTHER REFILLS  . sotalol (BETAPACE) 120 MG tablet TAKE 1 TABLET EVERY 12     HOURS     Allergies:   Codeine   Social History   Tobacco Use  . Smoking status: Never Smoker  . Smokeless tobacco: Never Used  Substance Use Topics  . Alcohol use: No  . Drug use: No     Family Hx: The patient's family history includes Heart attack in her mother; Stroke in her sister.  ROS:   Please see the history of present illness.     All other systems reviewed and are negative.   Prior CV studies:   The following studies were reviewed today:  Pacemaker download May 21.  Echocardiogram August 05, 2017.  Labs/Other Tests and Data Reviewed:    EKG:  An ECG dated October 31, 2017 was personally reviewed  today and demonstrated:  Atrial paced, ventricular sensed rhythm with long AV delay (240 ms), native QRS with left bundle branch block (128 ms), QTC 489 ms  Recent Labs: 10/31/2017: ALT 20; BUN 22; Creatinine, Ser 1.12; Potassium 4.4; Sodium 142   Recent Lipid Panel Lab Results  Component Value Date/Time   CHOL 170 10/31/2017 10:14 AM   TRIG 109 10/31/2017 10:14 AM   HDL 61 10/31/2017 10:14 AM   CHOLHDL 2.8 10/31/2017 10:14 AM   CHOLHDL 2.5 09/01/2015 09:54 AM   LDLCALC 87 10/31/2017 10:14 AM    Wt Readings from Last 3 Encounters:  06/09/18 156 lb (70.8 kg)  04/30/18 152 lb (68.9 kg)  10/31/17 144 lb (65.3 kg)     Objective:    Vital Signs:  BP 97/76   Pulse (!) 59   Ht 5\' 3"  (1.6 m)   Wt 156 lb (70.8 kg)   BMI 27.63 kg/m    VITAL SIGNS:  reviewed Unable to examine  ASSESSMENT & PLAN:    1. CHF: Appears to have acute exacerbation of chronic combined systolic and diastolic heart failure (most recent EF 45-50%) in the setting of recurrent persistent atrial fibrillation.  Will start furosemide 40 mg daily and potassium chloride 20 mEq daily.  Plan to perform labs, including coronavirus testing, this week, before cardioversion. 2. AFib: Her decompensation appears to be occurring despite good ventricular rate control as evidenced by for heart rate histograms on her pacemaker.  She will require cardioversion. CHADSVasc 4-5 (age, gender, HTN, CHF, possible CAD). She has been compliant with the anticoagulation. This procedure has been fully reviewed with the patient and informed consent has been obtained. 3. Anticoagulation: She has not missed any doses of Savaysa in the last 30 days.  No bleeding problems. 4. PM: Normal device function.  We will have her do another download on her pacemaker today to confirm that the arrhythmia is ongoing and if it is plan to perform cardioversion later this week. 5. SSS: Outside the episodes of atrial fibrillation she had appropriate sensor settings,  without any evidence of chronotropic incompetence. 6. Hypotension: Her blood pressure is borderline low and we may have to discontinue the irbesartan, even though it may be beneficial for heart failure. 7. Sotalol: Encourage 100% compliance with twice daily dosing of sotalol.  Will check QT interval when she comes in for the cardioversion. 8. HLP: Fair LDL cholesterol on current dose of Livalo.  She has not tolerated more potent statins or higher doses of Livalo.  No clear confirmation of vascular disease/CAD.  LDL less than 100 is acceptable. 2  COVID-19 Education: The signs and symptoms of COVID-19 were discussed with the patient and how to seek care for testing (follow up with PCP or arrange E-visit).  The importance of social distancing was discussed today.  Time:   Today, I have spent 22 minutes with the patient with telehealth technology discussing the above problems.     Medication Adjustments/Labs and Tests Ordered: Current medicines are reviewed at length with the patient today.  Concerns regarding medicines are outlined above.   Tests Ordered: Orders Placed This Encounter  Procedures  . Basic metabolic panel  . CBC    Medication Changes: Meds ordered this encounter  Medications  . furosemide (LASIX) 40 MG tablet    Sig: Take 1 tablet (40 mg total) by mouth daily.    Dispense:  90 tablet    Refill:  3  . potassium chloride 20 MEQ TBCR    Sig: Take 20 mEq by mouth daily.    Dispense:  90 tablet    Refill:  3   Patient Instructions  Medication Instructions:  START Furosemide 40 mg once daily START Potassium 20 mEq once daily  If you need a refill on your cardiac medications before your next appointment, please call your pharmacy.   Lab work: Your provider would like for you to return on Tuesday 06/10/2018 to have the following labs drawn: CBC and BMET. You do not need an appointment for the lab. Once in our office lobby there is a podium where you can sign in and ring  the doorbell to alert us that you are here. The lab is open from 8:00 am to 4:30 pm; closed for lunch from 12:45pm-1:45pm.  If you have labs (blood work) drawn today and your tests are completely normal, you will receive your results only by: MyChart Message (if you have MyChart) OR A paper copy in the mail If you   have any lab test that is abnormal or we need to change your treatment, we will call you to review the results.  Testing/Procedures: You are scheduled for a Cardioversion on 6/5/202 with Dr. Royann Shivers.  Please arrive at the [facility] (Main Entrance A) at [facility]: [address] [place], [place] [address] at 7 am. (1 hour prior to procedure).  DIET: Nothing to eat or drink after midnight except a sip of water with medications (see medication instructions below)  Medication Instructions: Hold the furosemide the morning of the cardioversion  Continue your anticoagulant: Savaysa You will need to continue your anticoagulant after your procedure until you are told by your provider that it is safe to stop   Labs:  Your provider would like for you to return on Tuesday 06/10/2018 to have the following labs drawn: CBC and BMET. You do not need an appointment for the lab. Once in our office lobby there is a podium where you can sign in and ring the doorbell to alert [place] that you are here. The lab is open from 8:00 am to 4:30 pm; closed for lunch from 12:45pm-1:45pm.   You must have a responsible person to drive you home and stay in the waiting area during your procedure. Failure to do so could result in cancellation.  Bring your insurance cards.  *Special Note: Every effort is made to have your procedure done on time. Occasionally there are emergencies that occur at the hospital that may cause delays. Please be patient if a delay does occur.    Follow-Up: At [facility], you and your health needs are our priority.  As part of our continuing mission to provide you with  exceptional heart care, we have created designated Provider Care Teams.  These Care Teams include your primary Cardiologist (physician) and Advanced Practice Providers (APPs -  Physician Assistants and Nurse Practitioners) who all work together to provide you with the care you need, when you need it. You will need a follow up appointment in 1 months.  Please call our office 2 months in advance to schedule this appointment.  You may see Thurmon Fair, MD or one of the following Advanced Practice Providers on your designated Care Team: Azalee Course, PA-C Micah Flesher, [place]         Disposition:  Follow up 3 months  Signed, Thurmon Fair, MD  06/09/2018 11:21 AM    [facility]

## 2018-06-09 NOTE — Telephone Encounter (Signed)
Call placed to the patient to go over the cardioversion instructions ordered art her telehealth visit today.  She has been advised that these instructions will also be mailed to her. The cardioversion is scheduled for 06/13/2018 with Dr. Royann Shivers at 8:15 am.   DIET: Nothing to eat or drink after midnight except a sip of water with medications (see medication instructions below)  Medication Instructions: Hold the furosemide the morning of the cardioversion  Continue your anticoagulant: Savaysa You will need to continue your anticoagulant after your procedure until you are told by your provider that it is safe to stop  Labs:  Your provider would like for you to return on Tuesday 06/10/2018 at the [facility] office to have the following labs drawn: CBC and BMET. You do not need an appointment for the lab. Once in our office lobby there is a podium where you can sign in and ring the doorbell to alert [place] that you are here. The lab is open from 8:00 am to 4:30 pm; closed for lunch from 12:45pm-1:45pm.  After you have had the labs drawn at the [facility] office, please proceed to [facility], education center. You have an appointment scheduled at 12:55 for the coronavirus test. This is a drive thru test only. After the test is completed you will need to stay quarantined until the cardioversion. Please be sure to have the BMET and CBC completed prior to the coronavirus test.      COVID-19 Pre-Screening Questions:  . In the past 7 to 10 days have you had a cough,  shortness of breath, headache, congestion, fever (100 or greater) body aches, chills, sore throat, or sudden loss of taste or sense of smell? No . Have you been around anyone with known Covid 19. No . Have you been around anyone who is awaiting Covid 19 test results in the past 7 to 10 days? No . Have you been around anyone who has been exposed to Covid 19, or has mentioned symptoms of Covid 19 within the past 7 to 10 days? No  If  you have any concerns/questions about symptoms patients report during screening (either on the phone or at threshold). Contact the provider seeing the patient or DOD for further guidance.  If neither are available contact a member of the leadership team.

## 2018-06-09 NOTE — Patient Instructions (Addendum)
Medication Instructions:  START Furosemide 40 mg once daily START Potassium 20 mEq once daily  If you need a refill on your cardiac medications before your next appointment, please call your pharmacy.   Lab work: Your provider would like for you to return on Tuesday 06/10/2018 to have the following labs drawn: CBC and BMET. You do not need an appointment for the lab. Once in our office lobby there is a podium where you can sign in and ring the doorbell to alert [place] that you are here. The lab is open from 8:00 am to 4:30 pm; closed for lunch from 12:45pm-1:45pm.  If you have labs (blood work) drawn today and your tests are completely normal, you will receive your results only by: MyChart Message (if you have MyChart) OR A paper copy in the mail If you have any lab test that is abnormal or we need to change your treatment, we will call you to review the results.  Testing/Procedures: You are scheduled for a Cardioversion on 06/13/2018 with Dr. Royann Shivers.  Please arrive at the [facility] (Main Entrance A) at [facility]: [address] [place], [place] [address] at 7 am. (1 hour prior to procedure).  DIET: Nothing to eat or drink after midnight except a sip of water with medications (see medication instructions below)  Medication Instructions: Hold the furosemide the morning of the cardioversion  Continue your anticoagulant: Savaysa You will need to continue your anticoagulant after your procedure until you are told by your provider that it is safe to stop   Labs:  Your provider would like for you to return on Tuesday 06/10/2018 to have the following labs drawn: CBC and BMET. You do not need an appointment for the lab. Once in our office lobby there is a podium where you can sign in and ring the doorbell to alert [place] that you are here. The lab is open from 8:00 am to 4:30 pm; closed for lunch from 12:45pm-1:45pm.  After you have had the labs drawn at the [facility] office, please proceed to  [facility], education center. You have an appointment scheduled at 12:55 for the coronavirus test. This is a drive thru test only. After the test is completed you will need to stay quarantined until the cardioversion. Please be sure to have the BMET and CBC completed prior to the coronavirus test.  You must have a responsible person to drive you home and stay in the waiting area during your procedure. Failure to do so could result in cancellation.  Bring your insurance cards.  *Special Note: Every effort is made to have your procedure done on time. Occasionally there are emergencies that occur at the hospital that may cause delays. Please be patient if a delay does occur.    Follow-Up: At [facility], you and your health needs are our priority.  As part of our continuing mission to provide you with exceptional heart care, we have created designated Provider Care Teams.  These Care Teams include your primary Cardiologist (physician) and Advanced Practice Providers (APPs -  Physician Assistants and Nurse Practitioners) who all work together to provide you with the care you need, when you need it. You will need a follow up appointment in 1 months.  Please call our office 2 months in advance to schedule this appointment.  You may see Thurmon Fair, MD or one of the following Advanced Practice Providers on your designated Care Team: Azalee Course, PA-C Micah Flesher, [place]

## 2018-06-09 NOTE — Progress Notes (Addendum)
Virtual Visit via Telephone Note   This visit type was conducted due to national recommendations for restrictions regarding the COVID-19 Pandemic (e.g. social distancing) in an effort to limit this patient's exposure and mitigate transmission in our community.  Due to her co-morbid illnesses, this patient is at least at moderate risk for complications without adequate follow up.  This format is felt to be most appropriate for this patient at this time.  The patient did not have access to video technology/had technical difficulties with video requiring transitioning to audio format only (telephone).  All issues noted in this document were discussed and addressed.  No physical exam could be performed with this format.  Please refer to the patient's chart for her  consent to telehealth for [facility].   Date:  06/09/2018   ID:  Monica Harvey, DOB February 20, 1940, MRN [id number]  Patient Location: Home Provider Location: Home  PCP:  Marva Panda, NP  Cardiologist:  Thurmon Fair, MD  Electrophysiologist:  None   Evaluation Performed:  Follow-Up Visit  Chief Complaint:  Dyspnea, edema  History of Present Illness:    Monica Harvey is a 78 y.o. female with longstanding tachycardia-bradycardia syndrome, dual-chamber permanent pacemaker (Medtronic), recurrent episodes of persistent atrial fibrillation on sotalol and direct oral anticoagulant, with previous history of heart failure decompensation due to tachycardia related cardiomyopathy, improved following rhythm correction.  She has been feeling poorly for the last couple of weeks.  She has developed abdominal distention and leg swelling, exertional dyspnea and increasingly frequent episodes of dizziness.  Her weight is up 12 pounds since her last office visit in October and 4 pounds since her last telemedicine visit in April.  She does not have orthopnea or PND.  She has not experienced frank syncope.  Pacemaker download performed on  May 26 shows that she has been in persistent atrial fibrillation for a week leading up to that event, coinciding with her dyspnea and edema onset.  Rate control is good.  She has been compliant with her oral anticoagulant and has not missed any doses in the last month.  She has not had any falls, injuries or bleeding problems.  She does believe that she may have missed an occasional evening dose of sotalol over the last month.  Her last echocardiogram in July 2019 showed improving left ventricular systolic function, LVEF up to 15-83% with grade 1 diastolic dysfunction.  The patient does not have symptoms concerning for COVID-19 infection (fever, chills, cough, or new shortness of breath).    Past Medical History:  Diagnosis Date  . Anginal pain (HCC)   . Chronic anticoagulation, CHAD2Vasc score 4 02/09/2014  . Chronic diastolic CHF (congestive heart failure) (HCC)    a. 01/2014 Echo: EF 50-55%, Gr1 DD, mild AI/MR.  . First degree atrioventricular block   . GERD (gastroesophageal reflux disease)   . Heart murmur   . HTN (hypertension)   . Hyperlipidemia   . LBBB (left bundle branch block)   . PAF (paroxysmal atrial fibrillation) (HCC)    a. Previously on flecainide;  b. 01/2014 sotalol loaded and dccv performed;  c. Chronic Savaysa Rx.  . Presence of permanent cardiac pacemaker    a. 12/26/12 s/p MDT DC PPM.  . SSS (sick sinus syndrome) (HCC)    a. s/p PPM-Medtronic placed 12/26/12   . Symptomatic sinus bradycardia    Past Surgical History:  Procedure Laterality Date  . APPENDECTOMY  1971  . BREAST EXCISIONAL BIOPSY Right   .  BREAST LUMPECTOMY Right 1980's?   "benign"  . CARDIOVERSION N/A 01/22/2014   Procedure: CARDIOVERSION;  Surgeon: Chrystie NoseKenneth C. Hilty, MD;  [facility];  Service: Cardiovascular;  Laterality: N/A;  . CARDIOVERSION N/A 04/10/2017   Procedure: CARDIOVERSION;  Surgeon: Jake BatheSkains, Mark C, MD;  [facility];  Service: Cardiovascular;  Laterality: N/A;  .  INSERT / REPLACE / REMOVE PACEMAKER  12/26/2012   Medtronic, Dr. Royann Shiversroitoru  . NM MYOCAR PERF WALL MOTION  08/05/2009   small to mod. reversible anteroseptal,septal,& apical perfusion defect. EF 59%.  Marland Kitchen. PERMANENT PACEMAKER INSERTION N/A 12/26/2012   Procedure: PERMANENT PACEMAKER INSERTION;  Surgeon: Thurmon FairMihai Bora Broner, MD;  Location: [facility] CATH LAB;  Service: Cardiovascular;  Laterality: N/A;  . TONSILLECTOMY AND ADENOIDECTOMY  1960's  . TUBAL LIGATION  1971  . VAGINAL HYSTERECTOMY  1980's     Current Meds  Medication Sig  . acetaminophen (TYLENOL) 325 MG tablet Take 1-2 tablets (325-650 mg total) by mouth every 4 (four) hours as needed for mild pain. (Patient taking differently: Take 500 mg by mouth every 4 (four) hours as needed for mild pain. )  . diltiazem (CARDIZEM CD) 120 MG 24 hr capsule Take 1 capsule (120 mg total) by mouth daily.  . irbesartan (AVAPRO) 150 MG tablet TAKE 1 TABLET DAILY  . ondansetron (ZOFRAN ODT) 4 MG disintegrating tablet Take 1 tablet (4 mg total) by mouth every 8 (eight) hours as needed for nausea or vomiting.  . Pitavastatin Calcium (LIVALO) 1 MG TABS Take 1 tablet (1 mg total) by mouth daily.  Marland Kitchen. SAVAYSA 30 MG TABS tablet TAKE 1 TABLET DAILY. MAKE  AN APPOINTMENT FOR FURTHER REFILLS  . sotalol (BETAPACE) 120 MG tablet TAKE 1 TABLET EVERY 12     HOURS     Allergies:   Codeine   Social History   Tobacco Use  . Smoking status: Never Smoker  . Smokeless tobacco: Never Used  Substance Use Topics  . Alcohol use: No  . Drug use: No     Family Hx: The patient's family history includes Heart attack in her mother; Stroke in her sister.  ROS:   Please see the history of present illness.     All other systems reviewed and are negative.   Prior CV studies:   The following studies were reviewed today:  Pacemaker download May 21.  Echocardiogram August 05, 2017.  Labs/Other Tests and Data Reviewed:    EKG:  An ECG dated October 31, 2017 was personally reviewed  today and demonstrated:  Atrial paced, ventricular sensed rhythm with long AV delay (240 ms), native QRS with left bundle branch block (128 ms), QTC 489 ms  Recent Labs: 10/31/2017: ALT 20; BUN 22; Creatinine, Ser 1.12; Potassium 4.4; Sodium 142   Recent Lipid Panel Lab Results  Component Value Date/Time   CHOL 170 10/31/2017 10:14 AM   TRIG 109 10/31/2017 10:14 AM   HDL 61 10/31/2017 10:14 AM   CHOLHDL 2.8 10/31/2017 10:14 AM   CHOLHDL 2.5 09/01/2015 09:54 AM   LDLCALC 87 10/31/2017 10:14 AM    Wt Readings from Last 3 Encounters:  06/09/18 156 lb (70.8 kg)  04/30/18 152 lb (68.9 kg)  10/31/17 144 lb (65.3 kg)     Objective:    Vital Signs:  BP 97/76   Pulse (!) 59   Ht 5\' 3"  (1.6 m)   Wt 156 lb (70.8 kg)   BMI 27.63 kg/m    VITAL SIGNS:  reviewed Unable to examine  ASSESSMENT & PLAN:    1. CHF: Appears to have acute exacerbation of chronic combined systolic and diastolic heart failure (most recent EF 45-50%) in the setting of recurrent persistent atrial fibrillation.  Will start furosemide 40 mg daily and potassium chloride 20 mEq daily.  Plan to perform labs, including coronavirus testing, this week, before cardioversion. 2. AFib: Her decompensation appears to be occurring despite good ventricular rate control as evidenced by for heart rate histograms on her pacemaker.  She will require cardioversion. CHADSVasc 4-5 (age, gender, HTN, CHF, possible CAD). She has been compliant with the anticoagulation. This procedure has been fully reviewed with the patient and informed consent has been obtained. 3. Anticoagulation: She has not missed any doses of Savaysa in the last 30 days.  No bleeding problems. 4. PM: Normal device function.  We will have her do another download on her pacemaker today to confirm that the arrhythmia is ongoing and if it is plan to perform cardioversion later this week. 5. SSS: Outside the episodes of atrial fibrillation she had appropriate sensor settings,  without any evidence of chronotropic incompetence. 6. Hypotension: Her blood pressure is borderline low and we may have to discontinue the irbesartan, even though it may be beneficial for heart failure. 7. Sotalol: Encourage 100% compliance with twice daily dosing of sotalol.  Will check QT interval when she comes in for the cardioversion. 8. HLP: Fair LDL cholesterol on current dose of Livalo.  She has not tolerated more potent statins or higher doses of Livalo.  No clear confirmation of vascular disease/CAD.  LDL less than 100 is acceptable. 2  COVID-19 Education: The signs and symptoms of COVID-19 were discussed with the patient and how to seek care for testing (follow up with PCP or arrange E-visit).  The importance of social distancing was discussed today.  Time:   Today, I have spent 22 minutes with the patient with telehealth technology discussing the above problems.     Medication Adjustments/Labs and Tests Ordered: Current medicines are reviewed at length with the patient today.  Concerns regarding medicines are outlined above.   Tests Ordered: Orders Placed This Encounter  Procedures  . Basic metabolic panel  . CBC    Medication Changes: Meds ordered this encounter  Medications  . furosemide (LASIX) 40 MG tablet    Sig: Take 1 tablet (40 mg total) by mouth daily.    Dispense:  90 tablet    Refill:  3  . potassium chloride 20 MEQ TBCR    Sig: Take 20 mEq by mouth daily.    Dispense:  90 tablet    Refill:  3   Patient Instructions  Medication Instructions:  START Furosemide 40 mg once daily START Potassium 20 mEq once daily  If you need a refill on your cardiac medications before your next appointment, please call your pharmacy.   Lab work: Your provider would like for you to return on Tuesday 06/10/2018 to have the following labs drawn: CBC and BMET. You do not need an appointment for the lab. Once in our office lobby there is a podium where you can sign in and ring  the doorbell to alert [place] that you are here. The lab is open from 8:00 am to 4:30 pm; closed for lunch from 12:45pm-1:45pm.  If you have labs (blood work) drawn today and your tests are completely normal, you will receive your results only by: MyChart Message (if you have MyChart) OR A paper copy in the mail If you  have any lab test that is abnormal or we need to change your treatment, we will call you to review the results.  Testing/Procedures: You are scheduled for a Cardioversion on 6/5/202 with Dr. Royann Shivers.  Please arrive at the [facility] (Main Entrance A) at [facility]: [address] [place], [place] [address] at 7 am. (1 hour prior to procedure).  DIET: Nothing to eat or drink after midnight except a sip of water with medications (see medication instructions below)  Medication Instructions: Hold the furosemide the morning of the cardioversion  Continue your anticoagulant: Savaysa You will need to continue your anticoagulant after your procedure until you are told by your provider that it is safe to stop   Labs:  Your provider would like for you to return on Tuesday 06/10/2018 to have the following labs drawn: CBC and BMET. You do not need an appointment for the lab. Once in our office lobby there is a podium where you can sign in and ring the doorbell to alert [place] that you are here. The lab is open from 8:00 am to 4:30 pm; closed for lunch from 12:45pm-1:45pm.   You must have a responsible person to drive you home and stay in the waiting area during your procedure. Failure to do so could result in cancellation.  Bring your insurance cards.  *Special Note: Every effort is made to have your procedure done on time. Occasionally there are emergencies that occur at the hospital that may cause delays. Please be patient if a delay does occur.    Follow-Up: At [facility], you and your health needs are our priority.  As part of our continuing mission to provide you with  exceptional heart care, we have created designated Provider Care Teams.  These Care Teams include your primary Cardiologist (physician) and Advanced Practice Providers (APPs -  Physician Assistants and Nurse Practitioners) who all work together to provide you with the care you need, when you need it. You will need a follow up appointment in 1 months.  Please call our office 2 months in advance to schedule this appointment.  You may see Thurmon Fair, MD or one of the following Advanced Practice Providers on your designated Care Team: Azalee Course, PA-C Micah Flesher, [place]         Disposition:  Follow up 3 months  Signed, Thurmon Fair, MD  06/09/2018 11:21 AM    [facility]

## 2018-06-10 ENCOUNTER — Other Ambulatory Visit (HOSPITAL_COMMUNITY)
Admission: RE | Admit: 2018-06-10 | Discharge: 2018-06-10 | Disposition: A | Discharge status: Home / Self Care | Payer: Medicare Other | Source: Ambulatory Visit | Attending: Surgery | Admitting: Surgery

## 2018-06-10 ENCOUNTER — Other Ambulatory Visit: Payer: Self-pay

## 2018-06-10 DIAGNOSIS — I480 Paroxysmal atrial fibrillation: Secondary | ICD-10-CM

## 2018-06-10 DIAGNOSIS — Z1159 Encounter for screening for other viral diseases: Secondary | ICD-10-CM | POA: Insufficient documentation

## 2018-06-10 DIAGNOSIS — I5042 Chronic combined systolic (congestive) and diastolic (congestive) heart failure: Secondary | ICD-10-CM

## 2018-06-10 LAB — BASIC METABOLIC PANEL
BUN/Creatinine Ratio: 16 (ref 12–28)
BUN: 26 mg/dL (ref 8–27)
CO2: 21 mmol/L (ref 20–29)
Calcium: 10.1 mg/dL (ref 8.7–10.3)
Chloride: 102 mmol/L (ref 96–106)
Creatinine, Ser: 1.62 mg/dL — ABNORMAL HIGH (ref 0.57–1.00)
GFR calc Af Amer: 35 mL/min/{1.73_m2} — ABNORMAL LOW (ref >59)
GFR calc non Af Amer: 31 mL/min/{1.73_m2} — ABNORMAL LOW (ref >59)
Glucose: 90 mg/dL (ref 65–99)
Potassium: 4.8 mmol/L (ref 3.5–5.2)
Sodium: 141 mmol/L (ref 134–144)

## 2018-06-10 LAB — CBC
Hematocrit: 40.5 % (ref 34.0–46.6)
Hemoglobin: 14.2 g/dL (ref 11.1–15.9)
MCH: 31.2 pg (ref 26.6–33.0)
MCHC: 35.1 g/dL (ref 31.5–35.7)
MCV: 89 fL (ref 79–97)
Platelets: 302 10*3/uL (ref 150–450)
RBC: 4.55 x10E6/uL (ref 3.77–5.28)
RDW: 12.4 % (ref 11.7–15.4)
WBC: 7.6 10*3/uL (ref 3.4–10.8)

## 2018-06-11 LAB — NOVEL CORONAVIRUS, NAA (HOSP ORDER, SEND-OUT TO REF LAB; TAT 18-24 HRS): SARS-CoV-2, NAA: NOT DETECTED

## 2018-06-12 NOTE — Progress Notes (Signed)
Remote pacemaker transmission.   

## 2018-06-13 ENCOUNTER — Ambulatory Visit (HOSPITAL_COMMUNITY): Payer: Medicare Other | Admitting: Critical Care Medicine

## 2018-06-13 ENCOUNTER — Other Ambulatory Visit: Payer: Self-pay

## 2018-06-13 ENCOUNTER — Encounter (HOSPITAL_COMMUNITY)
Admission: RE | Disposition: A | Discharge status: Home / Self Care | Payer: Self-pay | Source: Home / Self Care | Attending: Cardiovascular Disease

## 2018-06-13 ENCOUNTER — Telehealth: Payer: Self-pay | Admitting: *Deleted

## 2018-06-13 ENCOUNTER — Encounter (HOSPITAL_COMMUNITY): Payer: Self-pay

## 2018-06-13 ENCOUNTER — Ambulatory Visit (HOSPITAL_COMMUNITY)
Admission: RE | Admit: 2018-06-13 | Discharge: 2018-06-13 | Disposition: A | Discharge status: Home / Self Care | Payer: Medicare Other | Attending: Cardiovascular Disease | Admitting: Cardiovascular Disease

## 2018-06-13 DIAGNOSIS — Z538 Procedure and treatment not carried out for other reasons: Secondary | ICD-10-CM | POA: Insufficient documentation

## 2018-06-13 DIAGNOSIS — Z950 Presence of cardiac pacemaker: Secondary | ICD-10-CM

## 2018-06-13 DIAGNOSIS — I495 Sick sinus syndrome: Secondary | ICD-10-CM

## 2018-06-13 DIAGNOSIS — R140 Abdominal distension (gaseous): Secondary | ICD-10-CM | POA: Diagnosis not present

## 2018-06-13 DIAGNOSIS — I5042 Chronic combined systolic (congestive) and diastolic (congestive) heart failure: Secondary | ICD-10-CM

## 2018-06-13 DIAGNOSIS — I4891 Unspecified atrial fibrillation: Secondary | ICD-10-CM | POA: Diagnosis not present

## 2018-06-13 DIAGNOSIS — N183 Chronic kidney disease, stage 3 unspecified: Secondary | ICD-10-CM

## 2018-06-13 DIAGNOSIS — I447 Left bundle-branch block, unspecified: Secondary | ICD-10-CM | POA: Insufficient documentation

## 2018-06-13 DIAGNOSIS — I4819 Other persistent atrial fibrillation: Secondary | ICD-10-CM

## 2018-06-13 DIAGNOSIS — R0609 Other forms of dyspnea: Secondary | ICD-10-CM | POA: Insufficient documentation

## 2018-06-13 SURGERY — CANCELLED PROCEDURE

## 2018-06-13 NOTE — Telephone Encounter (Signed)
Patient made aware of results and verbalized understanding.  Repeat BMET has been ordered for one month from now (per Dr. Royann Shivers staff message).

## 2018-06-13 NOTE — Interval H&P Note (Signed)
History and Physical Interval Note:  06/13/2018 7:58 AM  [name]  has presented today for surgery, with the diagnosis of AFID.  The various methods of treatment have been discussed with the patient and family. After consideration of risks, benefits and other options for treatment, the patient has consented to  Procedure(s): CARDIOVERSION (N/A) as a surgical intervention.  The patient's history has been reviewed, patient examined, no change in status, stable for surgery.  I have reviewed the patient's chart and labs.  Questions were answered to the patient's satisfaction.     Monica Harvey

## 2018-06-13 NOTE — Progress Notes (Signed)
On arrival, found to have atrial paced rhythm. Pacemaker interrogation shows that spontaneous conversion occurred in the last 24 hours, after approximately 2 weeks of atrial fibrillation. Otherwise normal pacemaker function.  Apced, V sensed with old LBBB, QTc 490 ms. Feels improvement on the higher furosemide dose. Edema has resolved, but still has abdominal distention and mild exertional dyspnea. She should continue the 40 mg daily dose of furosemide until her weight returns to baseline of 141-144 lb, then go back to the 20 mg daily dose. Will arrange virtual follow up in 30 days and follow up labs drawn locally just before that visit.  Thurmon Fair, MD, [facility] [phone] office [phone] pager

## 2018-06-13 NOTE — Telephone Encounter (Signed)
-----   Message from Thurmon Fair, MD sent at 06/10/2018  5:39 PM EDT ----- Renal function a little worse (this happened last time she had persistent AFib and CHF). Other labs OK.

## 2018-06-13 NOTE — Progress Notes (Signed)
Patient arrived for outpatient cardioversion, atrial paced on monitor, medtronic interrogated. Per Dr. Royann Shivers cardioversion cancelled, he saw patient at bedside prior to discharge to home.

## 2018-06-23 ENCOUNTER — Telehealth: Payer: Self-pay | Admitting: Cardiovascular Disease

## 2018-06-23 NOTE — Telephone Encounter (Signed)
Left message to call back.  Needs 4-6 week fu with APP per Dr C.

## 2018-07-14 NOTE — Progress Notes (Addendum)
Virtual Visit via Telephone Note   This visit type was conducted due to national recommendations for restrictions regarding the COVID-19 Pandemic (e.g. social distancing) in an effort to limit this patient's exposure and mitigate transmission in our community.  Due to her co-morbid illnesses, this patient is at least at moderate risk for complications without adequate follow up.  This format is felt to be most appropriate for this patient at this time.  The patient did not have access to video technology/had technical difficulties with video requiring transitioning to audio format only (telephone).  All issues noted in this document were discussed and addressed.  No physical exam could be performed with this format.  Please refer to the patient's chart for her  consent to telehealth for [facility].   Date:  07/15/2018   ID:  Monica Harvey, DOB 1940/08/19, MRN [id number]  Patient Location: Home Provider Location: Office  PCP:  Monica Beals, NP  Cardiologist:  Monica Klein, MD  Electrophysiologist:  None   Evaluation Performed:  Follow-Up Visit  Chief Complaint:  Post hospitalization follow up, after DCCV.   History of Present Illness:    Monica Harvey is a 78 y.o. female with longstanding history of tachybradycardia syndrome dual-chamber permanent pacemaker (Medtronic) in situ, recurrent episodes of persistent atrial fibrillation on sotalol and anticoagulation with Savaysa 30 mg daily CHADS VASC Score 4-5.Marland Kitchen  Other history includes chronic combined systolic and diastolic heart failure with most recent EF of 45% to 50% in the setting of recurrent persistent atrial fibrillation and hyperlipidemia. .  The patient was planned for DCCV on 06/13/2018 however the patient was found to be in atrial paced rhythm and did not require cardioversion.  Prior to being seen he had increased dose of furosemide to 40 mg daily with resolution of edema however she did have some abdominal distention  and mild exertional dyspnea.  She was continued on the Lasix dose until her weight return to 141-144 pounds and then decrease back to 20 mg daily.  Repeat BMET revealed a creatinine of 1.62, potassium 4.8.  She was planned to have repeat labs on follow-up.  The patient comes today without any new cardiac complaints.  She has continued complaints of generalized fatigue with walking due to shortness of breath.  She denies rapid heart rate or irregular rhythm at this time.  She has not been able to take her blood pressure at home as her blood pressure machine has been malfunctioning and she is going out to buy a new machine today.  According to her blood pressure machine her blood pressure is 106/84 today.  This may be causing her to have some fatigue if it is actually that low.  The patient does not have symptoms concerning for COVID-19 infection (fever, chills, cough, or new shortness of breath).    Past Medical History:  Diagnosis Date  . Anginal pain ([place])   . Chronic anticoagulation, CHAD2Vasc score 4 02/09/2014  . Chronic diastolic CHF (congestive heart failure) ([place])    a. 01/2014 Echo: EF 50-55%, Gr1 DD, mild AI/MR.  . First degree atrioventricular block   . GERD (gastroesophageal reflux disease)   . Heart murmur   . HTN (hypertension)   . Hyperlipidemia   . LBBB (left bundle branch block)   . PAF (paroxysmal atrial fibrillation) ([place])    a. Previously on flecainide;  b. 01/2014 sotalol loaded and dccv performed;  c. Chronic Savaysa Rx.  . Presence of permanent cardiac pacemaker  a. 12/26/12 s/p MDT DC PPM.  . SSS (sick sinus syndrome) ([facility])    a. s/p PPM-Medtronic placed 12/26/12   . Symptomatic sinus bradycardia    Past Surgical History:  Procedure Laterality Date  . APPENDECTOMY  1971  . BREAST EXCISIONAL BIOPSY Right   . BREAST LUMPECTOMY Right 1980's?   "benign"  . CARDIOVERSION N/A 01/22/2014   Procedure: CARDIOVERSION;  Surgeon: Chrystie NoseKenneth C. Hilty, MD;  [facility];  Service: Cardiovascular;  Laterality: N/A;  . CARDIOVERSION N/A 04/10/2017   Procedure: CARDIOVERSION;  Surgeon: Jake BatheSkains, Mark C, MD;  [facility];  Service: Cardiovascular;  Laterality: N/A;  . INSERT / REPLACE / REMOVE PACEMAKER  12/26/2012   Medtronic, Dr. Royann Shiversroitoru  . NM MYOCAR PERF WALL MOTION  08/05/2009   small to mod. reversible anteroseptal,septal,& apical perfusion defect. EF 59%.  Marland Kitchen. PERMANENT PACEMAKER INSERTION N/A 12/26/2012   Procedure: PERMANENT PACEMAKER INSERTION;  Surgeon: Thurmon FairMihai Croitoru, MD;  Location: [facility] CATH LAB;  Service: Cardiovascular;  Laterality: N/A;  . TONSILLECTOMY AND ADENOIDECTOMY  1960's  . TUBAL LIGATION  1971  . VAGINAL HYSTERECTOMY  1980's     Current Meds  Medication Sig  . acetaminophen (TYLENOL) 500 MG tablet Take 500 mg by mouth daily as needed for moderate pain or headache.  . albuterol (VENTOLIN HFA) 108 (90 Base) MCG/ACT inhaler Inhale 2 puffs into the lungs every 6 (six) hours as needed for wheezing or shortness of breath.  . diltiazem (CARDIZEM CD) 120 MG 24 hr capsule Take 1 capsule (120 mg total) by mouth daily. (Patient taking differently: Take 120 mg by mouth daily at 12 noon. )  . fluticasone (FLONASE) 50 MCG/ACT nasal spray Place 1 spray into both nostrils daily as needed for allergies or rhinitis.  . furosemide (LASIX) 40 MG tablet Take 1 tablet (40 mg total) by mouth daily.  . irbesartan (AVAPRO) 150 MG tablet TAKE 1 TABLET DAILY (Patient taking differently: Take 150 mg by mouth daily. )  . omeprazole (PRILOSEC) 20 MG capsule Take 20 mg by mouth daily as needed (acid reflux).  . ondansetron (ZOFRAN ODT) 4 MG disintegrating tablet Take 1 tablet (4 mg total) by mouth every 8 (eight) hours as needed for nausea or vomiting.  . Pitavastatin Calcium (LIVALO) 1 MG TABS Take 1 tablet (1 mg total) by mouth daily.  . potassium chloride 20 MEQ TBCR Take 20 mEq by mouth daily.  Marland Kitchen. SAVAYSA 30 MG TABS tablet TAKE 1 TABLET DAILY. MAKE   AN APPOINTMENT FOR FURTHER REFILLS (Patient taking differently: Take 30 mg by mouth daily. )  . sotalol (BETAPACE) 120 MG tablet TAKE 1 TABLET EVERY 12     HOURS (Patient taking differently: Take 120 mg by mouth 2 (two) times daily. )     Allergies:   Codeine   Social History   Tobacco Use  . Smoking status: Never Smoker  . Smokeless tobacco: Never Used  Substance Use Topics  . Alcohol use: No  . Drug use: No     Family Hx: The patient's family history includes Heart attack in her mother; Stroke in her sister.  ROS:   Please see the history of present illness.    All other systems reviewed and are negative.   Prior CV studies:   The following studies were reviewed today:  Echocardiogram 08/03/2017 Left ventricle: The cavity size was normal. Wall thickness was   normal. Systolic function was mildly reduced. The estimated   ejection fraction was in the range  of 45% to 50%. Diffuse   hypokinesis. Doppler parameters are consistent with abnormal left   ventricular relaxation (grade 1 diastolic dysfunction). Doppler   parameters are consistent with high ventricular filling pressure. - Ventricular septum: Septal motion showed abnormal function and   dyssynergy. - Aortic valve: There was mild regurgitation. - Ascending aorta: The ascending aorta was mildly dilated. - Mitral valve: There was mild regurgitation. - Tricuspid valve: There was moderate regurgitation. - Pulmonary arteries: Systolic pressure was mildly increased. PA   peak pressure: 42 mm Hg (S).  Impressions:  - Mild global reduction in LV systolic function (EF 50); mild   diastolic dysfunction; mild AI; mildly dilated ascending aorta   (4.3 cm); mild MR; moderate TR with mild pulmonary hypertension.  Labs/Other Tests and Data Reviewed:    EKG:  No ECG reviewed.  Recent Labs: 10/31/2017: ALT 20 06/10/2018: BUN 26; Creatinine, Ser 1.62; Hemoglobin 14.2; Platelets 302; Potassium 4.8; Sodium 141   Recent Lipid  Panel Lab Results  Component Value Date/Time   CHOL 170 10/31/2017 10:14 AM   TRIG 109 10/31/2017 10:14 AM   HDL 61 10/31/2017 10:14 AM   CHOLHDL 2.8 10/31/2017 10:14 AM   CHOLHDL 2.5 09/01/2015 09:54 AM   LDLCALC 87 10/31/2017 10:14 AM    Wt Readings from Last 3 Encounters:  07/15/18 153 lb (69.4 kg)  06/09/18 156 lb (70.8 kg)  04/30/18 152 lb (68.9 kg)     Objective:    Vital Signs:  Ht 5\' 3"  (1.6 m)   Wt 153 lb (69.4 kg)   BMI 27.10 kg/m    VITAL SIGNS:  reviewed GEN:  no acute distress RESPIRATORY:  normal respiratory effort, symmetric expansion NEURO:  alert and oriented x 3, no obvious focal deficit PSYCH:  normal affect  ASSESSMENT & PLAN:    1.  Atrial fibrillation: Was planned for D CCV but was canceled in the setting of atrial paced rhythm on arrival to procedure room.  She continues on sotalol 120 mg twice daily, and diltiazem 120 mg daily along with anticoagulation therapy with Savaysa.  She denies any bleeding, hemoptysis, or excessive bruising.  I will not make any changes in her medication regimen at this time as she is asymptomatic.  2.  Hypotension: The patient has been taking her blood pressure at home and finding it to be low.  Uncertain if this is related to irregular heart rhythm versus actual hypotension related to medications.  She is buying a new blood pressure machine today and will call us with her readings.  May need to consider decreasing furosemide or irbesartan to allow for little higher blood pressure.  This may be the cause of some of her fatigue versus deconditioning.  Will follow.  3. Hypertension : As above will reevaluate once blood pressure has been checked with new machine.  4.  Pacemaker in situ.  Continue remote interrogation per protocol.  5.  Chronic diastolic heart failure: Continues on diuretic as directed.  Weight around 150 pounds.  She is to avoid salt and is been reinforced on this.  She denies significant edema.  COVID-19  Education: The signs and symptoms of COVID-19 were discussed with the patient and how to seek care for testing (follow up with PCP or arrange E-visit).  The importance of social distancing was discussed today.  Time:   Today, I have spent 10 minutes with the patient with telehealth technology discussing the above problems.     Medication Adjustments/Labs and Tests Ordered: Current  medicines are reviewed at length with the patient today.  Concerns regarding medicines are outlined above.   Tests Ordered: No orders of the defined types were placed in this encounter.   Medication Changes: No orders of the defined types were placed in this encounter.   Disposition:  Follow up 6 months   Signed, Bettey Mare. Liborio Nixon, ANP, AACC  07/15/2018 10:15 AM    [facility]

## 2018-07-15 ENCOUNTER — Telehealth: Payer: Self-pay | Admitting: Adult Health

## 2018-07-15 ENCOUNTER — Telehealth (INDEPENDENT_AMBULATORY_CARE_PROVIDER_SITE_OTHER): Payer: Medicare Other | Admitting: Adult Health

## 2018-07-15 VITALS — Ht 63.0 in | Wt 153.0 lb

## 2018-07-15 DIAGNOSIS — I495 Sick sinus syndrome: Secondary | ICD-10-CM

## 2018-07-15 DIAGNOSIS — I10 Essential (primary) hypertension: Secondary | ICD-10-CM

## 2018-07-15 DIAGNOSIS — I4819 Other persistent atrial fibrillation: Secondary | ICD-10-CM

## 2018-07-15 NOTE — Telephone Encounter (Signed)
  Patient got a brand new BP monitor today and K Lawrence asked her at her televisit to call with a update on her BP. She took it and it said 102/67 p 64 irregular heart beat detected.

## 2018-07-15 NOTE — Patient Instructions (Signed)
Medication Instructions:  Continue current medications  If you need a refill on your cardiac medications before your next appointment, please call your pharmacy.  Labwork: None Ordered  Testing/Procedures: None Ordered  Follow-Up: You will need a follow up appointment in 4 months.  Please call our office 2 months in advance to schedule this appointment.  You may see Sanda Klein, MD or one of the following Advanced Practice Providers on your designated Care Team: [place], [place] . Fabian Sharp, PA-C     At [facility], you and your health needs are our priority.  As part of our continuing mission to provide you with exceptional heart care, we have created designated Provider Care Teams.  These Care Teams include your primary Cardiologist (physician) and Advanced Practice Providers (APPs -  Physician Assistants and Nurse Practitioners) who all work together to provide you with the care you need, when you need it.  Thank you for choosing [facility] at [facility]!!

## 2018-07-16 NOTE — Telephone Encounter (Signed)
Please make sure she is weighing daily. If her weight is between 141-144 lbs she can be on 20 mg daily, otherwise continue on 40 mg daily of lasix. Will not make any changes in her medications just now.

## 2018-07-16 NOTE — Telephone Encounter (Signed)
Pt informed of providers result & recommendations. Pt verbalized understanding. No further questions . Mailed pt BP and weight log for her convenience. Pt expresses appreciation.

## 2018-07-16 NOTE — Telephone Encounter (Signed)
Tried to CB pt VM not set up yet

## 2018-07-16 NOTE — Telephone Encounter (Signed)
Tried to CB pt "call cannot be completed as dialed""call cannot be completed as dialed" will have to try again later.

## 2018-07-27 ENCOUNTER — Other Ambulatory Visit: Payer: Self-pay | Admitting: Cardiovascular Disease

## 2018-07-27 ENCOUNTER — Other Ambulatory Visit: Payer: Self-pay | Admitting: Physician Assistant

## 2018-09-03 ENCOUNTER — Encounter: Payer: Medicare Other | Admitting: *Deleted

## 2018-09-11 ENCOUNTER — Encounter: Payer: Self-pay | Admitting: Cardiology

## 2018-09-25 ENCOUNTER — Ambulatory Visit (INDEPENDENT_AMBULATORY_CARE_PROVIDER_SITE_OTHER): Payer: Medicare Other | Admitting: *Deleted

## 2018-09-25 DIAGNOSIS — I495 Sick sinus syndrome: Secondary | ICD-10-CM | POA: Diagnosis not present

## 2018-09-26 LAB — CUP PACEART REMOTE DEVICE CHECK
Battery Remaining Longevity: 52 mo
Battery Voltage: 2.99 V
Brady Statistic AP VP Percent: 0.09 %
Brady Statistic AP VS Percent: 99.74 %
Brady Statistic AS VP Percent: 0 %
Brady Statistic AS VS Percent: 0.17 %
Brady Statistic RA Percent Paced: 99.81 %
Brady Statistic RV Percent Paced: 0.09 %
Date Time Interrogation Session: 20200917212033
Implantable Lead Implant Date: 20141219
Implantable Lead Implant Date: 20141219
Implantable Lead Location: 753859
Implantable Lead Location: 753860
Implantable Lead Model: 5076
Implantable Lead Model: 5076
Implantable Pulse Generator Implant Date: 20141219
Lead Channel Impedance Value: 361 Ohm
Lead Channel Impedance Value: 380 Ohm
Lead Channel Impedance Value: 418 Ohm
Lead Channel Impedance Value: 456 Ohm
Lead Channel Pacing Threshold Amplitude: 0.625 V
Lead Channel Pacing Threshold Amplitude: 0.75 V
Lead Channel Pacing Threshold Pulse Width: 0.4 ms
Lead Channel Pacing Threshold Pulse Width: 0.4 ms
Lead Channel Sensing Intrinsic Amplitude: 3.5 mV
Lead Channel Sensing Intrinsic Amplitude: 3.5 mV
Lead Channel Sensing Intrinsic Amplitude: 6.25 mV
Lead Channel Sensing Intrinsic Amplitude: 6.25 mV
Lead Channel Setting Pacing Amplitude: 2 V
Lead Channel Setting Pacing Amplitude: 2.5 V
Lead Channel Setting Pacing Pulse Width: 0.4 ms
Lead Channel Setting Sensing Sensitivity: 0.9 mV

## 2018-09-29 ENCOUNTER — Encounter: Payer: Self-pay | Admitting: Cardiology

## 2018-09-29 NOTE — Progress Notes (Signed)
Remote pacemaker transmission.   

## 2018-10-09 ENCOUNTER — Other Ambulatory Visit: Payer: Self-pay | Admitting: Cardiovascular Disease

## 2018-10-29 ENCOUNTER — Other Ambulatory Visit: Payer: Self-pay | Admitting: *Deleted

## 2018-10-29 DIAGNOSIS — Z1231 Encounter for screening mammogram for malignant neoplasm of breast: Secondary | ICD-10-CM

## 2018-11-06 ENCOUNTER — Other Ambulatory Visit: Payer: Self-pay

## 2018-11-06 ENCOUNTER — Ambulatory Visit (HOSPITAL_COMMUNITY): Payer: Medicare Other | Attending: Cardiovascular Disease

## 2018-11-06 DIAGNOSIS — Z7901 Long term (current) use of anticoagulants: Secondary | ICD-10-CM

## 2018-11-06 DIAGNOSIS — I4891 Unspecified atrial fibrillation: Secondary | ICD-10-CM

## 2018-11-06 DIAGNOSIS — I10 Essential (primary) hypertension: Secondary | ICD-10-CM

## 2018-11-06 DIAGNOSIS — I480 Paroxysmal atrial fibrillation: Secondary | ICD-10-CM

## 2018-11-06 DIAGNOSIS — I447 Left bundle-branch block, unspecified: Secondary | ICD-10-CM

## 2018-11-06 DIAGNOSIS — E7800 Pure hypercholesterolemia, unspecified: Secondary | ICD-10-CM | POA: Diagnosis not present

## 2018-11-06 DIAGNOSIS — Z950 Presence of cardiac pacemaker: Secondary | ICD-10-CM

## 2018-11-06 DIAGNOSIS — I495 Sick sinus syndrome: Secondary | ICD-10-CM

## 2018-11-06 DIAGNOSIS — I5042 Chronic combined systolic (congestive) and diastolic (congestive) heart failure: Secondary | ICD-10-CM

## 2018-11-17 ENCOUNTER — Telehealth: Payer: Self-pay | Admitting: *Deleted

## 2018-11-17 NOTE — Telephone Encounter (Signed)

## 2018-11-21 ENCOUNTER — Encounter: Payer: Self-pay | Admitting: Cardiovascular Disease

## 2018-11-21 ENCOUNTER — Telehealth (INDEPENDENT_AMBULATORY_CARE_PROVIDER_SITE_OTHER): Payer: Medicare Other | Admitting: Cardiovascular Disease

## 2018-11-21 VITALS — BP 108/64 | HR 72 | Ht 63.0 in | Wt 152.0 lb

## 2018-11-21 DIAGNOSIS — E785 Hyperlipidemia, unspecified: Secondary | ICD-10-CM

## 2018-11-21 DIAGNOSIS — Z5181 Encounter for therapeutic drug level monitoring: Secondary | ICD-10-CM

## 2018-11-21 DIAGNOSIS — I5032 Chronic diastolic (congestive) heart failure: Secondary | ICD-10-CM | POA: Diagnosis not present

## 2018-11-21 DIAGNOSIS — Z7901 Long term (current) use of anticoagulants: Secondary | ICD-10-CM

## 2018-11-21 DIAGNOSIS — I4819 Other persistent atrial fibrillation: Secondary | ICD-10-CM | POA: Diagnosis not present

## 2018-11-21 DIAGNOSIS — I495 Sick sinus syndrome: Secondary | ICD-10-CM

## 2018-11-21 DIAGNOSIS — I952 Hypotension due to drugs: Secondary | ICD-10-CM

## 2018-11-21 DIAGNOSIS — I447 Left bundle-branch block, unspecified: Secondary | ICD-10-CM

## 2018-11-21 DIAGNOSIS — Z79899 Other long term (current) drug therapy: Secondary | ICD-10-CM

## 2018-11-21 DIAGNOSIS — Z950 Presence of cardiac pacemaker: Secondary | ICD-10-CM

## 2018-11-21 MED ORDER — FUROSEMIDE 20 MG PO TABS: 20.0000 mg | ORAL_TABLET | Freq: Every day | ORAL | 5 refills | Status: DC

## 2018-11-21 MED ORDER — POTASSIUM CHLORIDE ER 10 MEQ PO TBCR: 10.0000 meq | EXTENDED_RELEASE_TABLET | Freq: Every day | ORAL | 5 refills | Status: DC

## 2018-11-21 NOTE — Patient Instructions (Signed)
Medication Instructions:  No changes *If you need a refill on your cardiac medications before your next appointment, please call your pharmacy*  Lab Work: Your provider would like for you to return in a few weeks to have the following labs drawn: Fasting lipid, CMET and CBC. You do not need an appointment for the lab. Once in our office lobby there is a podium where you can sign in and ring the doorbell to alert [place] that you are here. The lab is open from 8:00 am to 4:30 pm; closed for lunch from 12:45pm-1:45pm.  If you have labs (blood work) drawn today and your tests are completely normal, you will receive your results only by: Marland Kitchen MyChart Message (if you have MyChart) OR . A paper copy in the mail If you have any lab test that is abnormal or we need to change your treatment, we will call you to review the results.  Testing/Procedures: None ordered  Follow-Up: At [facility], you and your health needs are our priority.  As part of our continuing mission to provide you with exceptional heart care, we have created designated Provider Care Teams.  These Care Teams include your primary Cardiologist (physician) and Advanced Practice Providers (APPs -  Physician Assistants and Nurse Practitioners) who all work together to provide you with the care you need, when you need it.  Your next appointment:   6 months  The format for your next appointment:   In Person  Provider:   Sanda Klein, MD

## 2018-11-21 NOTE — Progress Notes (Signed)
Virtual Visit via Telephone Note   This visit type was conducted due to national recommendations for restrictions regarding the COVID-19 Pandemic (e.g. social distancing) in an effort to limit this patient's exposure and mitigate transmission in our community.  Due to her co-morbid illnesses, this patient is at least at moderate risk for complications without adequate follow up.  This format is felt to be most appropriate for this patient at this time.  The patient did not have access to video technology/had technical difficulties with video requiring transitioning to audio format only (telephone).  All issues noted in this document were discussed and addressed.  No physical exam could be performed with this format.  Please refer to the patient's chart for her  consent to telehealth for [facility].   Date:  11/21/2018   ID:  Monica Harvey, DOB 1940-07-05, MRN [id number]  Patient Location: Home Provider Location: Home  PCP:  Marva Panda, NP  Cardiologist:  Thurmon Fair, MD  Electrophysiologist:  None   Evaluation Performed:  Follow-Up Visit  Chief Complaint:  Dyspnea, edema  History of Present Illness:    Monica Harvey is a 78 y.o. female with longstanding tachycardia-bradycardia syndrome, dual-chamber permanent pacemaker (Medtronic), recurrent episodes of persistent atrial fibrillation on sotalol and direct oral anticoagulant, with previous history of heart failure decompensation due to tachycardia related cardiomyopathy, improved following rhythm correction.  She last underwent cardioversion in June 2020 when she had symptoms of heart failure following an episode of persistent atrial fibrillation.  She feels much better since then.  In fact, she recently had to cut back her dose of diuretic because of problems with dizziness.  The dizziness has now improved.  She never experienced syncope.  The patient specifically denies any chest pain at rest exertion, dyspnea at rest  or with exertion, orthopnea, paroxysmal nocturnal dyspnea, syncope, palpitations, focal neurological deficits, intermittent claudication, lower extremity edema, unexplained weight gain, cough, hemoptysis or wheezing.  Her weight is steady at around 152 pounds.  Her blood pressure tends to run relatively low.  She denies any falls, injuries or bleeding.  She is compliant with sotalol and Savaysa.  She had an echocardiogram performed a couple of weeks ago that shows near normalization of left ventricular systolic function with an EF that is now 50-55%.  Subtle reduction in EF is due to LBBB related dyssynchrony.  EF has improved compared to July 2019 (EF 45-50%).  Diastolic function parameters suggest normal left ventricular filling pressures.  Her last pacemaker download in September 2020 shows virtually 100% atrial paced, ventricular sensed rhythm.  There have been no episodes of atrial fibrillation since the cardioversion.  Activity level is steadily improving and is now up to 3 hours a day.  Lead parameters are excellent.  She tends to have infrequent but lengthy episodes of persistent atrial fibrillation lasting for about a week.  This has occurred 3 times this year including the episode in June that led to heart failure exacerbation requiring cardioversion.  The patient does not have symptoms concerning for COVID-19 infection (fever, chills, cough, or new shortness of breath).    Past Medical History:  Diagnosis Date  . Anginal pain (HCC)   . Chronic anticoagulation, CHAD2Vasc score 4 02/09/2014  . Chronic diastolic CHF (congestive heart failure) (HCC)    a. 01/2014 Echo: EF 50-55%, Gr1 DD, mild AI/MR.  . First degree atrioventricular block   . GERD (gastroesophageal reflux disease)   . Heart murmur   . HTN (hypertension)   .  Hyperlipidemia   . LBBB (left bundle branch block)   . PAF (paroxysmal atrial fibrillation) ([place])    a. Previously on flecainide;  b. 01/2014 sotalol loaded and dccv  performed;  c. Chronic Savaysa Rx.  . Presence of permanent cardiac pacemaker    a. 12/26/12 s/p MDT DC PPM.  . SSS (sick sinus syndrome) ([place])    a. s/p PPM-Medtronic placed 12/26/12   . Symptomatic sinus bradycardia    Past Surgical History:  Procedure Laterality Date  . APPENDECTOMY  1971  . BREAST EXCISIONAL BIOPSY Right   . BREAST LUMPECTOMY Right 1980's?   "benign"  . CARDIOVERSION N/A 01/22/2014   Procedure: CARDIOVERSION;  Surgeon: Pixie Casino, MD;  Location: [place];  Service: Cardiovascular;  Laterality: N/A;  . CARDIOVERSION N/A 04/10/2017   Procedure: CARDIOVERSION;  Surgeon: Jerline Pain, MD;  Location: [place];  Service: Cardiovascular;  Laterality: N/A;  . INSERT / REPLACE / REMOVE PACEMAKER  12/26/2012   Medtronic, Dr. Sallyanne Kuster  . NM MYOCAR PERF WALL MOTION  08/05/2009   small to mod. reversible anteroseptal,septal,& apical perfusion defect. EF 59%.  Marland Kitchen PERMANENT PACEMAKER INSERTION N/A 12/26/2012   Procedure: PERMANENT PACEMAKER INSERTION;  Surgeon: Sanda Klein, MD;  Location: [place] C[facility];  Service: Cardiovascular;  Laterality: N/A;  . TONSILLECTOMY AND ADENOIDECTOMY  1960's  . TUBAL LIGATION  1971  . VAGINAL HYSTERECTOMY  1980's     Current Meds  Medication Sig  . acetaminophen (TYLENOL) 500 MG tablet Take 500 mg by mouth daily as needed for moderate pain or headache.  . albuterol (VENTOLIN HFA) 108 (90 Base) MCG/ACT inhaler Inhale 2 puffs into the lungs every 6 (six) hours as needed for wheezing or shortness of breath.  . diltiazem (CARDIZEM CD) 120 MG 24 hr capsule TAKE 1 CAPSULE DAILY  . edoxaban (SAVAYSA) 30 MG TABS tablet Take 1 tablet (30 mg total) by mouth daily.  . fluticasone (FLONASE) 50 MCG/ACT nasal spray Place 1 spray into both nostrils daily as needed for allergies or rhinitis.  Marland Kitchen irbesartan (AVAPRO) 150 MG tablet TAKE 1 TABLET DAILY  . LIVALO 1 MG TABS TAKE 1 TABLET DAILY  . omeprazole (PRILOSEC) 20 MG capsule Take 20 mg by mouth  daily as needed (acid reflux).  . ondansetron (ZOFRAN ODT) 4 MG disintegrating tablet Take 1 tablet (4 mg total) by mouth every 8 (eight) hours as needed for nausea or vomiting.  . sotalol (BETAPACE) 120 MG tablet TAKE 1 TABLET EVERY 12     HOURS  . [DISCONTINUED] potassium chloride 20 MEQ TBCR Take 20 mEq by mouth daily. (Patient taking differently: Take 10 mEq by mouth daily. )     Allergies:   Codeine   Social History   Tobacco Use  . Smoking status: Never Smoker  . Smokeless tobacco: Never Used  Substance Use Topics  . Alcohol use: No  . Drug use: No     Family Hx: The patient's family history includes Heart attack in her mother; Stroke in her sister.  ROS:   Please see the history of present illness.    All other systems are reviewed and are negative.   Prior CV studies:   The following studies were reviewed today:  Pacemaker download September 26, 2018.  Echocardiogram November 06, 2018    1. Left ventricular ejection fraction, by visual estimation, is 50 to 55%. The left ventricle has normal function. There is mildly increased left ventricular hypertrophy.  2. Left ventricular diastolic parameters are consistent  with Grade I diastolic dysfunction (impaired relaxation).  3. Global right ventricle has normal systolic function.The right ventricular size is normal. No increase in right ventricular wall thickness.  4. Left atrial size was normal.  5. Right atrial size was normal.  6. The mitral valve is normal in structure. Trace mitral valve regurgitation.  7. The tricuspid valve is grossly normal. Tricuspid valve regurgitation is mild.  8. Aortic valve regurgitation is mild to moderate.  9. The aortic valve is normal in structure. Aortic valve regurgitation is mild to moderate. No evidence of aortic valve sclerosis or stenosis. 10. The pulmonic valve was grossly normal. Pulmonic valve regurgitation is trivial. 11. Aortic dilatation noted. 12. There is mild to moderate  dilatation of the ascending aorta measuring 42 mm. 13. Normal pulmonary artery systolic pressure. 14. A pacer wire is visualized. 15. The atrial septum is grossly normal.  Labs/Other Tests and Data Reviewed:    EKG:  An ECG dated 06/13/2018 was personally reviewed today and demonstrated:  Atrial paced, ventricular sensed rhythm with long AV delay and left bundle branch block "PR" interval 222 ms, QRS 144 ms, QTC 490 ms  Recent Labs: 06/10/2018: BUN 26; Creatinine, Ser 1.62; Hemoglobin 14.2; Platelets 302; Potassium 4.8; Sodium 141   Recent Lipid Panel Lab Results  Component Value Date/Time   CHOL 170 10/31/2017 10:14 AM   TRIG 109 10/31/2017 10:14 AM   HDL 61 10/31/2017 10:14 AM   CHOLHDL 2.8 10/31/2017 10:14 AM   CHOLHDL 2.5 09/01/2015 09:54 AM   LDLCALC 87 10/31/2017 10:14 AM    Wt Readings from Last 3 Encounters:  11/21/18 152 lb (68.9 kg)  07/15/18 153 lb (69.4 kg)  06/09/18 156 lb (70.8 kg)     Objective:    Vital Signs:  BP 108/64   Pulse 72   Ht 5\' 3"  (1.6 m)   Wt 152 lb (68.9 kg)   BMI 26.93 kg/m    VITAL SIGNS:  reviewed Unable to examine  ASSESSMENT & PLAN:    1. CHF: She tends to decompensate when she has prolonged episodes of persistent atrial fibrillation, despite good ventricular rate control.  Currently is feeling well, NYHA functional class I.  Had some symptoms of hypovolemia that resolved with reduction in the diuretic dose.  LVEF has normalized. 2. AFib: Asked her to call us promptly if she feels that she is in atrial fibrillation or has recurrent symptoms/signs of heart failure.  We can do an early download and bring her in for cardioversion, hopefully help prevent worsening heart failure events.  CHADSVasc 4-5 (age, gender, HTN, CHF, possible CAD). She has been compliant with the anticoagulation.  3. Anticoagulation: She has not missed any doses of Savaysa in the last 30 days.  Denies bleeding complications. 4. PM: Normal device function.  Remote  downloads every 3 months, additional ad hoc downloads for symptoms. 5. SSS: Heart rate histograms show good rate response sensor settings.  She does not have symptoms of chronotropic incompetence. 6. Hypotension: Symptoms of dizziness resolved by cutting back the dose of diuretic. 7. Sotalol: ECG in June showed QTC 490 ms.  Has never had evidence of ventricular proarrhythmia.  Reminded her of the multiple potential drug-drug interactions.  Needs renal function reevaluated and a metabolic panel every 6 months. 8. HLP:  She has been intolerant of potent statins or higher doses of Livalo.  She does not have clear-cut evidence of coronary or peripheral vascular disease and I think an LDL cholesterol of 100 or  less is acceptable.  Plan to reevaluate her lipid profile.  COVID-19 Education: The signs and symptoms of COVID-19 were discussed with the patient and how to seek care for testing (follow up with PCP or arrange E-visit).  The importance of social distancing was discussed today.  Time:   Today, I have spent 22 minutes with the patient with telehealth technology discussing the above problems.     Medication Adjustments/Labs and Tests Ordered: Current medicines are reviewed at length with the patient today.  Concerns regarding medicines are outlined above.   Tests Ordered: Orders Placed This Encounter  Procedures  . CBC  . Lipid panel  . Comprehensive metabolic panel    Medication Changes: Meds ordered this encounter  Medications  . furosemide (LASIX) 20 MG tablet    Sig: Take 1 tablet (20 mg total) by mouth daily.    Dispense:  30 tablet    Refill:  5    Dose change  . potassium chloride (KLOR-CON) 10 MEQ tablet    Sig: Take 1 tablet (10 mEq total) by mouth daily.    Dispense:  30 tablet    Refill:  5    Dose change   Patient Instructions  Medication Instructions:  No changes *If you need a refill on your cardiac medications before your next appointment, please call your  pharmacy*  Lab Work: Your provider would like for you to return in a few weeks to have the following labs drawn: Fasting lipid, CMET and CBC. You do not need an appointment for the lab. Once in our office lobby there is a podium where you can sign in and ring the doorbell to alert us that you are here. The lab is open from 8:00 am to 4:30 pm; closed for lunch from 12:45pm-1:45pm.  If you have labs (blood work) drawn today and your tests are completely normal, you will receive your results only by: Marland Kitchen. MyChart Message (if you have MyChart) OR . A paper copy in the mail If you have any lab test that is abnormal or we need to change your treatment, we will call you to review the results.  Testing/Procedures: None ordered  Follow-Up: At [facility], you and your health needs are our priority.  As part of our continuing mission to provide you with exceptional heart care, we have created designated Provider Care Teams.  These Care Teams include your primary Cardiologist (physician) and Advanced Practice Providers (APPs -  Physician Assistants and Nurse Practitioners) who all work together to provide you with the care you need, when you need it.  Your next appointment:   6 months  The format for your next appointment:   In Person  Provider:   Thurmon FairMihai Gillermo Poch, MD      Disposition:  Follow up 3 months  Signed, Thurmon FairMihai Ilya Ess, MD  11/21/2018 9:27 AM    [facility]

## 2018-12-01 LAB — COMPREHENSIVE METABOLIC PANEL
ALT: 11 IU/L (ref 0–32)
AST: 16 IU/L (ref 0–40)
Albumin/Globulin Ratio: 2 (ref 1.2–2.2)
Albumin: 4.4 g/dL (ref 3.7–4.7)
Alkaline Phosphatase: 183 IU/L — ABNORMAL HIGH (ref 39–117)
BUN/Creatinine Ratio: 14 (ref 12–28)
BUN: 20 mg/dL (ref 8–27)
Bilirubin Total: 0.5 mg/dL (ref 0.0–1.2)
CO2: 22 mmol/L (ref 20–29)
Calcium: 10 mg/dL (ref 8.7–10.3)
Chloride: 104 mmol/L (ref 96–106)
Creatinine, Ser: 1.39 mg/dL — ABNORMAL HIGH (ref 0.57–1.00)
GFR calc Af Amer: 42 mL/min/{1.73_m2} — ABNORMAL LOW (ref >59)
GFR calc non Af Amer: 37 mL/min/{1.73_m2} — ABNORMAL LOW (ref >59)
Globulin, Total: 2.2 g/dL (ref 1.5–4.5)
Glucose: 87 mg/dL (ref 65–99)
Potassium: 4.6 mmol/L (ref 3.5–5.2)
Sodium: 141 mmol/L (ref 134–144)
Total Protein: 6.6 g/dL (ref 6.0–8.5)

## 2018-12-01 LAB — CBC
Hematocrit: 40.1 % (ref 34.0–46.6)
Hemoglobin: 13.6 g/dL (ref 11.1–15.9)
MCH: 31.3 pg (ref 26.6–33.0)
MCHC: 33.9 g/dL (ref 31.5–35.7)
MCV: 92 fL (ref 79–97)
Platelets: 314 10*3/uL (ref 150–450)
RBC: 4.35 x10E6/uL (ref 3.77–5.28)
RDW: 12.1 % (ref 11.7–15.4)
WBC: 7.1 10*3/uL (ref 3.4–10.8)

## 2018-12-01 LAB — LIPID PANEL
Chol/HDL Ratio: 3.2 ratio (ref 0.0–4.4)
Cholesterol, Total: 194 mg/dL (ref 100–199)
HDL: 60 mg/dL (ref >39)
LDL Chol Calc (NIH): 115 mg/dL — ABNORMAL HIGH (ref 0–99)
Triglycerides: 104 mg/dL (ref 0–149)
VLDL Cholesterol Cal: 19 mg/dL (ref 5–40)

## 2018-12-03 ENCOUNTER — Encounter: Payer: Self-pay | Admitting: *Deleted

## 2018-12-21 ENCOUNTER — Other Ambulatory Visit: Payer: Self-pay | Admitting: Cardiovascular Disease

## 2018-12-22 ENCOUNTER — Other Ambulatory Visit: Payer: Self-pay

## 2018-12-22 MED ORDER — DILTIAZEM HCL ER COATED BEADS 120 MG PO CP24: 120.0000 mg | ORAL_CAPSULE | Freq: Every day | ORAL | 0 refills | Status: DC

## 2018-12-25 ENCOUNTER — Ambulatory Visit: Payer: Medicare Other

## 2018-12-25 ENCOUNTER — Ambulatory Visit (INDEPENDENT_AMBULATORY_CARE_PROVIDER_SITE_OTHER): Payer: Medicare Other | Admitting: *Deleted

## 2018-12-25 DIAGNOSIS — R001 Bradycardia, unspecified: Secondary | ICD-10-CM

## 2018-12-27 LAB — CUP PACEART REMOTE DEVICE CHECK
Battery Remaining Longevity: 45 mo
Battery Voltage: 2.98 V
Brady Statistic AP VP Percent: 0.08 %
Brady Statistic AP VS Percent: 97.98 %
Brady Statistic AS VP Percent: 0.42 %
Brady Statistic AS VS Percent: 1.52 %
Brady Statistic RA Percent Paced: 97.25 %
Brady Statistic RV Percent Paced: 0.54 %
Date Time Interrogation Session: 20201218181724
Implantable Lead Implant Date: 20141219
Implantable Lead Implant Date: 20141219
Implantable Lead Location: 753859
Implantable Lead Location: 753860
Implantable Lead Model: 5076
Implantable Lead Model: 5076
Implantable Pulse Generator Implant Date: 20141219
Lead Channel Impedance Value: 361 Ohm
Lead Channel Impedance Value: 380 Ohm
Lead Channel Impedance Value: 418 Ohm
Lead Channel Impedance Value: 456 Ohm
Lead Channel Pacing Threshold Amplitude: 0.625 V
Lead Channel Pacing Threshold Amplitude: 0.75 V
Lead Channel Pacing Threshold Pulse Width: 0.4 ms
Lead Channel Pacing Threshold Pulse Width: 0.4 ms
Lead Channel Sensing Intrinsic Amplitude: 1.625 mV
Lead Channel Sensing Intrinsic Amplitude: 1.625 mV
Lead Channel Sensing Intrinsic Amplitude: 6.375 mV
Lead Channel Sensing Intrinsic Amplitude: 6.375 mV
Lead Channel Setting Pacing Amplitude: 2 V
Lead Channel Setting Pacing Amplitude: 2.5 V
Lead Channel Setting Pacing Pulse Width: 0.4 ms
Lead Channel Setting Sensing Sensitivity: 0.9 mV

## 2018-12-31 NOTE — Telephone Encounter (Signed)
completed

## 2019-02-11 ENCOUNTER — Other Ambulatory Visit: Payer: Self-pay | Admitting: Cardiovascular Disease

## 2019-02-11 ENCOUNTER — Other Ambulatory Visit: Payer: Self-pay | Admitting: Physician Assistant

## 2019-03-26 ENCOUNTER — Ambulatory Visit (INDEPENDENT_AMBULATORY_CARE_PROVIDER_SITE_OTHER): Payer: Medicare Other | Admitting: *Deleted

## 2019-03-26 DIAGNOSIS — R001 Bradycardia, unspecified: Secondary | ICD-10-CM | POA: Diagnosis not present

## 2019-03-27 LAB — CUP PACEART REMOTE DEVICE CHECK
Battery Remaining Longevity: 36 mo
Battery Voltage: 2.97 V
Brady Statistic AP VP Percent: 0.11 %
Brady Statistic AP VS Percent: 71.71 %
Brady Statistic AS VP Percent: 9.29 %
Brady Statistic AS VS Percent: 18.9 %
Brady Statistic RA Percent Paced: 63.89 %
Brady Statistic RV Percent Paced: 10.07 %
Date Time Interrogation Session: 20210319100621
Implantable Lead Implant Date: 20141219
Implantable Lead Implant Date: 20141219
Implantable Lead Location: 753859
Implantable Lead Location: 753860
Implantable Lead Model: 5076
Implantable Lead Model: 5076
Implantable Pulse Generator Implant Date: 20141219
Lead Channel Impedance Value: 342 Ohm
Lead Channel Impedance Value: 380 Ohm
Lead Channel Impedance Value: 399 Ohm
Lead Channel Impedance Value: 437 Ohm
Lead Channel Pacing Threshold Amplitude: 0.75 V
Lead Channel Pacing Threshold Amplitude: 0.875 V
Lead Channel Pacing Threshold Pulse Width: 0.4 ms
Lead Channel Pacing Threshold Pulse Width: 0.4 ms
Lead Channel Sensing Intrinsic Amplitude: 3.375 mV
Lead Channel Sensing Intrinsic Amplitude: 3.375 mV
Lead Channel Sensing Intrinsic Amplitude: 5.625 mV
Lead Channel Sensing Intrinsic Amplitude: 5.625 mV
Lead Channel Setting Pacing Amplitude: 2 V
Lead Channel Setting Pacing Amplitude: 2.5 V
Lead Channel Setting Pacing Pulse Width: 0.4 ms
Lead Channel Setting Sensing Sensitivity: 0.9 mV

## 2019-04-30 ENCOUNTER — Other Ambulatory Visit: Payer: Self-pay | Admitting: Cardiovascular Disease

## 2019-05-01 NOTE — Telephone Encounter (Signed)
Rx(s) sent to pharmacy electronically.  

## 2019-06-17 ENCOUNTER — Other Ambulatory Visit: Payer: Self-pay | Admitting: Physician Assistant

## 2019-06-17 ENCOUNTER — Other Ambulatory Visit: Payer: Self-pay | Admitting: Cardiovascular Disease

## 2019-06-18 NOTE — Telephone Encounter (Signed)
This is Dr. Croitoru's pt 

## 2019-06-22 ENCOUNTER — Other Ambulatory Visit: Payer: Self-pay

## 2019-06-22 ENCOUNTER — Ambulatory Visit (INDEPENDENT_AMBULATORY_CARE_PROVIDER_SITE_OTHER): Payer: Medicare Other | Admitting: Cardiovascular Disease

## 2019-06-22 ENCOUNTER — Encounter: Payer: Self-pay | Admitting: Cardiovascular Disease

## 2019-06-22 VITALS — BP 100/80 | HR 84 | Ht 63.0 in | Wt 152.0 lb

## 2019-06-22 DIAGNOSIS — Z5181 Encounter for therapeutic drug level monitoring: Secondary | ICD-10-CM

## 2019-06-22 DIAGNOSIS — I480 Paroxysmal atrial fibrillation: Secondary | ICD-10-CM | POA: Diagnosis not present

## 2019-06-22 DIAGNOSIS — Z950 Presence of cardiac pacemaker: Secondary | ICD-10-CM | POA: Diagnosis not present

## 2019-06-22 DIAGNOSIS — Z79899 Other long term (current) drug therapy: Secondary | ICD-10-CM

## 2019-06-22 DIAGNOSIS — I952 Hypotension due to drugs: Secondary | ICD-10-CM

## 2019-06-22 DIAGNOSIS — E7800 Pure hypercholesterolemia, unspecified: Secondary | ICD-10-CM

## 2019-06-22 DIAGNOSIS — I5032 Chronic diastolic (congestive) heart failure: Secondary | ICD-10-CM

## 2019-06-22 DIAGNOSIS — I495 Sick sinus syndrome: Secondary | ICD-10-CM

## 2019-06-22 DIAGNOSIS — Z7901 Long term (current) use of anticoagulants: Secondary | ICD-10-CM

## 2019-06-22 LAB — COMPREHENSIVE METABOLIC PANEL
ALT: 22 IU/L (ref 0–32)
AST: 20 IU/L (ref 0–40)
Albumin/Globulin Ratio: 2.1 (ref 1.2–2.2)
Albumin: 4.8 g/dL — ABNORMAL HIGH (ref 3.7–4.7)
Alkaline Phosphatase: 176 IU/L — ABNORMAL HIGH (ref 48–121)
BUN/Creatinine Ratio: 15 (ref 12–28)
BUN: 18 mg/dL (ref 8–27)
Bilirubin Total: 0.7 mg/dL (ref 0.0–1.2)
CO2: 20 mmol/L (ref 20–29)
Calcium: 10.4 mg/dL — ABNORMAL HIGH (ref 8.7–10.3)
Chloride: 105 mmol/L (ref 96–106)
Creatinine, Ser: 1.23 mg/dL — ABNORMAL HIGH (ref 0.57–1.00)
GFR calc Af Amer: 49 mL/min/{1.73_m2} — ABNORMAL LOW (ref >59)
GFR calc non Af Amer: 42 mL/min/{1.73_m2} — ABNORMAL LOW (ref >59)
Globulin, Total: 2.3 g/dL (ref 1.5–4.5)
Glucose: 92 mg/dL (ref 65–99)
Potassium: 4.9 mmol/L (ref 3.5–5.2)
Sodium: 140 mmol/L (ref 134–144)
Total Protein: 7.1 g/dL (ref 6.0–8.5)

## 2019-06-22 LAB — PACEMAKER DEVICE OBSERVATION

## 2019-06-22 LAB — LIPID PANEL
Chol/HDL Ratio: 2.7 ratio (ref 0.0–4.4)
Cholesterol, Total: 152 mg/dL (ref 100–199)
HDL: 56 mg/dL (ref >39)
LDL Chol Calc (NIH): 79 mg/dL (ref 0–99)
Triglycerides: 88 mg/dL (ref 0–149)
VLDL Cholesterol Cal: 17 mg/dL (ref 5–40)

## 2019-06-22 MED ORDER — IRBESARTAN 150 MG PO TABS: 150.0000 mg | ORAL_TABLET | Freq: Every day | ORAL | 1 refills | Status: DC

## 2019-06-22 MED ORDER — DILTIAZEM HCL ER COATED BEADS 120 MG PO CP24: 120.0000 mg | ORAL_CAPSULE | Freq: Every day | ORAL | 1 refills | Status: DC

## 2019-06-22 MED ORDER — FUROSEMIDE 20 MG PO TABS: 20.0000 mg | ORAL_TABLET | Freq: Every day | ORAL | 5 refills | Status: DC

## 2019-06-22 MED ORDER — POTASSIUM CHLORIDE ER 10 MEQ PO TBCR: 10.0000 meq | EXTENDED_RELEASE_TABLET | Freq: Every day | ORAL | 5 refills | Status: DC

## 2019-06-22 MED ORDER — LIVALO 1 MG PO TABS: 1.0000 | ORAL_TABLET | Freq: Every day | ORAL | 1 refills | Status: DC

## 2019-06-22 MED ORDER — SOTALOL HCL 120 MG PO TABS: 120.0000 mg | ORAL_TABLET | Freq: Two times a day (BID) | ORAL | 1 refills | Status: DC

## 2019-06-22 MED ORDER — EDOXABAN TOSYLATE 30 MG PO TABS: 30.0000 mg | ORAL_TABLET | Freq: Every day | ORAL | 1 refills | Status: DC

## 2019-06-22 NOTE — Progress Notes (Signed)
Cardiology office note    Date:  06/22/2019   ID:  Monica Harvey, DOB 05-25-1940, MRN [id number]  Patient Location: Home Provider Location: Home  PCP:  Marva Panda, NP  Cardiologist:  Thurmon Fair, MD  Electrophysiologist:  None   Evaluation Performed:  Follow-Up Visit  Chief Complaint:  Dyspnea, edema  History of Present Illness:    Monica Harvey is a 79 y.o. female with longstanding tachycardia-bradycardia syndrome, dual-chamber permanent pacemaker (Medtronic), recurrent episodes of persistent atrial fibrillation on sotalol and direct oral anticoagulant, with previous history of heart failure decompensation due to tachycardia related cardiomyopathy, improved following rhythm correction.  She last underwent cardioversion in June 2020 when she had symptoms of heart failure following an episode of persistent atrial fibrillation.  Since then she has had several episodes of paroxysmal atrial fibrillation, some of which have been lengthy (up to 16 days), all of which have resolved spontaneously.  She seems to tolerate the atrial fibrillation better now.  In fact, today she presents in atrial fibrillation which has been ongoing for 14 days, without clear symptom exacerbation.  Rate control is fair.  Her weight has been very steady at around 53 pounds.  She reports having some fatigue, but it is hard to say but did seem worse over the last 2 weeks.  She does not have palpitations and has not experienced syncope or chest discomfort.  She does not have orthopnea, PND or leg edema.  Other than the ongoing episode of atrial fibrillation she has normal pacemaker findings.  Estimated generator longevity is 2.5 years.  She has 88% atrial pacing (when not in atrial fibrillation she is "atrially dependent").  She only has three-point percent ventricular pacing.  The patient specifically denies any chest pain at rest exertion, dyspnea at rest or with exertion, orthopnea, paroxysmal  nocturnal dyspnea, syncope, palpitations, focal neurological deficits, intermittent claudication, lower extremity edema, unexplained weight gain, cough, hemoptysis or wheezing.  She has been compliant with anticoagulation.  She has not had falls, injuries or bleeding problems.  No neurological complaints.  Her most recent echocardiogram shows near normalization of left ventricular systolic function with an EF that is now 50-55%.  Subtle reduction in EF is due to LBBB related dyssynchrony.  EF has improved compared to July 2019 (EF 45-50%).  Diastolic function parameters suggest normal left ventricular filling pressures.   Past Medical History:  Diagnosis Date  . Anginal pain (HCC)   . Chronic anticoagulation, CHAD2Vasc score 4 02/09/2014  . Chronic diastolic CHF (congestive heart failure) (HCC)    a. 01/2014 Echo: EF 50-55%, Gr1 DD, mild AI/MR.  . First degree atrioventricular block   . GERD (gastroesophageal reflux disease)   . Heart murmur   . HTN (hypertension)   . Hyperlipidemia   . LBBB (left bundle branch block)   . PAF (paroxysmal atrial fibrillation) (HCC)    a. Previously on flecainide;  b. 01/2014 sotalol loaded and dccv performed;  c. Chronic Savaysa Rx.  . Presence of permanent cardiac pacemaker    a. 12/26/12 s/p MDT DC PPM.  . SSS (sick sinus syndrome) (HCC)    a. s/p PPM-Medtronic placed 12/26/12   . Symptomatic sinus bradycardia    Past Surgical History:  Procedure Laterality Date  . APPENDECTOMY  1971  . BREAST EXCISIONAL BIOPSY Right   . BREAST LUMPECTOMY Right 1980's?   "benign"  . CARDIOVERSION N/A 01/22/2014   Procedure: CARDIOVERSION;  Surgeon: Chrystie Nose, MD;  Location: [facility] ENDOSCOPY;  Service:  Cardiovascular;  Laterality: N/A;  . CARDIOVERSION N/A 04/10/2017   Procedure: CARDIOVERSION;  Surgeon: Jake Bathe, MD;  Location: [facility] ENDOSCOPY;  Service: Cardiovascular;  Laterality: N/A;  . INSERT / REPLACE / REMOVE PACEMAKER  12/26/2012   Medtronic, Dr.  Royann Shivers  . NM MYOCAR PERF WALL MOTION  08/05/2009   small to mod. reversible anteroseptal,septal,& apical perfusion defect. EF 59%.  Marland Kitchen PERMANENT PACEMAKER INSERTION N/A 12/26/2012   Procedure: PERMANENT PACEMAKER INSERTION;  Surgeon: Thurmon Fair, MD;  Location: [facility] CATH LAB;  Service: Cardiovascular;  Laterality: N/A;  . TONSILLECTOMY AND ADENOIDECTOMY  1960's  . TUBAL LIGATION  1971  . VAGINAL HYSTERECTOMY  1980's     Current Meds  Medication Sig  . acetaminophen (TYLENOL) 500 MG tablet Take 500 mg by mouth daily as needed for moderate pain or headache.  . albuterol (VENTOLIN HFA) 108 (90 Base) MCG/ACT inhaler Inhale 2 puffs into the lungs every 6 (six) hours as needed for wheezing or shortness of breath.  . diltiazem (CARDIZEM CD) 120 MG 24 hr capsule Take 1 capsule (120 mg total) by mouth daily.  Marland Kitchen edoxaban (SAVAYSA) 30 MG TABS tablet Take 1 tablet (30 mg total) by mouth daily.  . fluticasone (FLONASE) 50 MCG/ACT nasal spray Place 1 spray into both nostrils daily as needed for allergies or rhinitis.  Marland Kitchen irbesartan (AVAPRO) 150 MG tablet Take 1 tablet (150 mg total) by mouth daily.  Marland Kitchen omeprazole (PRILOSEC) 20 MG capsule Take 20 mg by mouth daily as needed (acid reflux).  . ondansetron (ZOFRAN ODT) 4 MG disintegrating tablet Take 1 tablet (4 mg total) by mouth every 8 (eight) hours as needed for nausea or vomiting.  . Pitavastatin Calcium (LIVALO) 1 MG TABS Take 1 tablet (1 mg total) by mouth daily.  . sotalol (BETAPACE) 120 MG tablet Take 1 tablet (120 mg total) by mouth every 12 (twelve) hours.  . [DISCONTINUED] diltiazem (CARDIZEM CD) 120 MG 24 hr capsule Take 1 capsule (120 mg total) by mouth daily.  . [DISCONTINUED] edoxaban (SAVAYSA) 30 MG TABS tablet Take 1 tablet (30 mg total) by mouth daily.  . [DISCONTINUED] irbesartan (AVAPRO) 150 MG tablet TAKE 1 TABLET DAILY  . [DISCONTINUED] LIVALO 1 MG TABS TAKE 1 TABLET DAILY  . [DISCONTINUED] sotalol (BETAPACE) 120 MG tablet TAKE 1 TABLET  EVERY 12     HOURS     Allergies:   Codeine   Social History   Tobacco Use  . Smoking status: Never Smoker  . Smokeless tobacco: Never Used  Vaping Use  . Vaping Use: Never used  Substance Use Topics  . Alcohol use: No  . Drug use: No     Family Hx: The patient's family history includes Heart attack in her mother; Stroke in her sister.  ROS:   Please see the history of present illness.    All other systems are reviewed and are negative.   Prior CV studies:   The following studies were reviewed today:  Comprehensive pacemaker check today.    Echocardiogram November 06, 2018  1. Left ventricular ejection fraction, by visual estimation, is 50 to 55%. The left ventricle has normal function. There is mildly increased left ventricular hypertrophy.  2. Left ventricular diastolic parameters are consistent with Grade I diastolic dysfunction (impaired relaxation).  3. Global right ventricle has normal systolic function.The right ventricular size is normal. No increase in right ventricular wall thickness.  4. Left atrial size was normal.  5. Right atrial size was normal.  6.  The mitral valve is normal in structure. Trace mitral valve regurgitation.  7. The tricuspid valve is grossly normal. Tricuspid valve regurgitation is mild.  8. Aortic valve regurgitation is mild to moderate.  9. The aortic valve is normal in structure. Aortic valve regurgitation is mild to moderate. No evidence of aortic valve sclerosis or stenosis. 10. The pulmonic valve was grossly normal. Pulmonic valve regurgitation is trivial. 11. Aortic dilatation noted. 12. There is mild to moderate dilatation of the ascending aorta measuring 42 mm. 13. Normal pulmonary artery systolic pressure. 14. A pacer wire is visualized. 15. The atrial septum is grossly normal.  Labs/Other Tests and Data Reviewed:    EKG:  ECG performed today shows rate-controlled atrial fibrillation with occasional ventricular pacing and old  left bundle branch block.  QTc is 505 ms (QRS 122 ms)  Recent Labs: 12/01/2018: ALT 11; BUN 20; Creatinine, Ser 1.39; Hemoglobin 13.6; Platelets 314; Potassium 4.6; Sodium 141   Recent Lipid Panel Lab Results  Component Value Date/Time   CHOL 194 12/01/2018 08:26 AM   TRIG 104 12/01/2018 08:26 AM   HDL 60 12/01/2018 08:26 AM   CHOLHDL 3.2 12/01/2018 08:26 AM   CHOLHDL 2.5 09/01/2015 09:54 AM   LDLCALC 115 (H) 12/01/2018 08:26 AM    Wt Readings from Last 3 Encounters:  06/22/19 152 lb (68.9 kg)  11/21/18 152 lb (68.9 kg)  07/15/18 153 lb (69.4 kg)     Objective:    Vital Signs:  BP 100/80 (BP Location: Left Arm, Patient Position: Sitting, Cuff Size: Normal)   Pulse 84   Ht 5\' 3"  (1.6 m)   Wt 152 lb (68.9 kg)   BMI 26.93 kg/m     General: Alert, oriented x3, no distress, healthy left subclavian pacemaker site Head: no evidence of trauma, PERRL, EOMI, no exophtalmos or lid lag, no myxedema, no xanthelasma; normal ears, nose and oropharynx Neck: normal jugular venous pulsations and no hepatojugular reflux; brisk carotid pulses without delay and no carotid bruits Chest: clear to auscultation, no signs of consolidation by percussion or palpation, normal fremitus, symmetrical and full respiratory excursions Cardiovascular: normal position and quality of the apical impulse, irregular rhythm, normal first and second heart sounds, no murmurs, rubs or gallops Abdomen: no tenderness or distention, no masses by palpation, no abnormal pulsatility or arterial bruits, normal bowel sounds, no hepatosplenomegaly Extremities: no clubbing, cyanosis or edema; 2+ radial, ulnar and brachial pulses bilaterally; 2+ right femoral, posterior tibial and dorsalis pedis pulses; 2+ left femoral, posterior tibial and dorsalis pedis pulses; no subclavian or femoral bruits Neurological: grossly nonfocal Psych: Normal mood and affect   ASSESSMENT & PLAN:    1. Chronic diastolic heart failure ([place])   2.  PAF (paroxysmal atrial fibrillation) ([place])   3. Long term current use of anticoagulant   4. Pacemaker- MDT implanted 12/26/12   5. SSS (sick sinus syndrome) (HCC)   6. Drug-induced hypotension   7. Encounter for monitoring sotalol therapy   8. Pure hypercholesterolemia      1. CHF: Compared to years past, she now seems to tolerate lengthy episodes of atrial fibrillation (2 weeks or longer) without necessarily developing heart failure.  She is in atrial fibrillation right now, well rate controlled, without signs or symptoms of heart failure.  She has virtually normal left ventricular systolic function (mild LBBB related dyssynchrony).  She is receiving a "beta-blocker" (sotalol) and angiotensin receptor blocker.  She is taking a tiny dose of loop diuretic. 2. AFib: The burden of  atrial fibrillation is increased substantially, to almost 30% of the time.  She has had at least 3 episodes of lengthy sustained arrhythmia without heart failure exacerbation in the last 6 months.  The case for repeat cardioversion now appears to be less compelling.  CHADSVasc 4-5 (age, gender, HTN, CHF, possible CAD). She has been compliant with the anticoagulation.  We will continue sotalol for the time being.  We discussed 3 possible problems with progression.  She may shift to asymptomatic permanent atrial fibrillation which was managed to rhythm control (stop sotalol and start a conventional beta-blocker).  She may have persistent atrial fibrillation that eventually leads to congestive heart failure, in which case I would recommend stopping the sotalol and replacing it with amiodarone followed by cardioversion.  Family is possible that this current episode of atrial fibrillation will resolve spontaneously, just like several other episodes in the last few months, in which case we would continue the same treatment plan. 3. Anticoagulation: Compliant with the Savaysa, no bleeding complications. 4. PM: Continue remote downloads  every 3 months. 5. SSS: Atrially dependent" less of an issue she develops longstanding persistent atrial fibrillation. 6. Hypotension: Asymptomatic since we reduced her dose of diuretic. 7. Sotalol: Due for repeat potassium and creatinine checks.  QTc borderline at 505 ms.  We cannot increase the dose of sotalol.  If continued aggressive rhythm control management is planned, may have to switch to amiodarone. 8. HLP:  She has not tolerated potent statins or higher doses of pitavastatin.  Since she does not have significant CAD or PAD, and LDL cholesterol less than 1 was satisfactory.  Recheck today.   COVID-19 Education: The signs and symptoms of COVID-19 were discussed with the patient and how to seek care for testing (follow up with PCP or arrange E-visit).  The importance of social distancing was discussed today.  Time:   Today, I have spent 22 minutes with the patient with telehealth technology discussing the above problems.     Medication Adjustments/Labs and Tests Ordered: Current medicines are reviewed at length with the patient today.  Concerns regarding medicines are outlined above.   Tests Ordered: Orders Placed This Encounter  Procedures  . Comprehensive metabolic panel  . Lipid panel  . EKG 12-Lead    Medication Changes: Meds ordered this encounter  Medications  . diltiazem (CARDIZEM CD) 120 MG 24 hr capsule    Sig: Take 1 capsule (120 mg total) by mouth daily.    Dispense:  90 capsule    Refill:  1  . edoxaban (SAVAYSA) 30 MG TABS tablet    Sig: Take 1 tablet (30 mg total) by mouth daily.    Dispense:  90 tablet    Refill:  1  . potassium chloride (KLOR-CON) 10 MEQ tablet    Sig: Take 1 tablet (10 mEq total) by mouth daily.    Dispense:  30 tablet    Refill:  5    Dose change  . sotalol (BETAPACE) 120 MG tablet    Sig: Take 1 tablet (120 mg total) by mouth every 12 (twelve) hours.    Dispense:  180 tablet    Refill:  1  . irbesartan (AVAPRO) 150 MG tablet     Sig: Take 1 tablet (150 mg total) by mouth daily.    Dispense:  90 tablet    Refill:  1  . Pitavastatin Calcium (LIVALO) 1 MG TABS    Sig: Take 1 tablet (1 mg total) by mouth daily.  Dispense:  90 tablet    Refill:  1  . furosemide (LASIX) 20 MG tablet    Sig: Take 1 tablet (20 mg total) by mouth daily.    Dispense:  30 tablet    Refill:  5    Dose change   Patient Instructions  Medication Instructions:  No changes If you need a refill on your cardiac medications before your next appointment, please call your pharmacy.   Lab work: Your provider would like for you to have the following labs today: CMET and Fasting Lipid  If you have labs (blood work) drawn today and your tests are completely normal, you will receive your results only by: MyChart Message (if you have MyChart) OR A paper copy in the mail If you have any lab test that is abnormal or we need to change your treatment, we will call you to review the results.  Testing/Procedures: None ordered  Follow-Up: At [facility], you and your health needs are our priority.  As part of our continuing mission to provide you with exceptional heart care, we have created designated Provider Care Teams.  These Care Teams include your primary Cardiologist (physician) and Advanced Practice Providers (APPs -  Physician Assistants and Nurse Practitioners) who all work together to provide you with the care you need, when you need it.  We recommend signing up for the patient portal called "MyChart".  Sign up information is provided on this After Visit Summary.  MyChart is used to connect with patients for Virtual Visits (Telemedicine).  Patients are able to view lab/test results, encounter notes, upcoming appointments, etc.  Non-urgent messages can be sent to your provider as well.   To learn more about what you can do with MyChart, go to [email or web address].    Your next appointment:   3 month(s)  The format for your next  appointment:   In Person  Provider:   Thurmon Fair, MD   Remote monitoring is used to monitor your pacemaker from home. This monitoring reduces the number of office visits required to check your device to one time per year. It allows [place] to keep an eye on the functioning of your device to ensure it is working properly. You are scheduled for a device check from home on 06/25/19. You may send your transmission at any time that day. If you have a wireless device, the transmission will be sent automatically.       Disposition:  Follow up 3 months  Signed, Thurmon Fair, MD  06/22/2019 11:21 AM     [facility]

## 2019-06-22 NOTE — Patient Instructions (Signed)
Medication Instructions:  No changes If you need a refill on your cardiac medications before your next appointment, please call your pharmacy.   Lab work: Your provider would like for you to have the following labs today: CMET and Fasting Lipid  If you have labs (blood work) drawn today and your tests are completely normal, you will receive your results only by: MyChart Message (if you have MyChart) OR A paper copy in the mail If you have any lab test that is abnormal or we need to change your treatment, we will call you to review the results.  Testing/Procedures: None ordered  Follow-Up: At [facility], you and your health needs are our priority.  As part of our continuing mission to provide you with exceptional heart care, we have created designated Provider Care Teams.  These Care Teams include your primary Cardiologist (physician) and Advanced Practice Providers (APPs -  Physician Assistants and Nurse Practitioners) who all work together to provide you with the care you need, when you need it.  We recommend signing up for the patient portal called "MyChart".  Sign up information is provided on this After Visit Summary.  MyChart is used to connect with patients for Virtual Visits (Telemedicine).  Patients are able to view lab/test results, encounter notes, upcoming appointments, etc.  Non-urgent messages can be sent to your provider as well.   To learn more about what you can do with MyChart, go to [email or web address].    Your next appointment:   3 month(s)  The format for your next appointment:   In Person  Provider:   Thurmon Fair, MD   Remote monitoring is used to monitor your pacemaker from home. This monitoring reduces the number of office visits required to check your device to one time per year. It allows [place] to keep an eye on the functioning of your device to ensure it is working properly. You are scheduled for a device check from home on 06/25/19. You may send your  transmission at any time that day. If you have a wireless device, the transmission will be sent automatically.

## 2019-06-24 ENCOUNTER — Encounter: Payer: Self-pay | Admitting: *Deleted

## 2019-06-25 ENCOUNTER — Ambulatory Visit (INDEPENDENT_AMBULATORY_CARE_PROVIDER_SITE_OTHER): Payer: Medicare Other | Admitting: *Deleted

## 2019-06-25 DIAGNOSIS — I495 Sick sinus syndrome: Secondary | ICD-10-CM | POA: Diagnosis not present

## 2019-06-26 LAB — CUP PACEART REMOTE DEVICE CHECK
Battery Remaining Longevity: 33 mo
Battery Voltage: 2.96 V
Brady Statistic AP VP Percent: 0.16 %
Brady Statistic AP VS Percent: 0.02 %
Brady Statistic AS VP Percent: 35.22 %
Brady Statistic AS VS Percent: 64.6 %
Brady Statistic RA Percent Paced: 0.13 %
Brady Statistic RV Percent Paced: 37.05 %
Date Time Interrogation Session: 20210618110256
Implantable Lead Implant Date: 20141219
Implantable Lead Implant Date: 20141219
Implantable Lead Location: 753859
Implantable Lead Location: 753860
Implantable Lead Model: 5076
Implantable Lead Model: 5076
Implantable Pulse Generator Implant Date: 20141219
Lead Channel Impedance Value: 342 Ohm
Lead Channel Impedance Value: 361 Ohm
Lead Channel Impedance Value: 399 Ohm
Lead Channel Impedance Value: 437 Ohm
Lead Channel Pacing Threshold Amplitude: 0.625 V
Lead Channel Pacing Threshold Amplitude: 0.75 V
Lead Channel Pacing Threshold Pulse Width: 0.4 ms
Lead Channel Pacing Threshold Pulse Width: 0.4 ms
Lead Channel Sensing Intrinsic Amplitude: 1.125 mV
Lead Channel Sensing Intrinsic Amplitude: 1.125 mV
Lead Channel Sensing Intrinsic Amplitude: 5.25 mV
Lead Channel Sensing Intrinsic Amplitude: 5.25 mV
Lead Channel Setting Pacing Amplitude: 2 V
Lead Channel Setting Pacing Amplitude: 2.5 V
Lead Channel Setting Pacing Pulse Width: 0.4 ms
Lead Channel Setting Sensing Sensitivity: 0.9 mV

## 2019-06-29 NOTE — Progress Notes (Signed)
Remote pacemaker transmission.   

## 2019-07-25 IMAGING — CR DG CHEST 2V
2 series · 2 of 2 positions shown · non-contrast
Comparison: 04/20/2016

CLINICAL DATA: Shortness of breath

EXAM:
CHEST - 2 VIEW

[chest pa]
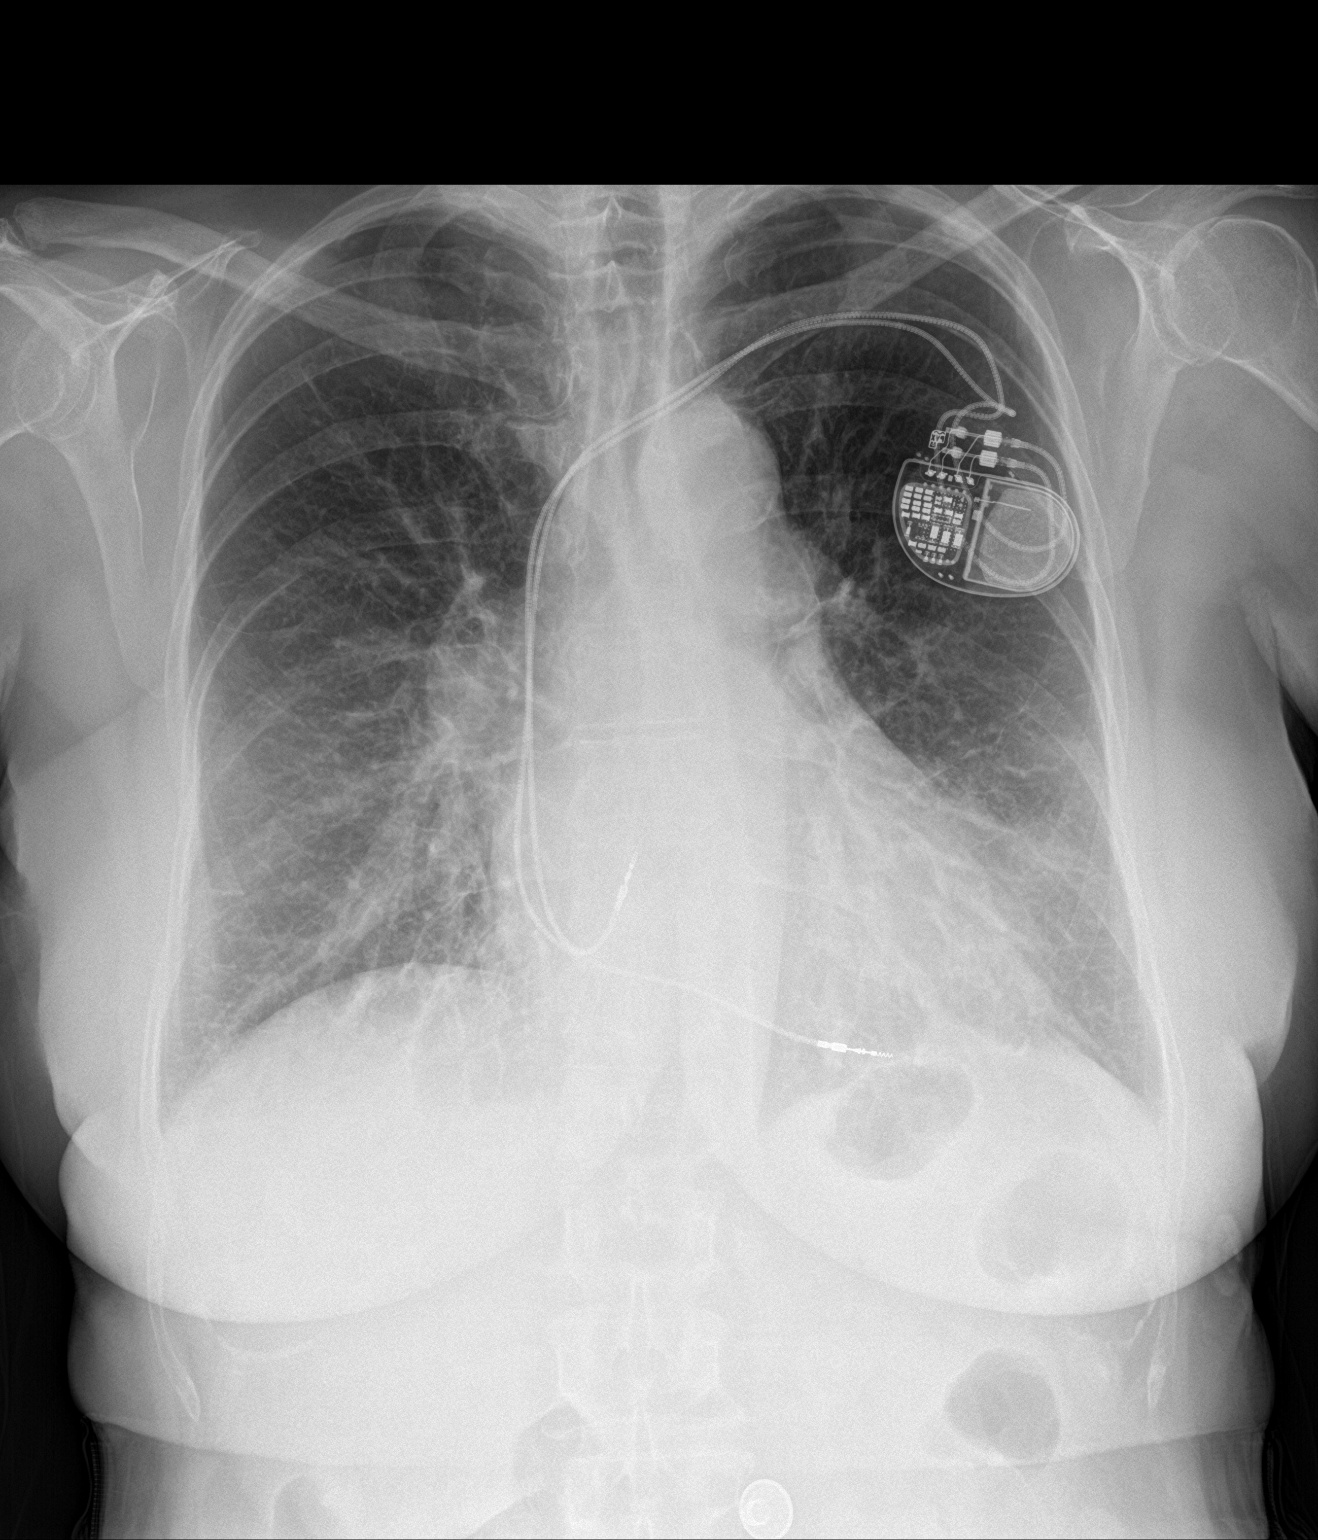

[chest lat]
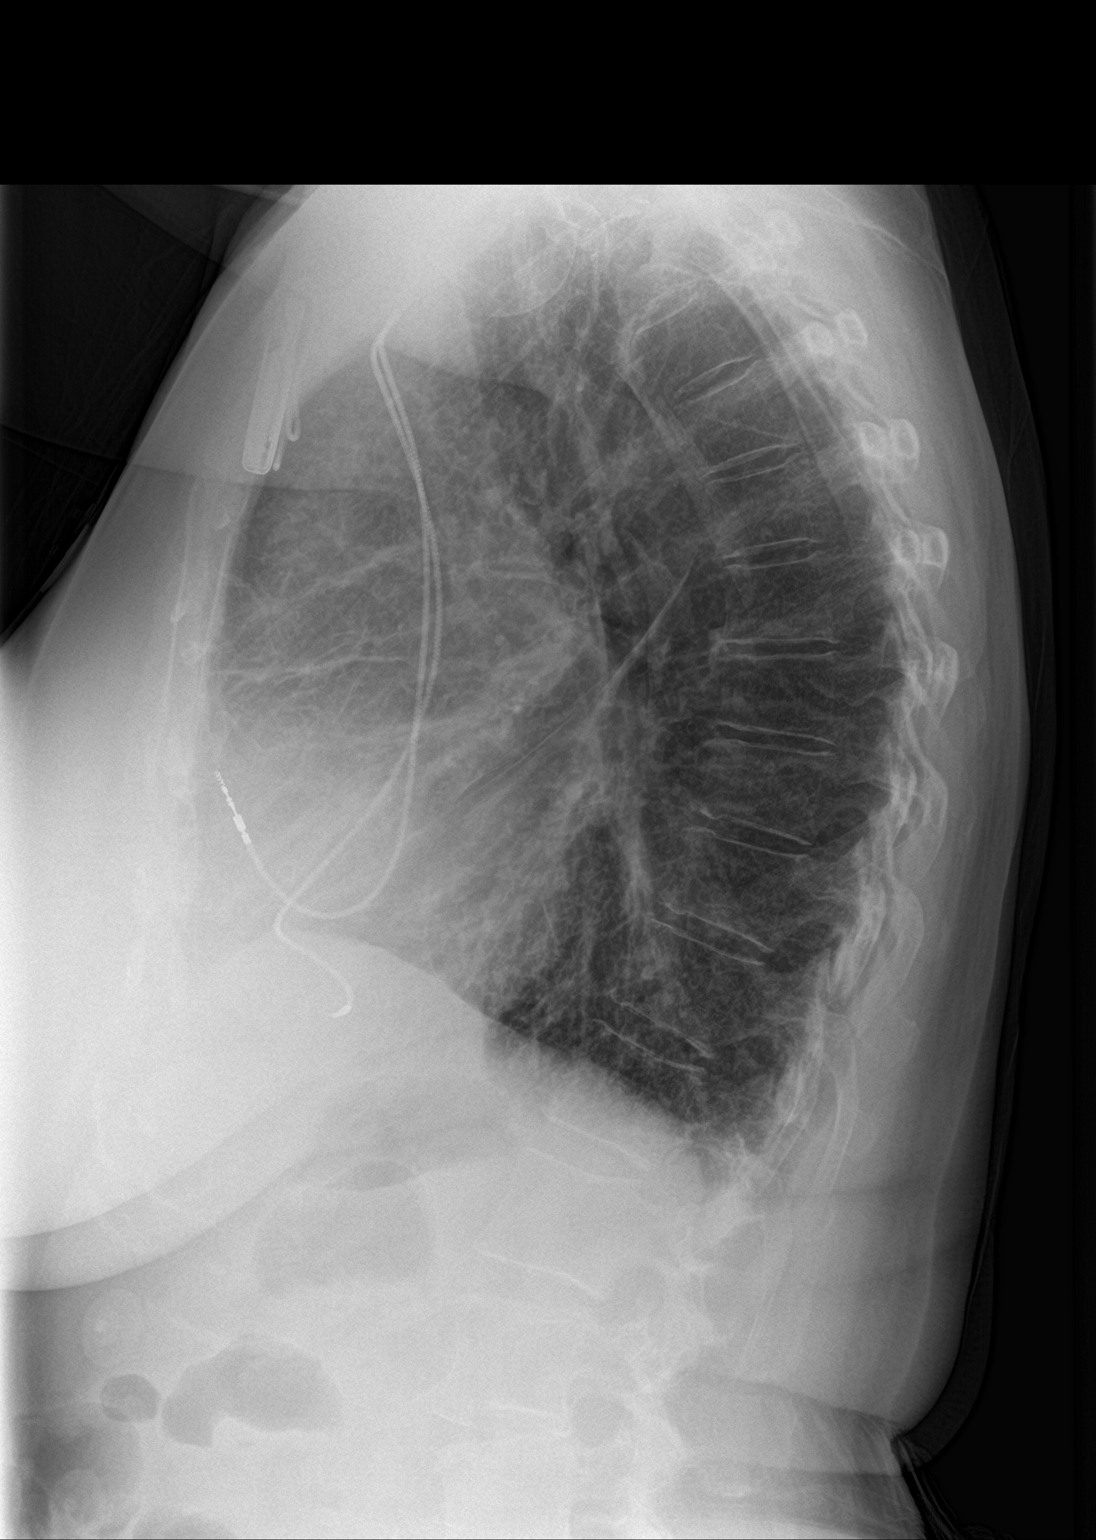

[2 of 2 positions shown; findings below may reference images not displayed]

FINDINGS: Bilateral interstitial thickening. No focal consolidation, pleural
effusion or pneumothorax. Stable mild cardiomegaly. Dual lead
cardiac pacemaker. Thoracic aortic atherosclerosis. Dual lead
cardiac pacemaker.
IMPRESSION: Cardiomegaly with mild pulmonary vascular congestion.

## 2019-08-04 ENCOUNTER — Other Ambulatory Visit: Payer: Self-pay | Admitting: Cardiovascular Disease

## 2019-08-17 ENCOUNTER — Other Ambulatory Visit: Payer: Self-pay | Admitting: Orthopedic Surgery

## 2019-08-17 DIAGNOSIS — M546 Pain in thoracic spine: Secondary | ICD-10-CM

## 2019-08-19 ENCOUNTER — Other Ambulatory Visit: Payer: Self-pay

## 2019-08-19 ENCOUNTER — Ambulatory Visit
Admission: RE | Admit: 2019-08-19 | Discharge: 2019-08-19 | Disposition: A | Discharge status: Home / Self Care | Payer: Medicare Other | Source: Ambulatory Visit | Attending: Orthopedic Surgery | Admitting: Orthopedic Surgery

## 2019-08-19 DIAGNOSIS — M546 Pain in thoracic spine: Secondary | ICD-10-CM

## 2019-08-27 ENCOUNTER — Telehealth: Payer: Self-pay

## 2019-08-27 NOTE — Telephone Encounter (Addendum)
Returned call to pt. PPM transmission shows persistent AF since 06/07/19. V rates overall controlled. Pt reports that she was seen on 7/31 at [facility] for nasal/sinus congestion--treated for sinus infection. Symptoms ongoing x2 weeks at that time. Went back 8/13 due to no improvement in symptoms, swabbed for COVID, tested positive. Pt's husband also positive and has double PNA. Pt has not had a CXR.   Current symptoms are sinus congestion, sore throat, ear ache, fatigue and SOB with exertion. Hard to sleep lying down due to congestion. Denies SOB at rest. Compliant with cardiac medications. Ordered a pulse ox, but it hasn't arrived yet. Pt requesting prescription for "something to help." Encouraged pt to contact PCP for recommendations about any OTC medications she can try. Reviewed ED precautions for new or worsening symptoms. Pt verbalizes understanding.  Routed to Dr. Royann Shivers as [place] Childes. Next OV currently scheduled for 09/28/19.

## 2019-08-27 NOTE — Telephone Encounter (Signed)
The pt called to let us know she having some symptoms and tested positive for Covid. She sent a transmission in for the nurse to review. She feeling fatigue, and SOB with exertion. I told her the nurse will review the transmission and give her a call back.

## 2019-09-11 ENCOUNTER — Other Ambulatory Visit: Payer: Medicare Other

## 2019-09-11 ENCOUNTER — Other Ambulatory Visit: Payer: Self-pay

## 2019-09-11 DIAGNOSIS — Z20822 Contact with and (suspected) exposure to covid-19: Secondary | ICD-10-CM

## 2019-09-12 LAB — NOVEL CORONAVIRUS, NAA: SARS-CoV-2, NAA: NOT DETECTED

## 2019-09-24 ENCOUNTER — Ambulatory Visit (INDEPENDENT_AMBULATORY_CARE_PROVIDER_SITE_OTHER): Payer: Medicare Other | Admitting: *Deleted

## 2019-09-24 DIAGNOSIS — I495 Sick sinus syndrome: Secondary | ICD-10-CM

## 2019-09-27 LAB — CUP PACEART REMOTE DEVICE CHECK
Battery Remaining Longevity: 29 mo
Battery Voltage: 2.95 V
Brady Statistic RA Percent Paced: 0.03 %
Brady Statistic RV Percent Paced: 14.18 %
Date Time Interrogation Session: 20210917132909
Implantable Lead Implant Date: 20141219
Implantable Lead Implant Date: 20141219
Implantable Lead Location: 753859
Implantable Lead Location: 753860
Implantable Lead Model: 5076
Implantable Lead Model: 5076
Implantable Pulse Generator Implant Date: 20141219
Lead Channel Impedance Value: 380 Ohm
Lead Channel Impedance Value: 380 Ohm
Lead Channel Impedance Value: 437 Ohm
Lead Channel Impedance Value: 456 Ohm
Lead Channel Pacing Threshold Amplitude: 0.625 V
Lead Channel Pacing Threshold Amplitude: 0.75 V
Lead Channel Pacing Threshold Pulse Width: 0.4 ms
Lead Channel Pacing Threshold Pulse Width: 0.4 ms
Lead Channel Sensing Intrinsic Amplitude: 1.375 mV
Lead Channel Sensing Intrinsic Amplitude: 1.375 mV
Lead Channel Sensing Intrinsic Amplitude: 6.625 mV
Lead Channel Sensing Intrinsic Amplitude: 6.625 mV
Lead Channel Setting Pacing Amplitude: 2 V
Lead Channel Setting Pacing Amplitude: 2.5 V
Lead Channel Setting Pacing Pulse Width: 0.4 ms
Lead Channel Setting Sensing Sensitivity: 0.9 mV

## 2019-09-28 ENCOUNTER — Ambulatory Visit (INDEPENDENT_AMBULATORY_CARE_PROVIDER_SITE_OTHER): Payer: Medicare Other | Admitting: Cardiovascular Disease

## 2019-09-28 ENCOUNTER — Other Ambulatory Visit: Payer: Self-pay

## 2019-09-28 ENCOUNTER — Encounter: Payer: Self-pay | Admitting: Cardiovascular Disease

## 2019-09-28 VITALS — BP 118/82 | HR 85 | Ht 62.0 in | Wt 142.6 lb

## 2019-09-28 DIAGNOSIS — E7800 Pure hypercholesterolemia, unspecified: Secondary | ICD-10-CM

## 2019-09-28 DIAGNOSIS — Z7901 Long term (current) use of anticoagulants: Secondary | ICD-10-CM

## 2019-09-28 DIAGNOSIS — Z950 Presence of cardiac pacemaker: Secondary | ICD-10-CM

## 2019-09-28 DIAGNOSIS — I480 Paroxysmal atrial fibrillation: Secondary | ICD-10-CM

## 2019-09-28 DIAGNOSIS — Z5181 Encounter for therapeutic drug level monitoring: Secondary | ICD-10-CM

## 2019-09-28 DIAGNOSIS — I5033 Acute on chronic diastolic (congestive) heart failure: Secondary | ICD-10-CM | POA: Diagnosis not present

## 2019-09-28 DIAGNOSIS — I952 Hypotension due to drugs: Secondary | ICD-10-CM

## 2019-09-28 DIAGNOSIS — I495 Sick sinus syndrome: Secondary | ICD-10-CM

## 2019-09-28 DIAGNOSIS — Z01818 Encounter for other preprocedural examination: Secondary | ICD-10-CM

## 2019-09-28 DIAGNOSIS — Z79899 Other long term (current) drug therapy: Secondary | ICD-10-CM

## 2019-09-28 MED ORDER — AMIODARONE HCL 200 MG PO TABS: 200.0000 mg | ORAL_TABLET | Freq: Two times a day (BID) | ORAL | 1 refills | Status: DC

## 2019-09-28 NOTE — Patient Instructions (Signed)
Medication Instructions:  STOP the Sotalol START Amiodarone 200 mg twice daily  *If you need a refill on your cardiac medications before your next appointment, please call your pharmacy*  Follow-Up: At [facility], you and your health needs are our priority.  As part of our continuing mission to provide you with exceptional heart care, we have created designated Provider Care Teams.  These Care Teams include your primary Cardiologist (physician) and Advanced Practice Providers (APPs -  Physician Assistants and Nurse Practitioners) who all work together to provide you with the care you need, when you need it.  We recommend signing up for the patient portal called "MyChart".  Sign up information is provided on this After Visit Summary.  MyChart is used to connect with patients for Virtual Visits (Telemedicine).  Patients are able to view lab/test results, encounter notes, upcoming appointments, etc.  Non-urgent messages can be sent to your provider as well.   To learn more about what you can do with MyChart, go to [email or web address].    Your next appointment:   Follow up in 2 weeks with the Atrial fibrillation clinic Follow up in 3 months with Dr. Royann Shivers  Other Instructions  You are scheduled for a  Cardioversion on 10/23/19 with Dr. Royann Shivers.  Please arrive at the [facility] (Main Entrance A) at [facility]: [address] [place], [place] [address] at 7:30 am. (1 hour prior to procedured)  DIET: Nothing to eat or drink after midnight except a sip of water with medications (see medication instructions below)  Medication Instructions: Hold: Nothing to hold  Continue your anticoagulant: Savaysa You will need to continue your anticoagulant after yourprocedure until you are told by your provider that it is safe to stop   Labs:  Your provider would like for you to return on 10/20/19 to have the following labs drawn: CMET, CBC and TSH. You do not need an appointment for the  lab. Once in our office lobby there is a podium where you can sign in and ring the doorbell to alert [place] that you are here. The lab is open from 8:00 am to 4:30 pm; closed for lunch from 12:45pm-1:45pm.  You will need to have the coronavirus test completed prior to your procedure. An appointment has been made at 2:50 pm on 10/20/19. This is a Drive Up Visit at [address], [place], [place] [address]. Please tell them that you are there for procedure testing. Stay in your car and someone will be with you shortly. Please make sure to have all other labs completed before this test because you will need to stay quarantined until your procedure.   You must have a responsible person to drive you home and stay in the waiting area during your procedure. Failure to do so could result in cancellation.  Bring your insurance cards.  *Special Note: Every effort is made to have your procedure done on time. Occasionally there are emergencies that occur at the hospital that may cause delays. Please be patient if a delay does occur.

## 2019-09-28 NOTE — Progress Notes (Signed)
Cardiology office note    Date:  09/29/2019   ID:  Monica Harvey, DOB Sep 11, 1940, MRN [id number]  PCP:  Monica Panda, NP  Cardiologist:  Thurmon Fair, MD  Electrophysiologist:  None   Evaluation Performed:  Follow-Up Visit  Chief Complaint:  Dyspnea  History of Present Illness:    Monica Harvey is a 79 y.o. female with longstanding tachycardia-bradycardia syndrome, dual-chamber permanent pacemaker (Medtronic), recurrent episodes of persistent atrial fibrillation on sotalol and direct oral anticoagulant, with previous history of heart failure decompensation due to tachycardia related cardiomyopathy, improved following rhythm correction.  She last underwent cardioversion in June 2020 when she had symptoms of heart failure following an episode of persistent atrial fibrillation.  Since then she has had several episodes of recurrent persistent atrial fibrillation, some of them lengthy but resolving after at most 2 weeks.  She has now been in atrial fibrillation for over a month.  She initially seemed to tolerate it well, but now has developed NYHA functional class III exertional dyspnea and fatigue, despite excellent ventricular rate control.  She is chronically anticoagulated with Savaysa and has not had any interruption in anticoagulation or any bleeding problems.  She denies exertional angina, orthopnea, PND, syncope.  She has palpitations off and on.  She has mild ankle swelling, but has not tolerated taking the furosemide daily because it causes dizziness.  On the average she takes it 2 or 3 days a week.  She has not had any neurological events.  Pacemaker function is otherwise normal.  She has a Medtronic advisa dual-chamber device implanted in 2014 that has roughly 2 years of estimated generator longevity.  There is notable reduction in activity over the last month during atrial fibrillation.  When not in atrial fibrillation she has virtually 100% atrial pacing and 0  ventricular pacing.  Now that she is in atrial fibrillation there is roughly 21% ventricular pacing.  Her most recent echocardiogram from October 2020 shows near normalization of left ventricular systolic function with an EF that is now 50-55%.  Subtle reduction in EF is due to LBBB related dyssynchrony.  EF has improved compared to July 2019 (EF 45-50%).  Diastolic function parameters suggest normal left ventricular filling pressures.   Past Medical History:  Diagnosis Date  . Anginal pain (HCC)   . Chronic anticoagulation, CHAD2Vasc score 4 02/09/2014  . Chronic diastolic CHF (congestive heart failure) (HCC)    a. 01/2014 Echo: EF 50-55%, Gr1 DD, mild AI/MR.  . First degree atrioventricular block   . GERD (gastroesophageal reflux disease)   . Heart murmur   . HTN (hypertension)   . Hyperlipidemia   . LBBB (left bundle branch block)   . PAF (paroxysmal atrial fibrillation) (HCC)    a. Previously on flecainide;  b. 01/2014 sotalol loaded and dccv performed;  c. Chronic Savaysa Rx.  . Presence of permanent cardiac pacemaker    a. 12/26/12 s/p MDT DC PPM.  . SSS (sick sinus syndrome) (HCC)    a. s/p PPM-Medtronic placed 12/26/12   . Symptomatic sinus bradycardia    Past Surgical History:  Procedure Laterality Date  . APPENDECTOMY  1971  . BREAST EXCISIONAL BIOPSY Right   . BREAST LUMPECTOMY Right 1980's?   "benign"  . CARDIOVERSION N/A 01/22/2014   Procedure: CARDIOVERSION;  Surgeon: Chrystie Nose, MD;  [facility];  Service: Cardiovascular;  Laterality: N/A;  . CARDIOVERSION N/A 04/10/2017   Procedure: CARDIOVERSION;  Surgeon: Jake Bathe, MD;  Location: [facility] ENDOSCOPY;  Service: Cardiovascular;  Laterality: N/A;  . INSERT / REPLACE / REMOVE PACEMAKER  12/26/2012   Medtronic, Dr. Tamiah Dysart  . NM MYOCAR PERF WALL MOTION  08/05/2009   small to mod. reversible anteroseptal,septal,& apical perfusion defect. EF 59%.  . PERMANENT PACEMAKER INSERTION N/A 12/26/2012   Procedure:  PERMANENT PACEMAKER INSERTION;  Surgeon: Khamarion Bjelland, MD;  Location: [facility] CATH LAB;  Service: Cardiovascular;  Laterality: N/A;  . TONSILLECTOMY AND ADENOIDECTOMY  1960's  . TUBAL LIGATION  1971  . VAGINAL HYSTERECTOMY  1980's     Current Meds  Medication Sig  . acetaminophen (TYLENOL) 500 MG tablet Take 500 mg by mouth daily as needed for moderate pain or headache.  . albuterol (VENTOLIN HFA) 108 (90 Base) MCG/ACT inhaler Inhale 2 puffs into the lungs every 6 (six) hours as needed for wheezing or shortness of breath.  . diltiazem (CARDIZEM CD) 120 MG 24 hr capsule TAKE 1 CAPSULE DAILY  . fluticasone (FLONASE) 50 MCG/ACT nasal spray Place 1 spray into both nostrils daily as needed for allergies or rhinitis.  . irbesartan (AVAPRO) 150 MG tablet TAKE 1 TABLET DAILY  . omeprazole (PRILOSEC) 20 MG capsule Take 20 mg by mouth daily as needed (acid reflux).  . ondansetron (ZOFRAN ODT) 4 MG disintegrating tablet Take 1 tablet (4 mg total) by mouth every 8 (eight) hours as needed for nausea or vomiting.  . Pitavastatin Calcium (LIVALO) 1 MG TABS Take 1 tablet (1 mg total) by mouth daily.  . potassium chloride (KLOR-CON) 10 MEQ tablet Take 1 tablet (10 mEq total) by mouth daily.  . SAVAYSA 30 MG TABS tablet TAKE 1 TABLET DAILY  . [DISCONTINUED] sotalol (BETAPACE) 120 MG tablet Take 1 tablet (120 mg total) by mouth every 12 (twelve) hours.     Allergies:   Codeine   Social History   Tobacco Use  . Smoking status: Never Smoker  . Smokeless tobacco: Never Used  Vaping Use  . Vaping Use: Never used  Substance Use Topics  . Alcohol use: No  . Drug use: No     Family Hx: The patient's family history includes Heart attack in her mother; Stroke in her sister.  ROS:   Please see the history of present illness.    All other systems are reviewed and are negative.  Prior CV studies:   The following studies were reviewed today:  Comprehensive pacemaker check today.    Echocardiogram November 06, 2018  1. Left ventricular ejection fraction, by visual estimation, is 50 to 55%. The left ventricle has normal function. There is mildly increased left ventricular hypertrophy.  2. Left ventricular diastolic parameters are consistent with Grade I diastolic dysfunction (impaired relaxation).  3. Global right ventricle has normal systolic function.The right ventricular size is normal. No increase in right ventricular wall thickness.  4. Left atrial size was normal.  5. Right atrial size was normal.  6. The mitral valve is normal in structure. Trace mitral valve regurgitation.  7. The tricuspid valve is grossly normal. Tricuspid valve regurgitation is mild.  8. Aortic valve regurgitation is mild to moderate.  9. The aortic valve is normal in structure. Aortic valve regurgitation is mild to moderate. No evidence of aortic valve sclerosis or stenosis. 10. The pulmonic valve was grossly normal. Pulmonic valve regurgitation is trivial. 11. Aortic dilatation noted. 12. There is mild to moderate dilatation of the ascending aorta measuring 42 mm. 13. Normal pulmonary artery systolic pressure. 14. A pacer wire is visualized. 15. The atrial   septum is grossly normal.  Labs/Other Tests and Data Reviewed:    EKG:  ECG performed today shows atrial fibrillation, left bundle branch block with left axis deviation, QTC 516 ms.  Recent Labs: 12/01/2018: Hemoglobin 13.6; Platelets 314 06/22/2019: ALT 22; BUN 18; Creatinine, Ser 1.23; Potassium 4.9; Sodium 140   Recent Lipid Panel Lab Results  Component Value Date/Time   CHOL 152 06/22/2019 09:01 AM   TRIG 88 06/22/2019 09:01 AM   HDL 56 06/22/2019 09:01 AM   CHOLHDL 2.7 06/22/2019 09:01 AM   CHOLHDL 2.5 09/01/2015 09:54 AM   LDLCALC 79 06/22/2019 09:01 AM    Wt Readings from Last 3 Encounters:  09/28/19 142 lb 9.6 oz (64.7 kg)  06/22/19 152 lb (68.9 kg)  11/21/18 152 lb (68.9 kg)     Objective:    Vital Signs:  BP 118/82   Pulse 85   Ht  5\' 2"  (1.575 m)   Wt 142 lb 9.6 oz (64.7 kg)   SpO2 99%   BMI 26.08 kg/m     General: Alert, oriented x3, no distress, mildly overweight.  Healthy left subclavian pacemaker site. Head: no evidence of trauma, PERRL, EOMI, no exophtalmos or lid lag, no myxedema, no xanthelasma; normal ears, nose and oropharynx Neck: normal jugular venous pulsations and no hepatojugular reflux; brisk carotid pulses without delay and no carotid bruits Chest: clear to auscultation, no signs of consolidation by percussion or palpation, normal fremitus, symmetrical and full respiratory excursions Cardiovascular: normal position and quality of the apical impulse, irregular rhythm, normal first and paradoxically split second heart sounds, no murmurs, rubs or gallops Abdomen: no tenderness or distention, no masses by palpation, no abnormal pulsatility or arterial bruits, normal bowel sounds, no hepatosplenomegaly Extremities: no clubbing, cyanosis or edema; 2+ radial, ulnar and brachial pulses bilaterally; 2+ right femoral, posterior tibial and dorsalis pedis pulses; 2+ left femoral, posterior tibial and dorsalis pedis pulses; no subclavian or femoral bruits Neurological: grossly nonfocal Psych: Normal mood and affect   ASSESSMENT & PLAN:    1. Acute on chronic diastolic heart failure (HCC)   2. PAF (paroxysmal atrial fibrillation) (HCC)   3. Pre-op testing   4. Long term current use of anticoagulant   5. Pacemaker- MDT implanted 12/26/12   6. SSS (sick sinus syndrome) (HCC)   7. Drug-induced hypotension   8. Encounter for monitoring amiodarone therapy   9. Pure hypercholesterolemia      1. CHF:  after a month of persistent atrial fibrillation she has developed worsening symptoms of heart failure.  She does not appear to be clinically hypervolemic and her weight is actually about 10 pounds less than it was 3 months ago.  She is symptomatic despite good ventricular rate control.  She has virtually normal left  ventricular systolic function (mild LBBB related dyssynchrony).  She is receiving a "beta-blocker" (sotalol) and angiotensin receptor blocker.  She is taking a tiny dose of loop diuretic. 2. AFib: It initially appeared that she would tolerate the atrial fibrillation better, but her functional status has deteriorated after a month in atrial fibrillatio.12/28/12  CHADSVasc 4-5 (age, gender, HTN, CHF, possible CAD). She has been compliant with the anticoagulation.  It does not appear that she will tolerate atrial fibrillation in the long run and she has failed sotalol in a relatively high dose.  We will stop the sotalol and start amiodarone with a plan for cardioversion in 3-4 weeks, to allow an amiodarone "load".  This procedure has been fully reviewed with the  patient and written informed consent has been obtained. Consider referring to electrophysiology for ablation.  According to the last echocardiogram both the right and left atrium were normal in size. 3. Anticoagulation: Compliant with the Savaysa, has not had bleeding problems. 4. PM: Normal device function.  Some increase in ventricular pacing due to the irregularity of the rhythm.  Continue remote downloads every 3 months. 5. SSS: Prior to the development of persistent atrial fibrillation she was "atrially pacemaker dependent". 6. Hypotension: Does not tolerate higher diuretic doses. 7. Amiodarone: We will check baseline liver and thyroid function test before her cardioversion.  Discussed the potential for numerous side effects including thyroid disorder, hepatotoxicity, lung fibrosis, skin photosensitivity, ocular complications, multiple drug interactions, etc.  Hence, it is probably worth referring for EP evaluation and ablation even though she is in her 42s. 8. HLP:  She has not tolerated potent statins or higher doses of pitavastatin.  Since she does not have significant CAD or PAD, and LDL cholesterol less than 119 is considered satisfactory.      COVID-19 Education: The signs and symptoms of COVID-19 were discussed with the patient and how to seek care for testing (follow up with PCP or arrange E-visit).  The importance of social distancing was discussed today.  Time:   Today, I have spent 22 minutes with the patient with telehealth technology discussing the above problems.     Medication Adjustments/Labs and Tests Ordered: Current medicines are reviewed at length with the patient today.  Concerns regarding medicines are outlined above.   Tests Ordered: Orders Placed This Encounter  Procedures  . Comprehensive metabolic panel  . TSH  . CBC  . EKG 12-Lead    Medication Changes: Meds ordered this encounter  Medications  . amiodarone (PACERONE) 200 MG tablet    Sig: Take 1 tablet (200 mg total) by mouth 2 (two) times daily.    Dispense:  60 tablet    Refill:  1   Patient Instructions  Medication Instructions:  STOP the Sotalol START Amiodarone 200 mg twice daily  *If you need a refill on your cardiac medications before your next appointment, please call your pharmacy*  Follow-Up: At [facility], you and your health needs are our priority.  As part of our continuing mission to provide you with exceptional heart care, we have created designated Provider Care Teams.  These Care Teams include your primary Cardiologist (physician) and Advanced Practice Providers (APPs -  Physician Assistants and Nurse Practitioners) who all work together to provide you with the care you need, when you need it.  We recommend signing up for the patient portal called "MyChart".  Sign up information is provided on this After Visit Summary.  MyChart is used to connect with patients for Virtual Visits (Telemedicine).  Patients are able to view lab/test results, encounter notes, upcoming appointments, etc.  Non-urgent messages can be sent to your provider as well.   To learn more about what you can do with MyChart, go to  [email or web address].    Your next appointment:   Follow up in 2 weeks with the Atrial fibrillation clinic Follow up in 3 months with Dr. Royann Shivers  Other Instructions  You are scheduled for a  Cardioversion on 10/23/19 with Dr. Royann Shivers.  Please arrive at the [facility] (Main Entrance A) at [facility]: [address] [place], [place] [address] at 7:30 am. (1 hour prior to procedured)  DIET: Nothing to eat or drink after midnight except a sip of  water with medications (see medication instructions below)  Medication Instructions: Hold: Nothing to hold  Continue your anticoagulant: Savaysa You will need to continue your anticoagulant after yourprocedure until you are told by your provider that it is safe to stop   Labs:  Your provider would like for you to return on 10/20/19 to have the following labs drawn: CMET, CBC and TSH. You do not need an appointment for the lab. Once in our office lobby there is a podium where you can sign in and ring the doorbell to alert [place] that you are here. The lab is open from 8:00 am to 4:30 pm; closed for lunch from 12:45pm-1:45pm.  You will need to have the coronavirus test completed prior to your procedure. An appointment has been made at 2:50 pm on 10/20/19. This is a Drive Up Visit at [address] [place], [place], [place] [address]. Please tell them that you are there for procedure testing. Stay in your car and someone will be with you shortly. Please make sure to have all other labs completed before this test because you will need to stay quarantined until your procedure.   You must have a responsible person to drive you home and stay in the waiting area during your procedure. Failure to do so could result in cancellation.  Bring your insurance cards.  *Special Note: Every effort is made to have your procedure done on time. Occasionally there are emergencies that occur at the hospital that may cause delays. Please be patient if a delay does  occur.       Disposition:  Follow up 3 months  Signed, Thurmon Fair, MD  09/29/2019 10:39 AM    [facility]

## 2019-09-28 NOTE — H&P (View-Only) (Signed)
Cardiology office note    Date:  09/29/2019   ID:  Monica Harvey, DOB Sep 11, 1940, MRN [id number]  PCP:  Marva Panda, NP  Cardiologist:  Thurmon Fair, MD  Electrophysiologist:  None   Evaluation Performed:  Follow-Up Visit  Chief Complaint:  Dyspnea  History of Present Illness:    Monica Harvey is a 79 y.o. female with longstanding tachycardia-bradycardia syndrome, dual-chamber permanent pacemaker (Medtronic), recurrent episodes of persistent atrial fibrillation on sotalol and direct oral anticoagulant, with previous history of heart failure decompensation due to tachycardia related cardiomyopathy, improved following rhythm correction.  She last underwent cardioversion in June 2020 when she had symptoms of heart failure following an episode of persistent atrial fibrillation.  Since then she has had several episodes of recurrent persistent atrial fibrillation, some of them lengthy but resolving after at most 2 weeks.  She has now been in atrial fibrillation for over a month.  She initially seemed to tolerate it well, but now has developed NYHA functional class III exertional dyspnea and fatigue, despite excellent ventricular rate control.  She is chronically anticoagulated with Savaysa and has not had any interruption in anticoagulation or any bleeding problems.  She denies exertional angina, orthopnea, PND, syncope.  She has palpitations off and on.  She has mild ankle swelling, but has not tolerated taking the furosemide daily because it causes dizziness.  On the average she takes it 2 or 3 days a week.  She has not had any neurological events.  Pacemaker function is otherwise normal.  She has a Medtronic advisa dual-chamber device implanted in 2014 that has roughly 2 years of estimated generator longevity.  There is notable reduction in activity over the last month during atrial fibrillation.  When not in atrial fibrillation she has virtually 100% atrial pacing and 0  ventricular pacing.  Now that she is in atrial fibrillation there is roughly 21% ventricular pacing.  Her most recent echocardiogram from October 2020 shows near normalization of left ventricular systolic function with an EF that is now 50-55%.  Subtle reduction in EF is due to LBBB related dyssynchrony.  EF has improved compared to July 2019 (EF 45-50%).  Diastolic function parameters suggest normal left ventricular filling pressures.   Past Medical History:  Diagnosis Date  . Anginal pain (HCC)   . Chronic anticoagulation, CHAD2Vasc score 4 02/09/2014  . Chronic diastolic CHF (congestive heart failure) (HCC)    a. 01/2014 Echo: EF 50-55%, Gr1 DD, mild AI/MR.  . First degree atrioventricular block   . GERD (gastroesophageal reflux disease)   . Heart murmur   . HTN (hypertension)   . Hyperlipidemia   . LBBB (left bundle branch block)   . PAF (paroxysmal atrial fibrillation) (HCC)    a. Previously on flecainide;  b. 01/2014 sotalol loaded and dccv performed;  c. Chronic Savaysa Rx.  . Presence of permanent cardiac pacemaker    a. 12/26/12 s/p MDT DC PPM.  . SSS (sick sinus syndrome) (HCC)    a. s/p PPM-Medtronic placed 12/26/12   . Symptomatic sinus bradycardia    Past Surgical History:  Procedure Laterality Date  . APPENDECTOMY  1971  . BREAST EXCISIONAL BIOPSY Right   . BREAST LUMPECTOMY Right 1980's?   "benign"  . CARDIOVERSION N/A 01/22/2014   Procedure: CARDIOVERSION;  Surgeon: Chrystie Nose, MD;  [facility];  Service: Cardiovascular;  Laterality: N/A;  . CARDIOVERSION N/A 04/10/2017   Procedure: CARDIOVERSION;  Surgeon: Jake Bathe, MD;  Location: [facility] ENDOSCOPY;  Service: Cardiovascular;  Laterality: N/A;  . INSERT / REPLACE / REMOVE PACEMAKER  12/26/2012   Medtronic, Dr. Royann Shivers  . NM MYOCAR PERF WALL MOTION  08/05/2009   small to mod. reversible anteroseptal,septal,& apical perfusion defect. EF 59%.  Marland Kitchen PERMANENT PACEMAKER INSERTION N/A 12/26/2012   Procedure:  PERMANENT PACEMAKER INSERTION;  Surgeon: Thurmon Fair, MD;  Location: [facility] CATH LAB;  Service: Cardiovascular;  Laterality: N/A;  . TONSILLECTOMY AND ADENOIDECTOMY  1960's  . TUBAL LIGATION  1971  . VAGINAL HYSTERECTOMY  1980's     Current Meds  Medication Sig  . acetaminophen (TYLENOL) 500 MG tablet Take 500 mg by mouth daily as needed for moderate pain or headache.  . albuterol (VENTOLIN HFA) 108 (90 Base) MCG/ACT inhaler Inhale 2 puffs into the lungs every 6 (six) hours as needed for wheezing or shortness of breath.  . diltiazem (CARDIZEM CD) 120 MG 24 hr capsule TAKE 1 CAPSULE DAILY  . fluticasone (FLONASE) 50 MCG/ACT nasal spray Place 1 spray into both nostrils daily as needed for allergies or rhinitis.  Marland Kitchen irbesartan (AVAPRO) 150 MG tablet TAKE 1 TABLET DAILY  . omeprazole (PRILOSEC) 20 MG capsule Take 20 mg by mouth daily as needed (acid reflux).  . ondansetron (ZOFRAN ODT) 4 MG disintegrating tablet Take 1 tablet (4 mg total) by mouth every 8 (eight) hours as needed for nausea or vomiting.  . Pitavastatin Calcium (LIVALO) 1 MG TABS Take 1 tablet (1 mg total) by mouth daily.  . potassium chloride (KLOR-CON) 10 MEQ tablet Take 1 tablet (10 mEq total) by mouth daily.  Marland Kitchen SAVAYSA 30 MG TABS tablet TAKE 1 TABLET DAILY  . [DISCONTINUED] sotalol (BETAPACE) 120 MG tablet Take 1 tablet (120 mg total) by mouth every 12 (twelve) hours.     Allergies:   Codeine   Social History   Tobacco Use  . Smoking status: Never Smoker  . Smokeless tobacco: Never Used  Vaping Use  . Vaping Use: Never used  Substance Use Topics  . Alcohol use: No  . Drug use: No     Family Hx: The patient's family history includes Heart attack in her mother; Stroke in her sister.  ROS:   Please see the history of present illness.    All other systems are reviewed and are negative.  Prior CV studies:   The following studies were reviewed today:  Comprehensive pacemaker check today.    Echocardiogram November 06, 2018  1. Left ventricular ejection fraction, by visual estimation, is 50 to 55%. The left ventricle has normal function. There is mildly increased left ventricular hypertrophy.  2. Left ventricular diastolic parameters are consistent with Grade I diastolic dysfunction (impaired relaxation).  3. Global right ventricle has normal systolic function.The right ventricular size is normal. No increase in right ventricular wall thickness.  4. Left atrial size was normal.  5. Right atrial size was normal.  6. The mitral valve is normal in structure. Trace mitral valve regurgitation.  7. The tricuspid valve is grossly normal. Tricuspid valve regurgitation is mild.  8. Aortic valve regurgitation is mild to moderate.  9. The aortic valve is normal in structure. Aortic valve regurgitation is mild to moderate. No evidence of aortic valve sclerosis or stenosis. 10. The pulmonic valve was grossly normal. Pulmonic valve regurgitation is trivial. 11. Aortic dilatation noted. 12. There is mild to moderate dilatation of the ascending aorta measuring 42 mm. 13. Normal pulmonary artery systolic pressure. 14. A pacer wire is visualized. 15. The atrial  septum is grossly normal.  Labs/Other Tests and Data Reviewed:    EKG:  ECG performed today shows atrial fibrillation, left bundle branch block with left axis deviation, QTC 516 ms.  Recent Labs: 12/01/2018: Hemoglobin 13.6; Platelets 314 06/22/2019: ALT 22; BUN 18; Creatinine, Ser 1.23; Potassium 4.9; Sodium 140   Recent Lipid Panel Lab Results  Component Value Date/Time   CHOL 152 06/22/2019 09:01 AM   TRIG 88 06/22/2019 09:01 AM   HDL 56 06/22/2019 09:01 AM   CHOLHDL 2.7 06/22/2019 09:01 AM   CHOLHDL 2.5 09/01/2015 09:54 AM   LDLCALC 79 06/22/2019 09:01 AM    Wt Readings from Last 3 Encounters:  09/28/19 142 lb 9.6 oz (64.7 kg)  06/22/19 152 lb (68.9 kg)  11/21/18 152 lb (68.9 kg)     Objective:    Vital Signs:  BP 118/82   Pulse 85   Ht  5\' 2"  (1.575 m)   Wt 142 lb 9.6 oz (64.7 kg)   SpO2 99%   BMI 26.08 kg/m     General: Alert, oriented x3, no distress, mildly overweight.  Healthy left subclavian pacemaker site. Head: no evidence of trauma, PERRL, EOMI, no exophtalmos or lid lag, no myxedema, no xanthelasma; normal ears, nose and oropharynx Neck: normal jugular venous pulsations and no hepatojugular reflux; brisk carotid pulses without delay and no carotid bruits Chest: clear to auscultation, no signs of consolidation by percussion or palpation, normal fremitus, symmetrical and full respiratory excursions Cardiovascular: normal position and quality of the apical impulse, irregular rhythm, normal first and paradoxically split second heart sounds, no murmurs, rubs or gallops Abdomen: no tenderness or distention, no masses by palpation, no abnormal pulsatility or arterial bruits, normal bowel sounds, no hepatosplenomegaly Extremities: no clubbing, cyanosis or edema; 2+ radial, ulnar and brachial pulses bilaterally; 2+ right femoral, posterior tibial and dorsalis pedis pulses; 2+ left femoral, posterior tibial and dorsalis pedis pulses; no subclavian or femoral bruits Neurological: grossly nonfocal Psych: Normal mood and affect   ASSESSMENT & PLAN:    1. Acute on chronic diastolic heart failure (HCC)   2. PAF (paroxysmal atrial fibrillation) (HCC)   3. Pre-op testing   4. Long term current use of anticoagulant   5. Pacemaker- MDT implanted 12/26/12   6. SSS (sick sinus syndrome) (HCC)   7. Drug-induced hypotension   8. Encounter for monitoring amiodarone therapy   9. Pure hypercholesterolemia      1. CHF:  after a month of persistent atrial fibrillation she has developed worsening symptoms of heart failure.  She does not appear to be clinically hypervolemic and her weight is actually about 10 pounds less than it was 3 months ago.  She is symptomatic despite good ventricular rate control.  She has virtually normal left  ventricular systolic function (mild LBBB related dyssynchrony).  She is receiving a "beta-blocker" (sotalol) and angiotensin receptor blocker.  She is taking a tiny dose of loop diuretic. 2. AFib: It initially appeared that she would tolerate the atrial fibrillation better, but her functional status has deteriorated after a month in atrial fibrillatio.12/28/12  CHADSVasc 4-5 (age, gender, HTN, CHF, possible CAD). She has been compliant with the anticoagulation.  It does not appear that she will tolerate atrial fibrillation in the long run and she has failed sotalol in a relatively high dose.  We will stop the sotalol and start amiodarone with a plan for cardioversion in 3-4 weeks, to allow an amiodarone "load".  This procedure has been fully reviewed with the  patient and written informed consent has been obtained. Consider referring to electrophysiology for ablation.  According to the last echocardiogram both the right and left atrium were normal in size. 3. Anticoagulation: Compliant with the Savaysa, has not had bleeding problems. 4. PM: Normal device function.  Some increase in ventricular pacing due to the irregularity of the rhythm.  Continue remote downloads every 3 months. 5. SSS: Prior to the development of persistent atrial fibrillation she was "atrially pacemaker dependent". 6. Hypotension: Does not tolerate higher diuretic doses. 7. Amiodarone: We will check baseline liver and thyroid function test before her cardioversion.  Discussed the potential for numerous side effects including thyroid disorder, hepatotoxicity, lung fibrosis, skin photosensitivity, ocular complications, multiple drug interactions, etc.  Hence, it is probably worth referring for EP evaluation and ablation even though she is in her 42s. 8. HLP:  She has not tolerated potent statins or higher doses of pitavastatin.  Since she does not have significant CAD or PAD, and LDL cholesterol less than 119 is considered satisfactory.      COVID-19 Education: The signs and symptoms of COVID-19 were discussed with the patient and how to seek care for testing (follow up with PCP or arrange E-visit).  The importance of social distancing was discussed today.  Time:   Today, I have spent 22 minutes with the patient with telehealth technology discussing the above problems.     Medication Adjustments/Labs and Tests Ordered: Current medicines are reviewed at length with the patient today.  Concerns regarding medicines are outlined above.   Tests Ordered: Orders Placed This Encounter  Procedures  . Comprehensive metabolic panel  . TSH  . CBC  . EKG 12-Lead    Medication Changes: Meds ordered this encounter  Medications  . amiodarone (PACERONE) 200 MG tablet    Sig: Take 1 tablet (200 mg total) by mouth 2 (two) times daily.    Dispense:  60 tablet    Refill:  1   Patient Instructions  Medication Instructions:  STOP the Sotalol START Amiodarone 200 mg twice daily  *If you need a refill on your cardiac medications before your next appointment, please call your pharmacy*  Follow-Up: At [facility], you and your health needs are our priority.  As part of our continuing mission to provide you with exceptional heart care, we have created designated Provider Care Teams.  These Care Teams include your primary Cardiologist (physician) and Advanced Practice Providers (APPs -  Physician Assistants and Nurse Practitioners) who all work together to provide you with the care you need, when you need it.  We recommend signing up for the patient portal called "MyChart".  Sign up information is provided on this After Visit Summary.  MyChart is used to connect with patients for Virtual Visits (Telemedicine).  Patients are able to view lab/test results, encounter notes, upcoming appointments, etc.  Non-urgent messages can be sent to your provider as well.   To learn more about what you can do with MyChart, go to  [email or web address].    Your next appointment:   Follow up in 2 weeks with the Atrial fibrillation clinic Follow up in 3 months with Dr. Royann Shivers  Other Instructions  You are scheduled for a  Cardioversion on 10/23/19 with Dr. Royann Shivers.  Please arrive at the [facility] (Main Entrance A) at [facility]: [address] [place], [place] [address] at 7:30 am. (1 hour prior to procedured)  DIET: Nothing to eat or drink after midnight except a sip of  water with medications (see medication instructions below)  Medication Instructions: Hold: Nothing to hold  Continue your anticoagulant: Savaysa You will need to continue your anticoagulant after yourprocedure until you are told by your provider that it is safe to stop   Labs:  Your provider would like for you to return on 10/20/19 to have the following labs drawn: CMET, CBC and TSH. You do not need an appointment for the lab. Once in our office lobby there is a podium where you can sign in and ring the doorbell to alert [place] that you are here. The lab is open from 8:00 am to 4:30 pm; closed for lunch from 12:45pm-1:45pm.  You will need to have the coronavirus test completed prior to your procedure. An appointment has been made at 2:50 pm on 10/20/19. This is a Drive Up Visit at [address] [place], [place], [place] [address]. Please tell them that you are there for procedure testing. Stay in your car and someone will be with you shortly. Please make sure to have all other labs completed before this test because you will need to stay quarantined until your procedure.   You must have a responsible person to drive you home and stay in the waiting area during your procedure. Failure to do so could result in cancellation.  Bring your insurance cards.  *Special Note: Every effort is made to have your procedure done on time. Occasionally there are emergencies that occur at the hospital that may cause delays. Please be patient if a delay does  occur.       Disposition:  Follow up 3 months  Signed, Thurmon Fair, MD  09/29/2019 10:39 AM    [facility]

## 2019-09-28 NOTE — Progress Notes (Signed)
Remote pacemaker transmission.   

## 2019-09-29 ENCOUNTER — Other Ambulatory Visit: Payer: Self-pay | Admitting: *Deleted

## 2019-09-29 ENCOUNTER — Encounter: Payer: Self-pay | Admitting: Cardiovascular Disease

## 2019-10-20 ENCOUNTER — Telehealth: Payer: Self-pay | Admitting: Cardiovascular Disease

## 2019-10-20 ENCOUNTER — Other Ambulatory Visit (HOSPITAL_COMMUNITY)
Admission: RE | Admit: 2019-10-20 | Discharge: 2019-10-20 | Disposition: A | Discharge status: Home / Self Care | Payer: Medicare Other | Source: Ambulatory Visit | Attending: Cardiovascular Disease | Admitting: Cardiovascular Disease

## 2019-10-20 DIAGNOSIS — Z20822 Contact with and (suspected) exposure to covid-19: Secondary | ICD-10-CM | POA: Insufficient documentation

## 2019-10-20 DIAGNOSIS — Z01812 Encounter for preprocedural laboratory examination: Secondary | ICD-10-CM | POA: Diagnosis present

## 2019-10-20 LAB — COMPREHENSIVE METABOLIC PANEL
ALT: 19 IU/L (ref 0–32)
AST: 19 IU/L (ref 0–40)
Albumin/Globulin Ratio: 1.7 (ref 1.2–2.2)
Albumin: 4.4 g/dL (ref 3.7–4.7)
Alkaline Phosphatase: 130 IU/L — ABNORMAL HIGH (ref 44–121)
BUN/Creatinine Ratio: 12 (ref 12–28)
BUN: 18 mg/dL (ref 8–27)
Bilirubin Total: 0.5 mg/dL (ref 0.0–1.2)
CO2: 22 mmol/L (ref 20–29)
Calcium: 10.5 mg/dL — ABNORMAL HIGH (ref 8.7–10.3)
Chloride: 105 mmol/L (ref 96–106)
Creatinine, Ser: 1.45 mg/dL — ABNORMAL HIGH (ref 0.57–1.00)
GFR calc Af Amer: 40 mL/min/{1.73_m2} — ABNORMAL LOW (ref >59)
GFR calc non Af Amer: 35 mL/min/{1.73_m2} — ABNORMAL LOW (ref >59)
Globulin, Total: 2.6 g/dL (ref 1.5–4.5)
Glucose: 95 mg/dL (ref 65–99)
Potassium: 4.2 mmol/L (ref 3.5–5.2)
Sodium: 142 mmol/L (ref 134–144)
Total Protein: 7 g/dL (ref 6.0–8.5)

## 2019-10-20 LAB — CBC
Hematocrit: 40.8 % (ref 34.0–46.6)
Hemoglobin: 14.2 g/dL (ref 11.1–15.9)
MCH: 31.4 pg (ref 26.6–33.0)
MCHC: 34.8 g/dL (ref 31.5–35.7)
MCV: 90 fL (ref 79–97)
Platelets: 289 10*3/uL (ref 150–450)
RBC: 4.52 x10E6/uL (ref 3.77–5.28)
RDW: 13.8 % (ref 11.7–15.4)
WBC: 7.1 10*3/uL (ref 3.4–10.8)

## 2019-10-20 LAB — SARS CORONAVIRUS 2 (TAT 6-24 HRS): SARS Coronavirus 2: NEGATIVE

## 2019-10-20 LAB — TSH: TSH: 3.46 u[IU]/mL (ref 0.450–4.500)

## 2019-10-20 MED ORDER — AMIODARONE HCL 200 MG PO TABS: 200.0000 mg | ORAL_TABLET | Freq: Two times a day (BID) | ORAL | 2 refills | Status: DC

## 2019-10-20 NOTE — Telephone Encounter (Signed)
*  STAT* If patient is at the pharmacy, call can be transferred to refill team.   1. Which medications need to be refilled? (please list name of each medication and dose if known)   amiodarone (PACERONE) 200 MG tablet     2. Which pharmacy/location (including street and city if local pharmacy) is medication to be sent to? [facility] [place], [place] - [address] AT Portal to Registered [employer] Sites  3. Do they need a 30 day or 90 day supply? 90 days with refills  Patient said it is cheaper to get the medication for 90 days from the mail order service than getting it OTC. She would like her rx updated so she can save money.

## 2019-10-23 ENCOUNTER — Ambulatory Visit (HOSPITAL_COMMUNITY)
Admission: RE | Admit: 2019-10-23 | Discharge: 2019-10-23 | Disposition: A | Discharge status: Home / Self Care | Payer: Medicare Other | Attending: Cardiovascular Disease | Admitting: Cardiovascular Disease

## 2019-10-23 ENCOUNTER — Encounter (HOSPITAL_COMMUNITY)
Admission: RE | Disposition: A | Discharge status: Home / Self Care | Payer: Self-pay | Source: Home / Self Care | Attending: Cardiovascular Disease

## 2019-10-23 ENCOUNTER — Ambulatory Visit (HOSPITAL_COMMUNITY): Payer: Medicare Other | Admitting: Certified Registered Nurse Anesthetist

## 2019-10-23 ENCOUNTER — Other Ambulatory Visit: Payer: Self-pay

## 2019-10-23 ENCOUNTER — Encounter (HOSPITAL_COMMUNITY): Payer: Self-pay | Admitting: Cardiovascular Disease

## 2019-10-23 DIAGNOSIS — I4819 Other persistent atrial fibrillation: Secondary | ICD-10-CM | POA: Insufficient documentation

## 2019-10-23 DIAGNOSIS — I480 Paroxysmal atrial fibrillation: Secondary | ICD-10-CM | POA: Diagnosis present

## 2019-10-23 DIAGNOSIS — I5032 Chronic diastolic (congestive) heart failure: Secondary | ICD-10-CM | POA: Diagnosis not present

## 2019-10-23 DIAGNOSIS — I440 Atrioventricular block, first degree: Secondary | ICD-10-CM | POA: Insufficient documentation

## 2019-10-23 DIAGNOSIS — I447 Left bundle-branch block, unspecified: Secondary | ICD-10-CM | POA: Diagnosis not present

## 2019-10-23 DIAGNOSIS — Z7901 Long term (current) use of anticoagulants: Secondary | ICD-10-CM | POA: Diagnosis not present

## 2019-10-23 DIAGNOSIS — I110 Hypertensive heart disease with heart failure: Secondary | ICD-10-CM | POA: Diagnosis not present

## 2019-10-23 DIAGNOSIS — Z950 Presence of cardiac pacemaker: Secondary | ICD-10-CM | POA: Diagnosis not present

## 2019-10-23 DIAGNOSIS — E785 Hyperlipidemia, unspecified: Secondary | ICD-10-CM | POA: Diagnosis not present

## 2019-10-23 DIAGNOSIS — E7800 Pure hypercholesterolemia, unspecified: Secondary | ICD-10-CM | POA: Diagnosis not present

## 2019-10-23 DIAGNOSIS — Z79899 Other long term (current) drug therapy: Secondary | ICD-10-CM | POA: Diagnosis not present

## 2019-10-23 DIAGNOSIS — K219 Gastro-esophageal reflux disease without esophagitis: Secondary | ICD-10-CM | POA: Diagnosis not present

## 2019-10-23 DIAGNOSIS — I495 Sick sinus syndrome: Secondary | ICD-10-CM | POA: Diagnosis not present

## 2019-10-23 HISTORY — PX: CARDIOVERSION: SHX1299

## 2019-10-23 SURGERY — CARDIOVERSION
Anesthesia: General

## 2019-10-23 MED ORDER — PROPOFOL 10 MG/ML IV BOLUS
INTRAVENOUS | Status: DC | PRN
  Administered 2019-10-23: 60 mg via INTRAVENOUS

## 2019-10-23 MED ORDER — AMIODARONE HCL 200 MG PO TABS: 200.0000 mg | ORAL_TABLET | Freq: Once | ORAL | 2 refills | Status: DC

## 2019-10-23 MED ORDER — SODIUM CHLORIDE 0.9 % IV SOLN: INTRAVENOUS | Status: DC

## 2019-10-23 MED ORDER — LIDOCAINE 2% (20 MG/ML) 5 ML SYRINGE
INTRAMUSCULAR | Status: DC | PRN
  Administered 2019-10-23: 40 mg via INTRAVENOUS

## 2019-10-23 NOTE — Interval H&P Note (Signed)
History and Physical Interval Note:  10/23/2019 8:28 AM  Monica Harvey  has presented today for surgery, with the diagnosis of A-FIB.  The various methods of treatment have been discussed with the patient and family. After consideration of risks, benefits and other options for treatment, the patient has consented to  Procedure(s): CARDIOVERSION (N/A) as a surgical intervention.  The patient's history has been reviewed, patient examined, no change in status, stable for surgery.  I have reviewed the patient's chart and labs.  Questions were answered to the patient's satisfaction.     Jakiah Bienaime

## 2019-10-23 NOTE — Anesthesia Procedure Notes (Signed)
Procedure Name: General with mask airway Date/Time: 10/23/2019 8:43 AM Performed by: Jodell Cipro, CRNA Pre-anesthesia Checklist: Patient identified, Emergency Drugs available, Suction available, Patient being monitored and Timeout performed Patient Re-evaluated:Patient Re-evaluated prior to induction Oxygen Delivery Method: Ambu bag Preoxygenation: Pre-oxygenation with 100% oxygen Dental Injury: Teeth and Oropharynx as per pre-operative assessment

## 2019-10-23 NOTE — Transfer of Care (Signed)
Immediate Anesthesia Transfer of Care Note  Patient: Monica Harvey  Procedure(s) Performed: CARDIOVERSION (N/A )  Patient Location: Endoscopy Unit  Anesthesia Type:General  Level of Consciousness: awake, alert  and oriented  Airway & Oxygen Therapy: Patient Spontanous Breathing  Post-op Assessment: Report given to RN, Post -op Vital signs reviewed and stable and Patient moving all extremities  Post vital signs: Reviewed and stable  Last Vitals:  Vitals Value Taken Time  BP    Temp    Pulse    Resp    SpO2      Last Pain:  Vitals:   10/23/19 0731  TempSrc: Oral  PainSc: 0-No pain         Complications: No complications documented.

## 2019-10-23 NOTE — Discharge Instructions (Signed)
Electrical Cardioversion   What can I expect after the procedure?  Your blood pressure, heart rate, breathing rate, and blood oxygen level will be monitored until you leave the hospital or clinic.  Your heart rhythm will be watched to make sure it does not change.  You may have some redness on the skin where the shocks were given. Follow these instructions at home:  Do not drive for 24 hours if you were given a sedative during your procedure.  Take over-the-counter and prescription medicines only as told by your health care provider.  Ask your health care provider how to check your pulse. Check it often.  Rest for 48 hours after the procedure or as told by your health care provider.  Avoid or limit your caffeine use as told by your health care provider.  Keep all follow-up visits as told by your health care provider. This is important. Contact a health care provider if:  You feel like your heart is beating too quickly or your pulse is not regular.  You have a serious muscle cramp that does not go away. Get help right away if:  You have discomfort in your chest.  You are dizzy or you feel faint.  You have trouble breathing or you are short of breath.  Your speech is slurred.  You have trouble moving an arm or leg on one side of your body.  Your fingers or toes turn cold or blue. Summary  Electrical cardioversion is the delivery of a jolt of electricity to restore a normal rhythm to the heart.  This procedure may be done right away in an emergency or may be a scheduled procedure if the condition is not an emergency.  Generally, this is a safe procedure.  After the procedure, check your pulse often as told by your health care provider. This information is not intended to replace advice given to you by your health care provider. Make sure you discuss any questions you have with your health care provider. Document Revised: 07/28/2018 Document Reviewed:  07/28/2018 Elsevier Patient Education  2020 Elsevier Inc.  

## 2019-10-23 NOTE — Anesthesia Postprocedure Evaluation (Signed)
Anesthesia Post Note  Patient: Monica Harvey  Procedure(s) Performed: CARDIOVERSION (N/A )     Patient location during evaluation: PACU Anesthesia Type: General Level of consciousness: awake and alert Pain management: pain level controlled Vital Signs Assessment: post-procedure vital signs reviewed and stable Respiratory status: spontaneous breathing, nonlabored ventilation, respiratory function stable and patient connected to nasal cannula oxygen Cardiovascular status: blood pressure returned to baseline and stable Postop Assessment: no apparent nausea or vomiting Anesthetic complications: no   No complications documented.  Last Vitals:  Vitals:   10/23/19 0900 10/23/19 0910  BP: (!) 147/74 134/63  Pulse: 64 68  Resp: 19 17  Temp:    SpO2: 99% 98%    Last Pain:  Vitals:   10/23/19 0910  TempSrc:   PainSc: 0-No pain                 Shelton Silvas

## 2019-10-23 NOTE — Op Note (Signed)
Procedure: Electrical Cardioversion Indications:  Atrial Fibrillation  Procedure Details:  Consent: Risks of procedure as well as the alternatives and risks of each were explained to the (patient/caregiver).  Consent for procedure obtained.  Time Out: Verified patient identification, verified procedure, site/side was marked, verified correct patient position, special equipment/implants available, medications/allergies/relevent history reviewed, required imaging and test results available.  Performed  Patient placed on cardiac monitor, pulse oximetry, supplemental oxygen as necessary.  Sedation given: IV propofol, Dr. Hart [place] Pacer pads placed anterior and posterior chest.  Cardioverted 3 time(s).  Cardioversion with synchronized biphasic 200J shock. (3rd shock with extra pressure applied to anterior pad)  Evaluation: Findings: Post procedure EKG shows: A paced V paced rhythm Complications: None Patient did tolerate procedure well.  Device interrogated after DCCV, normal function. AV delay extended to sensed 220 ms/paced 240 ms to allow native AV conduction.  Time Spent Directly with the Patient:  30 minutes   Monica Harvey 10/23/2019, 8:53 AM

## 2019-10-23 NOTE — Anesthesia Preprocedure Evaluation (Addendum)
Anesthesia Evaluation  Patient identified by MRN, date of birth, ID band Patient awake    Reviewed: Allergy & Precautions, NPO status , Patient's Chart, lab work & pertinent test results  Airway Mallampati: I  TM Distance: >3 FB Neck ROM: Full    Dental  (+) Teeth Intact, Dental Advisory Given   Pulmonary neg pulmonary ROS,    breath sounds clear to auscultation       Cardiovascular hypertension, Pt. on medications +CHF  + dysrhythmias Atrial Fibrillation + pacemaker  Rhythm:Irregular Rate:Normal     Neuro/Psych negative neurological ROS  negative psych ROS   GI/Hepatic Neg liver ROS, GERD  Medicated,  Endo/Other  negative endocrine ROS  Renal/GU Renal InsufficiencyRenal disease     Musculoskeletal negative musculoskeletal ROS (+)   Abdominal Normal abdominal exam  (+)   Peds  Hematology negative hematology ROS (+)   Anesthesia Other Findings   Reproductive/Obstetrics                            Anesthesia Physical Anesthesia Plan  ASA: III  Anesthesia Plan: General   Post-op Pain Management:    Induction: Intravenous  PONV Risk Score and Plan: 0 and Propofol infusion  Airway Management Planned: Natural Airway and Simple Face Mask  Additional Equipment: None  Intra-op Plan:   Post-operative Plan:   Informed Consent: I have reviewed the patients History and Physical, chart, labs and discussed the procedure including the risks, benefits and alternatives for the proposed anesthesia with the patient or authorized representative who has indicated his/her understanding and acceptance.       Plan Discussed with: CRNA  Anesthesia Plan Comments: (Echo: 1. Left ventricular ejection fraction, by visual estimation, is 50 to  55%. The left ventricle has normal function. There is mildly increased  left ventricular hypertrophy.  2. Left ventricular diastolic parameters are  consistent with Grade I  diastolic dysfunction (impaired relaxation).  3. Global right ventricle has normal systolic function.The right  ventricular size is normal. No increase in right ventricular wall  thickness.  4. Left atrial size was normal.  5. Right atrial size was normal.  6. The mitral valve is normal in structure. Trace mitral valve  regurgitation.  7. The tricuspid valve is grossly normal. Tricuspid valve regurgitation  is mild.  8. Aortic valve regurgitation is mild to moderate.  9. The aortic valve is normal in structure. Aortic valve regurgitation is  mild to moderate. No evidence of aortic valve sclerosis or stenosis.  10. The pulmonic valve was grossly normal. Pulmonic valve regurgitation is  trivial.  11. Aortic dilatation noted.  12. There is mild to moderate dilatation of the ascending aorta measuring  42 mm.  13. Normal pulmonary artery systolic pressure.  14. A pacer wire is visualized.  15. The atrial septum is grossly normal. )       Anesthesia Quick Evaluation

## 2019-10-26 ENCOUNTER — Other Ambulatory Visit: Payer: Self-pay | Admitting: Physician Assistant

## 2019-10-26 ENCOUNTER — Encounter (HOSPITAL_COMMUNITY): Payer: Self-pay | Admitting: Cardiovascular Disease

## 2019-10-26 DIAGNOSIS — Z1231 Encounter for screening mammogram for malignant neoplasm of breast: Secondary | ICD-10-CM

## 2019-11-03 ENCOUNTER — Ambulatory Visit (HOSPITAL_COMMUNITY)
Admission: RE | Admit: 2019-11-03 | Discharge: 2019-11-03 | Disposition: A | Discharge status: Home / Self Care | Payer: Medicare Other | Source: Ambulatory Visit | Attending: Nurse Practitioner | Admitting: Nurse Practitioner

## 2019-11-03 ENCOUNTER — Other Ambulatory Visit: Payer: Self-pay

## 2019-11-03 ENCOUNTER — Encounter (HOSPITAL_COMMUNITY): Payer: Self-pay | Admitting: Nurse Practitioner

## 2019-11-03 VITALS — BP 126/80 | HR 68 | Ht 62.0 in | Wt 142.0 lb

## 2019-11-03 DIAGNOSIS — I480 Paroxysmal atrial fibrillation: Secondary | ICD-10-CM | POA: Diagnosis not present

## 2019-11-03 DIAGNOSIS — I4891 Unspecified atrial fibrillation: Secondary | ICD-10-CM | POA: Diagnosis not present

## 2019-11-03 DIAGNOSIS — Z79899 Other long term (current) drug therapy: Secondary | ICD-10-CM | POA: Diagnosis not present

## 2019-11-03 DIAGNOSIS — Z950 Presence of cardiac pacemaker: Secondary | ICD-10-CM | POA: Insufficient documentation

## 2019-11-03 DIAGNOSIS — D6869 Other thrombophilia: Secondary | ICD-10-CM

## 2019-11-03 DIAGNOSIS — I4819 Other persistent atrial fibrillation: Secondary | ICD-10-CM | POA: Insufficient documentation

## 2019-11-03 NOTE — Progress Notes (Signed)
Excellent.  Thank you 

## 2019-11-03 NOTE — Progress Notes (Signed)
Primary Care Physician: Marva Panda, NP Referring Physician: Dr.Croituro   Monica Harvey is a 79 y.o. female with a h/o PPM,  afib , previously on sotalol. On last visit with Dr. Salena Saner in September, on interrogation of her device, she was found to have been in afib for several months.  He stopped sotalol, started her on amiodarone for several weeks and ultimately had a successful cardioversion. She remains in rhythm today with ekg showing a sensed v paced. She feels improved with more energy.  Today, she denies symptoms of palpitations, chest pain, shortness of breath, orthopnea, PND, lower extremity edema, dizziness, presyncope, syncope, or neurologic sequela. The patient is tolerating medications without difficulties and is otherwise without complaint today.   Past Medical History:  Diagnosis Date  . Anginal pain (HCC)   . Chronic anticoagulation, CHAD2Vasc score 4 02/09/2014  . Chronic diastolic CHF (congestive heart failure) (HCC)    a. 01/2014 Echo: EF 50-55%, Gr1 DD, mild AI/MR.  . First degree atrioventricular block   . GERD (gastroesophageal reflux disease)   . Heart murmur   . HTN (hypertension)   . Hyperlipidemia   . LBBB (left bundle branch block)   . PAF (paroxysmal atrial fibrillation) (HCC)    a. Previously on flecainide;  b. 01/2014 sotalol loaded and dccv performed;  c. Chronic Savaysa Rx.  . Presence of permanent cardiac pacemaker    a. 12/26/12 s/p MDT DC PPM.  . SSS (sick sinus syndrome) (HCC)    a. s/p PPM-Medtronic placed 12/26/12   . Symptomatic sinus bradycardia    Past Surgical History:  Procedure Laterality Date  . APPENDECTOMY  1971  . BREAST EXCISIONAL BIOPSY Right   . BREAST LUMPECTOMY Right 1980's?   "benign"  . CARDIOVERSION N/A 01/22/2014   Procedure: CARDIOVERSION;  Surgeon: Chrystie Nose, MD;  Location: [facility] ENDOSCOPY;  Service: Cardiovascular;  Laterality: N/A;  . CARDIOVERSION N/A 04/10/2017   Procedure: CARDIOVERSION;  Surgeon: Jake Bathe, MD;  Location: [facility] ENDOSCOPY;  Service: Cardiovascular;  Laterality: N/A;  . CARDIOVERSION N/A 10/23/2019   Procedure: CARDIOVERSION;  Surgeon: Thurmon Fair, MD;  Location: [facility] ENDOSCOPY;  Service: Cardiovascular;  Laterality: N/A;  . INSERT / REPLACE / REMOVE PACEMAKER  12/26/2012   Medtronic, Dr. Royann Shivers  . NM MYOCAR PERF WALL MOTION  08/05/2009   small to mod. reversible anteroseptal,septal,& apical perfusion defect. EF 59%.  Marland Kitchen PERMANENT PACEMAKER INSERTION N/A 12/26/2012   Procedure: PERMANENT PACEMAKER INSERTION;  Surgeon: Thurmon Fair, MD;  Location: [facility] CATH LAB;  Service: Cardiovascular;  Laterality: N/A;  . TONSILLECTOMY AND ADENOIDECTOMY  1960's  . TUBAL LIGATION  1971  . VAGINAL HYSTERECTOMY  1980's    Current Outpatient Medications  Medication Sig Dispense Refill  . acetaminophen (TYLENOL) 500 MG tablet Take 500 mg by mouth daily as needed for moderate pain or headache.    Marland Kitchen amiodarone (PACERONE) 200 MG tablet Take 1 tablet (200 mg total) by mouth once for 1 dose. 180 tablet 2  . diltiazem (CARDIZEM CD) 120 MG 24 hr capsule TAKE 1 CAPSULE DAILY (Patient taking differently: Take 120 mg by mouth daily. ) 90 capsule 3  . fluticasone (FLONASE) 50 MCG/ACT nasal spray Place 1 spray into both nostrils daily as needed for allergies or rhinitis.    . furosemide (LASIX) 20 MG tablet Take 1 tablet (20 mg total) by mouth daily. 30 tablet 5  . irbesartan (AVAPRO) 150 MG tablet TAKE 1 TABLET DAILY (Patient taking differently: Take 150 mg by  mouth daily. ) 90 tablet 3  . omeprazole (PRILOSEC) 20 MG capsule Take 20 mg by mouth daily as needed (acid reflux).    . Pitavastatin Calcium (LIVALO) 1 MG TABS Take 1 tablet (1 mg total) by mouth daily. (Patient taking differently: Take 1 mg by mouth daily. ) 90 tablet 1  . potassium chloride (KLOR-CON) 10 MEQ tablet Take 1 tablet (10 mEq total) by mouth daily. 30 tablet 5  . SAVAYSA 30 MG TABS tablet TAKE 1 TABLET DAILY (Patient taking differently:  Take 30 mg by mouth daily. ) 90 tablet 3  . albuterol (VENTOLIN HFA) 108 (90 Base) MCG/ACT inhaler Inhale 2 puffs into the lungs every 6 (six) hours as needed for wheezing or shortness of breath. (Patient not taking: Reported on 11/03/2019)     No current facility-administered medications for this encounter.    Allergies  Allergen Reactions  . Azithromycin Palpitations    Double vision  . Codeine Nausea And Vomiting    Social History   Socioeconomic History  . Marital status: Married    Spouse name: Not on file  . Number of children: Not on file  . Years of education: Not on file  . Highest education level: Not on file  Occupational History  . Not on file  Tobacco Use  . Smoking status: Never Smoker  . Smokeless tobacco: Never Used  Vaping Use  . Vaping Use: Never used  Substance and Sexual Activity  . Alcohol use: No  . Drug use: No  . Sexual activity: Not Currently  Other Topics Concern  . Not on file  Social History Narrative  . Not on file   Social Determinants of Health   Financial Resource Strain:   . Difficulty of Paying Living Expenses: Not on file  Food Insecurity:   . Worried About Programme researcher, broadcasting/film/video in the Last Year: Not on file  . Ran Out of Food in the Last Year: Not on file  Transportation Needs:   . Lack of Transportation (Medical): Not on file  . Lack of Transportation (Non-Medical): Not on file  Physical Activity:   . Days of Exercise per Week: Not on file  . Minutes of Exercise per Session: Not on file  Stress:   . Feeling of Stress : Not on file  Social Connections:   . Frequency of Communication with Friends and Family: Not on file  . Frequency of Social Gatherings with Friends and Family: Not on file  . Attends Religious Services: Not on file  . Active Member of Clubs or Organizations: Not on file  . Attends Banker Meetings: Not on file  . Marital Status: Not on file  Intimate Partner Violence:   . Fear of Current or  Ex-Partner: Not on file  . Emotionally Abused: Not on file  . Physically Abused: Not on file  . Sexually Abused: Not on file    Family History  Problem Relation Age of Onset  . Heart attack Mother   . Stroke Sister     ROS- All systems are reviewed and negative except as per the HPI above  Physical Exam: Vitals:   11/03/19 1007  BP: 126/80  Pulse: 68  Weight: 64.4 kg  Height: 5\' 2"  (1.575 m)   Wt Readings from Last 3 Encounters:  11/03/19 64.4 kg  10/23/19 63.5 kg  09/28/19 64.7 kg    Labs: Lab Results  Component Value Date   NA 142 10/20/2019   K 4.2 10/20/2019  CL 105 10/20/2019   CO2 22 10/20/2019   GLUCOSE 95 10/20/2019   BUN 18 10/20/2019   CREATININE 1.45 (H) 10/20/2019   CALCIUM 10.5 (H) 10/20/2019   MG 2.1 01/21/2014   Lab Results  Component Value Date   INR 1.33 01/21/2014   Lab Results  Component Value Date   CHOL 152 06/22/2019   HDL 56 06/22/2019   LDLCALC 79 06/22/2019   TRIG 88 06/22/2019     GEN- The patient is well appearing, alert and oriented x 3 today.   Head- normocephalic, atraumatic Eyes-  Sclera clear, conjunctiva pink Ears- hearing intact Oropharynx- clear Neck- supple, no JVP Lymph- no cervical lymphadenopathy Lungs- Clear to ausculation bilaterally, normal work of breathing Heart- Regular rate and rhythm, no murmurs, rubs or gallops, PMI not laterally displaced GI- soft, NT, ND, + BS Extremities- no clubbing, cyanosis, or edema MS- no significant deformity or atrophy Skin- no rash or lesion Psych- euthymic mood, full affect Neuro- strength and sensation are intact  EKG-a paced rhythm with prolonged AV conduction LBBB, LAD, v rate 68 bpm, pr int 216 ms, qtrs int 142 ms, qtc 493 ms      Assessment and Plan: 1. Persistent  afib Successful cardioversion 10/23/19 Sotalol was stopped  prior to cardioversion as it failed to keep pt in SR  and was loaded on amiodarone  Maintaining SR  Continue amiodarone 200 mg daily     2. CHA2DS2VASc score of at least 6 Continue with savaysa 30 mg daily   F/u with Dr. Jamey Ripa scheduled 01/2020  [facility]. [facility] [address] [place], [place] [address] [phone]

## 2019-11-07 ENCOUNTER — Ambulatory Visit: Payer: Medicare Other | Attending: Internal Medicine

## 2019-11-07 DIAGNOSIS — Z23 Encounter for immunization: Secondary | ICD-10-CM

## 2019-11-07 NOTE — Progress Notes (Signed)
   Covid-19 Vaccination Clinic  Name:  Monica Harvey    MRN: [id number] DOB: 18-Aug-1940  11/07/2019  Monica Harvey was observed post Covid-19 immunization for 30 minutes based on pre-vaccination screening without incident. She was provided with Vaccine Information Sheet and instruction to access the V-Safe system.   Monica Harvey was instructed to call 911 with any severe reactions post vaccine: Marland Kitchen Difficulty breathing  . Swelling of face and throat  . A fast heartbeat  . A bad rash all over body  . Dizziness and weakness

## 2019-11-13 ENCOUNTER — Other Ambulatory Visit: Payer: Self-pay | Admitting: Cardiovascular Disease

## 2019-11-16 ENCOUNTER — Ambulatory Visit: Payer: Medicare Other | Admitting: Physician Assistant

## 2019-11-16 ENCOUNTER — Ambulatory Visit (INDEPENDENT_AMBULATORY_CARE_PROVIDER_SITE_OTHER): Payer: Medicare Other | Admitting: Physician Assistant

## 2019-11-16 ENCOUNTER — Encounter: Payer: Self-pay | Admitting: Physician Assistant

## 2019-11-16 ENCOUNTER — Other Ambulatory Visit: Payer: Self-pay

## 2019-11-16 VITALS — BP 120/60 | HR 68 | Temp 98.3°F | Resp 14 | Ht 62.0 in | Wt 147.6 lb

## 2019-11-16 DIAGNOSIS — Z23 Encounter for immunization: Secondary | ICD-10-CM | POA: Diagnosis not present

## 2019-11-16 DIAGNOSIS — I10 Essential (primary) hypertension: Secondary | ICD-10-CM | POA: Diagnosis not present

## 2019-11-16 DIAGNOSIS — I4819 Other persistent atrial fibrillation: Secondary | ICD-10-CM | POA: Diagnosis not present

## 2019-11-16 DIAGNOSIS — J310 Chronic rhinitis: Secondary | ICD-10-CM

## 2019-11-16 DIAGNOSIS — M8589 Other specified disorders of bone density and structure, multiple sites: Secondary | ICD-10-CM | POA: Diagnosis not present

## 2019-11-16 NOTE — Addendum Note (Signed)
Addended by: Con Memos on: 11/16/2019 02:09 PM   Modules accepted: Orders

## 2019-11-16 NOTE — Progress Notes (Signed)
Patient presents to clinic today to establish care.  Acute Concerns: Endorses fall/winter allergy symptoms -- post-nasal drip, rhinorrhea, occasional cough. Improves on its own in spring/summer. Is not taking anything for symptoms.   Chronic Issues: Hypertension/HLD/Atrial Fibrillation -- Managed by Cardiology. S/p cardioversion earlier this year. Has been doing very well since then. Is currently on a regimen of diltiazem, Amiodarone, Avapro, Lasix, Livalo and Savaysa. Patient denies chest pain, palpitations, lightheadedness, dizziness, vision changes or frequent headaches.    Health Maintenance: Immunizations -- flu up-to-date. Unsure of Prevnar. Has had pneumovax at age 76.Marland Kitchen Bone Density -- Overdue. History of osteopenia. No DEXA in several years per patient..   Past Medical History:  Diagnosis Date  . Allergy   . Anginal pain ([place])   . Chronic anticoagulation, CHAD2Vasc score 4 02/09/2014  . Chronic diastolic CHF (congestive heart failure) ([place])    a. 01/2014 Echo: EF 50-55%, Gr1 DD, mild AI/MR.  Marland Kitchen Chronic kidney disease   . First degree atrioventricular block   . GERD (gastroesophageal reflux disease)   . Heart murmur   . HTN (hypertension)   . Hyperlipidemia   . LBBB (left bundle branch block)   . Osteoporosis    Osteopenia  . PAF (paroxysmal atrial fibrillation) ([place])    a. Previously on flecainide;  b. 01/2014 sotalol loaded and dccv performed;  c. Chronic Savaysa Rx.  . Presence of permanent cardiac pacemaker    a. 12/26/12 s/p MDT DC PPM.  . SSS (sick sinus syndrome) ([place])    a. s/p PPM-Medtronic placed 12/26/12   . Symptomatic sinus bradycardia     Past Surgical History:  Procedure Laterality Date  . APPENDECTOMY  1971  . BREAST EXCISIONAL BIOPSY Right   . BREAST LUMPECTOMY Right 1980's?   "benign"  . CARDIOVERSION N/A 01/22/2014   Procedure: CARDIOVERSION;  Surgeon: Pixie Casino, MD;  Location: [place];  Service: Cardiovascular;  Laterality: N/A;  .  CARDIOVERSION N/A 04/10/2017   Procedure: CARDIOVERSION;  Surgeon: Jerline Pain, MD;  Location: [facility] ENDOSCOPY;  Service: Cardiovascular;  Laterality: N/A;  . CARDIOVERSION N/A 10/23/2019   Procedure: CARDIOVERSION;  Surgeon: Sanda Klein, MD;  Location: [facility] ENDOSCOPY;  Service: Cardiovascular;  Laterality: N/A;  . INSERT / REPLACE / REMOVE PACEMAKER  12/26/2012   Medtronic, Dr. Sallyanne Kuster  . NM MYOCAR PERF WALL MOTION  08/05/2009   small to mod. reversible anteroseptal,septal,& apical perfusion defect. EF 59%.  Marland Kitchen PERMANENT PACEMAKER INSERTION N/A 12/26/2012   Procedure: PERMANENT PACEMAKER INSERTION;  Surgeon: Sanda Klein, MD;  Location: [place] CATH LAB;  Service: Cardiovascular;  Laterality: N/A;  . TONSILLECTOMY AND ADENOIDECTOMY  1960's  . TUBAL LIGATION  1971  . VAGINAL HYSTERECTOMY  1980's    Current Outpatient Medications on File Prior to Visit  Medication Sig Dispense Refill  . acetaminophen (TYLENOL) 500 MG tablet Take 500 mg by mouth daily as needed for moderate pain or headache.    Marland Kitchen amiodarone (PACERONE) 200 MG tablet TAKE 1 TABLET BY MOUTH TWICE A DAY 60 tablet 1  . diltiazem (CARDIZEM CD) 120 MG 24 hr capsule TAKE 1 CAPSULE DAILY (Patient taking differently: Take 120 mg by mouth daily. ) 90 capsule 3  . fluticasone (FLONASE) 50 MCG/ACT nasal spray Place 1 spray into both nostrils daily as needed for allergies or rhinitis.    Marland Kitchen irbesartan (AVAPRO) 150 MG tablet TAKE 1 TABLET DAILY (Patient taking differently: Take 150 mg by mouth daily. ) 90 tablet 3  . omeprazole (PRILOSEC) 20 MG  capsule Take 20 mg by mouth daily as needed (acid reflux).    . Pitavastatin Calcium (LIVALO) 1 MG TABS Take 1 tablet (1 mg total) by mouth daily. (Patient taking differently: Take 1 mg by mouth daily. ) 90 tablet 1  . SAVAYSA 30 MG TABS tablet TAKE 1 TABLET DAILY (Patient taking differently: Take 30 mg by mouth daily. ) 90 tablet 3  . albuterol (VENTOLIN HFA) 108 (90 Base) MCG/ACT inhaler Inhale 2 puffs  into the lungs every 6 (six) hours as needed for wheezing or shortness of breath.  (Patient not taking: Reported on 11/16/2019)    . furosemide (LASIX) 20 MG tablet Take 1 tablet (20 mg total) by mouth daily. 30 tablet 5  . potassium chloride (KLOR-CON) 10 MEQ tablet Take 1 tablet (10 mEq total) by mouth daily. 30 tablet 5   No current facility-administered medications on file prior to visit.    Allergies  Allergen Reactions  . Azithromycin Palpitations    Double vision  . Codeine Nausea And Vomiting    Family History  Problem Relation Age of Onset  . Heart attack Mother   . Stroke Sister     Social History   Socioeconomic History  . Marital status: Married    Spouse name: Clance Boll  . Number of children: 1  . Years of education: Not on file  . Highest education level: Not on file  Occupational History  . Not on file  Tobacco Use  . Smoking status: Never Smoker  . Smokeless tobacco: Never Used  Vaping Use  . Vaping Use: Never used  Substance and Sexual Activity  . Alcohol use: No  . Drug use: No  . Sexual activity: Not Currently  Other Topics Concern  . Not on file  Social History Narrative  . Not on file   Social Determinants of Health   Financial Resource Strain:   . Difficulty of Paying Living Expenses: Not on file  Food Insecurity:   . Worried About Charity fundraiser in the Last Year: Not on file  . Ran Out of Food in the Last Year: Not on file  Transportation Needs:   . Lack of Transportation (Medical): Not on file  . Lack of Transportation (Non-Medical): Not on file  Physical Activity:   . Days of Exercise per Week: Not on file  . Minutes of Exercise per Session: Not on file  Stress:   . Feeling of Stress : Not on file  Social Connections:   . Frequency of Communication with Friends and Family: Not on file  . Frequency of Social Gatherings with Friends and Family: Not on file  . Attends Religious Services: Not on file  . Active Member of Clubs or  Organizations: Not on file  . Attends Archivist Meetings: Not on file  . Marital Status: Not on file  Intimate Partner Violence:   . Fear of Current or Ex-Partner: Not on file  . Emotionally Abused: Not on file  . Physically Abused: Not on file  . Sexually Abused: Not on file   ROS Pertinent ROS are listed in the HPI.  BP 120/60   Pulse 68   Temp 98.3 F (36.8 C) (Temporal)   Resp 14   Ht '5\' 2"'  (1.575 m)   Wt 147 lb 9.6 oz (67 kg)   SpO2 99%   BMI 27.00 kg/m   Physical Exam Vitals reviewed.  Constitutional:      Appearance: Normal appearance.  HENT:  Head: Normocephalic and atraumatic.     Nose: Congestion and rhinorrhea present.  Eyes:     Conjunctiva/sclera: Conjunctivae normal.     Pupils: Pupils are equal, round, and reactive to light.  Cardiovascular:     Rate and Rhythm: Normal rate and regular rhythm.     Pulses: Normal pulses.     Heart sounds: Normal heart sounds.  Pulmonary:     Effort: Pulmonary effort is normal.     Breath sounds: Normal breath sounds.  Musculoskeletal:     Cervical back: Neck supple.  Neurological:     General: No focal deficit present.     Mental Status: She is alert and oriented to person, place, and time.  Psychiatric:        Mood and Affect: Mood normal.      Recent Results (from the past 2160 hour(s))  Novel Coronavirus, NAA ([facility])     Status: None   Collection Time: 09/11/19  9:44 AM   Specimen: Nasopharyngeal(NP) swabs in vial transport medium   Nasopharynge  Screenin  Result Value Ref Range   SARS-CoV-2, NAA Not Detected Not Detected    Comment: This nucleic acid amplification test was developed and its performance characteristics determined by [employer]. Nucleic acid amplification tests include RT-PCR and TMA. This test has not been FDA cleared or approved. This test has been authorized by FDA under an Emergency Use Authorization (EUA). This test is only authorized for the duration of time  the declaration that circumstances exist justifying the authorization of the emergency use of in vitro diagnostic tests for detection of SARS-CoV-2 virus and/or diagnosis of COVID-19 infection under section 564(b)(1) of the Act, 21 U.S.C. 235TDD-2(K) (1), unless the authorization is terminated or revoked sooner. When diagnostic testing is negative, the possibility of a false negative result should be considered in the context of a patient's recent exposures and the presence of clinical signs and symptoms consistent with COVID-19. An individual without symptoms of COVID-19 and who is not shedding SARS-CoV-2 virus wo uld expect to have a negative (not detected) result in this assay.   CUP PACEART REMOTE DEVICE CHECK     Status: None   Collection Time: 09/25/19  1:29 PM  Result Value Ref Range   Date Time Interrogation Session [id number]    Pulse Generator Manufacturer MERM    Pulse Gen Model [id number] Advisa DR MRI    Pulse Gen Serial Number [id number]    Clinic Name [facility] Pulse Generator Type Implantable Pulse Generator    Implantable Pulse Generator Implant Date 73710626    Implantable Lead Manufacturer [facility]    Implantable Lead Model 5076 CapSureFix Novus    Implantable Lead Serial Number [id number]    Implantable Lead Implant Date 94854627    Implantable Lead Location Detail 1 UNKNOWN    Implantable Lead Location [id number]    Implantable Lead Manufacturer MERM    Implantable Lead Model 5076 CapSureFix Novus    Implantable Lead Serial Number [id number]    Implantable Lead Implant Date 03500938    Implantable Lead Location Detail 1 UNKNOWN    Implantable Lead Location [id number]    Lead Channel Setting Sensing Sensitivity 0.9 mV   Lead Channel Setting Pacing Amplitude 2 V   Lead Channel Setting Pacing Pulse Width 0.4 ms   Lead Channel Setting Pacing Amplitude 2.5 V   Lead Channel Impedance Value 437 ohm   Lead Channel Impedance Value 380 ohm   Lead Channel  Sensing Intrinsic  Amplitude 1.375 mV   Lead Channel Sensing Intrinsic Amplitude 1.375 mV   Lead Channel Pacing Threshold Amplitude 0.625 V   Lead Channel Pacing Threshold Pulse Width 0.4 ms   Lead Channel Impedance Value 456 ohm   Lead Channel Impedance Value 380 ohm   Lead Channel Sensing Intrinsic Amplitude 6.625 mV   Lead Channel Sensing Intrinsic Amplitude 6.625 mV   Lead Channel Pacing Threshold Amplitude 0.75 V   Lead Channel Pacing Threshold Pulse Width 0.4 ms   Battery Status OK    Battery Remaining Longevity 29 mo   Battery Voltage 2.95 V   Brady Statistic RA Percent Paced 0.03 %   Brady Statistic RV Percent Paced 14.18 %  Comprehensive metabolic panel     Status: Abnormal   Collection Time: 10/20/19  8:15 AM  Result Value Ref Range   Glucose 95 65 - 99 mg/dL   BUN 18 8 - 27 mg/dL   Creatinine, Ser 1.45 (H) 0.57 - 1.00 mg/dL   GFR calc non Af Amer 35 (L) >59 mL/min/1.73   GFR calc Af Amer 40 (L) >59 mL/min/1.73    Comment: **[facility] currently reports eGFR in compliance with the current**   recommendations of the [employer]. [facility] will   update reporting as new guidelines are published from the NKF-ASN   Task force.    BUN/Creatinine Ratio 12 12 - 28   Sodium 142 134 - 144 mmol/L   Potassium 4.2 3.5 - 5.2 mmol/L   Chloride 105 96 - 106 mmol/L   CO2 22 20 - 29 mmol/L   Calcium 10.5 (H) 8.7 - 10.3 mg/dL   Total Protein 7.0 6.0 - 8.5 g/dL   Albumin 4.4 3.7 - 4.7 g/dL   Globulin, Total 2.6 1.5 - 4.5 g/dL   Albumin/Globulin Ratio 1.7 1.2 - 2.2   Bilirubin Total 0.5 0.0 - 1.2 mg/dL   Alkaline Phosphatase 130 (H) 44 - 121 IU/L    Comment:               **Please note reference interval change**   AST 19 0 - 40 IU/L   ALT 19 0 - 32 IU/L  TSH     Status: None   Collection Time: 10/20/19  8:15 AM  Result Value Ref Range   TSH 3.460 0.450 - 4.500 uIU/mL  CBC     Status: None   Collection Time: 10/20/19  8:15 AM  Result Value Ref Range   WBC 7.1 3.4 -  10.8 x10E3/uL   RBC 4.52 3.77 - 5.28 x10E6/uL   Hemoglobin 14.2 11.1 - 15.9 g/dL   Hematocrit 40.8 34.0 - 46.6 %   MCV 90 79 - 97 fL   MCH 31.4 26.6 - 33.0 pg   MCHC 34.8 31 - 35 g/dL   RDW 13.8 11.7 - 15.4 %   Platelets 289 150 - 450 x10E3/uL  SARS CORONAVIRUS 2 (TAT 6-24 HRS) Nasopharyngeal Nasopharyngeal Swab     Status: None   Collection Time: 10/20/19  2:31 PM   Specimen: Nasopharyngeal Swab  Result Value Ref Range   SARS Coronavirus 2 NEGATIVE NEGATIVE    Comment: (NOTE) SARS-CoV-2 target nucleic acids are NOT DETECTED.  The SARS-CoV-2 RNA is generally detectable in upper and lower respiratory specimens during the acute phase of infection. Negative results do not preclude SARS-CoV-2 infection, do not rule out co-infections with other pathogens, and should not be used as the sole basis for treatment or other patient management decisions. Negative results  must be combined with clinical observations, patient history, and epidemiological information. The expected result is Negative.  Fact Sheet for Patients: SugarRoll.be  Fact Sheet for Healthcare Providers: https://www.woods-mathews.com/  This test is not yet approved or cleared by the [place] FDA and  has been authorized for detection and/or diagnosis of SARS-CoV-2 by FDA under an Emergency Use Authorization (EUA). This EUA will remain  in effect (meaning this test can be used) for the duration of the COVID-19 declaration under Se ction 564(b)(1) of the Act, 21 U.S.C. section 360bbb-3(b)(1), unless the authorization is terminated or revoked sooner.  Performed at [facility], [place] [address] [place], [place] [address]     Assessment/Plan: 1. Primary hypertension 2. Persistent atrial fibrillation (HCC) BP stable. Asymptomatic. Heart RRR s/p cardioversion. Continue management per Cardiology.   3. Osteopenia of multiple sites Overdue for repeat DEXA. Order placed.   - DG Bone Density; Future  4. Chronic rhinitis Supportive measures and OTC medication regimen reviewed. Follow-up if not improving.    This visit occurred during the SARS-CoV-2 public health emergency.  Safety protocols were in place, including screening questions prior to the visit, additional usage of staff PPE, and extensive cleaning of exam room while observing appropriate contact time as indicated for disinfecting solutions.     Leeanne Rio, PA-C

## 2019-11-16 NOTE — Patient Instructions (Signed)
Please start a daily Xyzal (OTC antihistamine) as this is 24-hour lasting and is non-drowsy.   Start a daily saline nasal rinse.  If you do not have history of glaucoma you can also use a daily Nasacort spray to further help. Let me know if symptoms are not improving.   You will be contacted for repeat bone density scan for osteoporosis.  Schedule a physical when due. Follow-up with specialists as scheduled.   It was very nice meeting you today. Welcome to [facility]!

## 2019-12-21 ENCOUNTER — Other Ambulatory Visit: Payer: Self-pay

## 2019-12-21 ENCOUNTER — Ambulatory Visit
Admission: RE | Admit: 2019-12-21 | Discharge: 2019-12-21 | Disposition: A | Discharge status: Home / Self Care | Payer: Medicare Other | Source: Ambulatory Visit | Attending: Physician Assistant | Admitting: Physician Assistant

## 2019-12-21 DIAGNOSIS — Z1231 Encounter for screening mammogram for malignant neoplasm of breast: Secondary | ICD-10-CM

## 2019-12-24 ENCOUNTER — Ambulatory Visit (INDEPENDENT_AMBULATORY_CARE_PROVIDER_SITE_OTHER): Payer: Medicare Other

## 2019-12-24 DIAGNOSIS — I4819 Other persistent atrial fibrillation: Secondary | ICD-10-CM | POA: Diagnosis not present

## 2019-12-25 ENCOUNTER — Other Ambulatory Visit: Payer: Self-pay | Admitting: Physician Assistant

## 2019-12-25 DIAGNOSIS — R928 Other abnormal and inconclusive findings on diagnostic imaging of breast: Secondary | ICD-10-CM

## 2019-12-27 LAB — CUP PACEART REMOTE DEVICE CHECK
Battery Remaining Longevity: 36 mo
Battery Voltage: 2.96 V
Brady Statistic AP VP Percent: 0.05 %
Brady Statistic AP VS Percent: 98.89 %
Brady Statistic AS VP Percent: 0 %
Brady Statistic AS VS Percent: 1.05 %
Brady Statistic RA Percent Paced: 98.94 %
Brady Statistic RV Percent Paced: 0.06 %
Date Time Interrogation Session: 20211217162932
Implantable Lead Implant Date: 20141219
Implantable Lead Implant Date: 20141219
Implantable Lead Location: 753859
Implantable Lead Location: 753860
Implantable Lead Model: 5076
Implantable Lead Model: 5076
Implantable Pulse Generator Implant Date: 20141219
Lead Channel Impedance Value: 361 Ohm
Lead Channel Impedance Value: 380 Ohm
Lead Channel Impedance Value: 418 Ohm
Lead Channel Impedance Value: 437 Ohm
Lead Channel Pacing Threshold Amplitude: 0.5 V
Lead Channel Pacing Threshold Amplitude: 0.875 V
Lead Channel Pacing Threshold Pulse Width: 0.4 ms
Lead Channel Pacing Threshold Pulse Width: 0.4 ms
Lead Channel Sensing Intrinsic Amplitude: 3.375 mV
Lead Channel Sensing Intrinsic Amplitude: 3.375 mV
Lead Channel Sensing Intrinsic Amplitude: 5.875 mV
Lead Channel Sensing Intrinsic Amplitude: 5.875 mV
Lead Channel Setting Pacing Amplitude: 2 V
Lead Channel Setting Pacing Amplitude: 2.5 V
Lead Channel Setting Pacing Pulse Width: 0.4 ms
Lead Channel Setting Sensing Sensitivity: 0.9 mV

## 2019-12-31 ENCOUNTER — Ambulatory Visit
Admission: RE | Admit: 2019-12-31 | Discharge: 2019-12-31 | Disposition: A | Discharge status: Home / Self Care | Payer: Medicare Other | Source: Ambulatory Visit | Attending: Physician Assistant | Admitting: Physician Assistant

## 2019-12-31 ENCOUNTER — Ambulatory Visit: Payer: Medicare Other

## 2019-12-31 ENCOUNTER — Other Ambulatory Visit: Payer: Self-pay

## 2019-12-31 DIAGNOSIS — R928 Other abnormal and inconclusive findings on diagnostic imaging of breast: Secondary | ICD-10-CM

## 2020-01-07 NOTE — Progress Notes (Signed)
Remote pacemaker transmission.   

## 2020-01-11 ENCOUNTER — Ambulatory Visit: Payer: Medicare Other

## 2020-01-17 NOTE — Progress Notes (Signed)
Cardiology office note    Date:  01/18/2020   ID:  Monica Harvey, DOB 04-Feb-1940, MRN [id number]  PCP:  Waldon Merl, PA-C  Cardiologist:  Thurmon Fair, MD  Electrophysiologist:  None   Evaluation Performed:  Follow-Up Visit  Chief Complaint:  Dyspnea  History of Present Illness:    Monica Harvey is a 80 y.o. female with longstanding tachycardia-bradycardia syndrome, dual-chamber permanent pacemaker (Medtronic), recurrent episodes of persistent atrial fibrillation with previous history of heart failure decompensation due to tachycardia related cardiomyopathy, improved following rhythm correction.  After several years of reasonable rhythm control, sotalol repeatedly failed and she transitioned to amiodarone and had cardioversion on 10/23/2019.  She has not had any atrial fibrillation since then.  She has felt occasional palpitations and has had a few weekdays and wondered whether she had atrial fibrillation, but the device shows normal rhythm (atrial paced, ventricular sensed virtually 100% of the time).  She is chronically anticoagulated with Savaysa and has not had any interruption in anticoagulation or any bleeding problems.  The patient specifically denies any chest pain at rest exertion, dyspnea at rest or with exertion, orthopnea, paroxysmal nocturnal dyspnea, syncope, focal neurological deficits, intermittent claudication, lower extremity edema, unexplained weight gain, cough, hemoptysis or wheezing.  She denies falls or bleeding problems.  Pacemaker function is otherwise normal.  She has a Medtronic advisa dual-chamber device implanted in 2014 that has roughly3 years of estimated generator longevity.  There is clear and notable improvement in activity level after correction of atrial fibrillation, now back up to her baseline of 3 hours a day.  She has 99% atrial pacing and no ventricular pacing.  There have been no episodes of high ventricular rate and there has not been  any atrial fibrillation since November 08, 2019.  Her most recent echocardiogram from October 2020 shows near normalization of left ventricular systolic function with an EF that is now 50-55%.  Subtle reduction in EF is due to LBBB related dyssynchrony.  EF has improved compared to July 2019 (EF 45-50%), further improved on echo October 2020 (EF 50-55%).  Diastolic function parameters suggest normal left ventricular filling pressures.    Past Medical History:  Diagnosis Date  . Allergy   . Anginal pain (HCC)   . Chronic anticoagulation, CHAD2Vasc score 4 02/09/2014  . Chronic diastolic CHF (congestive heart failure) (HCC)    a. 01/2014 Echo: EF 50-55%, Gr1 DD, mild AI/MR.  Marland Kitchen Chronic kidney disease   . First degree atrioventricular block   . GERD (gastroesophageal reflux disease)   . Heart murmur   . HTN (hypertension)   . Hyperlipidemia   . LBBB (left bundle branch block)   . Osteoporosis    Osteopenia  . PAF (paroxysmal atrial fibrillation) (HCC)    a. Previously on flecainide;  b. 01/2014 sotalol loaded and dccv performed;  c. Chronic Savaysa Rx.  . Presence of permanent cardiac pacemaker    a. 12/26/12 s/p MDT DC PPM.  . SSS (sick sinus syndrome) (HCC)    a. s/p PPM-Medtronic placed 12/26/12   . Symptomatic sinus bradycardia    Past Surgical History:  Procedure Laterality Date  . APPENDECTOMY  1971  . BREAST EXCISIONAL BIOPSY Right   . BREAST LUMPECTOMY Right 1980's?   "benign"  . CARDIOVERSION N/A 01/22/2014   Procedure: CARDIOVERSION;  Surgeon: Chrystie Nose, MD;  Location: [facility] ENDOSCOPY;  Service: Cardiovascular;  Laterality: N/A;  . CARDIOVERSION N/A 04/10/2017   Procedure: CARDIOVERSION;  Surgeon: [name]  C, MD;  Location: [facility] ENDOSCOPY;  Service: Cardiovascular;  Laterality: N/A;  . CARDIOVERSION N/A 10/23/2019   Procedure: CARDIOVERSION;  Surgeon: Thurmon Fair, MD;  Location: [facility] ENDOSCOPY;  Service: Cardiovascular;  Laterality: N/A;  . INSERT / REPLACE / REMOVE  PACEMAKER  12/26/2012   Medtronic, Dr. Royann Shivers  . NM MYOCAR PERF WALL MOTION  08/05/2009   small to mod. reversible anteroseptal,septal,& apical perfusion defect. EF 59%.  Marland Kitchen PERMANENT PACEMAKER INSERTION N/A 12/26/2012   Procedure: PERMANENT PACEMAKER INSERTION;  Surgeon: Thurmon Fair, MD;  Location: [facility] CATH LAB;  Service: Cardiovascular;  Laterality: N/A;  . TONSILLECTOMY AND ADENOIDECTOMY  1960's  . TUBAL LIGATION  1971  . VAGINAL HYSTERECTOMY  1980's     Current Meds  Medication Sig  . acetaminophen (TYLENOL) 500 MG tablet Take 500 mg by mouth daily as needed for moderate pain or headache.  . albuterol (VENTOLIN HFA) 108 (90 Base) MCG/ACT inhaler Inhale 2 puffs into the lungs every 6 (six) hours as needed for wheezing or shortness of breath.  Marland Kitchen amiodarone (PACERONE) 200 MG tablet Take 200 mg by mouth daily.  Marland Kitchen diltiazem (CARDIZEM CD) 120 MG 24 hr capsule TAKE 1 CAPSULE DAILY (Patient taking differently: Take 120 mg by mouth daily.)  . fluticasone (FLONASE) 50 MCG/ACT nasal spray Place 1 spray into both nostrils daily as needed for allergies or rhinitis.  Marland Kitchen irbesartan (AVAPRO) 150 MG tablet TAKE 1 TABLET DAILY (Patient taking differently: Take 150 mg by mouth daily.)  . omeprazole (PRILOSEC) 20 MG capsule Take 20 mg by mouth daily as needed (acid reflux).  . Pitavastatin Calcium (LIVALO) 1 MG TABS Take 1 tablet (1 mg total) by mouth daily. (Patient taking differently: Take 1 mg by mouth daily.)  . SAVAYSA 30 MG TABS tablet TAKE 1 TABLET DAILY (Patient taking differently: Take 30 mg by mouth daily.)  . [DISCONTINUED] amiodarone (PACERONE) 200 MG tablet TAKE 1 TABLET BY MOUTH TWICE A DAY  . [DISCONTINUED] potassium chloride (KLOR-CON) 10 MEQ tablet Take 1 tablet (10 mEq total) by mouth daily.     Allergies:   Azithromycin and Codeine   Social History   Tobacco Use  . Smoking status: Never Smoker  . Smokeless tobacco: Never Used  Vaping Use  . Vaping Use: Never used  Substance Use  Topics  . Alcohol use: No  . Drug use: No     Family Hx: The patient's family history includes Heart attack in her mother; Stroke in her sister.  ROS:   Please see the history of present illness.    All other systems are reviewed and are negative.  Prior CV studies:   The following studies were reviewed today:  Comprehensive pacemaker check today.    Echocardiogram November 06, 2018  1. Left ventricular ejection fraction, by visual estimation, is 50 to 55%. The left ventricle has normal function. There is mildly increased left ventricular hypertrophy.  2. Left ventricular diastolic parameters are consistent with Grade I diastolic dysfunction (impaired relaxation).  3. Global right ventricle has normal systolic function.The right ventricular size is normal. No increase in right ventricular wall thickness.  4. Left atrial size was normal.  5. Right atrial size was normal.  6. The mitral valve is normal in structure. Trace mitral valve regurgitation.  7. The tricuspid valve is grossly normal. Tricuspid valve regurgitation is mild.  8. Aortic valve regurgitation is mild to moderate.  9. The aortic valve is normal in structure. Aortic valve regurgitation is mild to moderate. No  evidence of aortic valve sclerosis or stenosis. 10. The pulmonic valve was grossly normal. Pulmonic valve regurgitation is trivial. 11. Aortic dilatation noted. 12. There is mild to moderate dilatation of the ascending aorta measuring 42 mm. 13. Normal pulmonary artery systolic pressure. 14. A pacer wire is visualized. 15. The atrial septum is grossly normal.  Labs/Other Tests and Data Reviewed:    EKG:  ECG performed today shows atrial paced, ventricular sensed rhythm, chronic left bundle branch block (QRS 152 ms), QTC 503 ms..  Recent Labs: 10/20/2019: ALT 19; BUN 18; Creatinine, Ser 1.45; Hemoglobin 14.2; Platelets 289; Potassium 4.2; Sodium 142; TSH 3.460   Recent Lipid Panel Lab Results  Component  Value Date/Time   CHOL 152 06/22/2019 09:01 AM   TRIG 88 06/22/2019 09:01 AM   HDL 56 06/22/2019 09:01 AM   CHOLHDL 2.7 06/22/2019 09:01 AM   CHOLHDL 2.5 09/01/2015 09:54 AM   LDLCALC 79 06/22/2019 09:01 AM    Wt Readings from Last 3 Encounters:  01/18/20 150 lb 9.6 oz (68.3 kg)  11/16/19 147 lb 9.6 oz (67 kg)  11/03/19 142 lb (64.4 kg)     Objective:    Vital Signs:  BP 121/69   Pulse 62   Ht 5\' 2"  (1.575 m)   Wt 150 lb 9.6 oz (68.3 kg)   SpO2 98%   BMI 27.55 kg/m     General: Alert, oriented x3, no distress, healthy left subclavian pacemaker site Head: no evidence of trauma, PERRL, EOMI, no exophtalmos or lid lag, no myxedema, no xanthelasma; normal ears, nose and oropharynx Neck: normal jugular venous pulsations and no hepatojugular reflux; brisk carotid pulses without delay and no carotid bruits Chest: clear to auscultation, no signs of consolidation by percussion or palpation, normal fremitus, symmetrical and full respiratory excursions Cardiovascular: normal position and quality of the apical impulse, regular rhythm, normal first and paradoxically split second heart sounds, no murmurs, rubs or gallops Abdomen: no tenderness or distention, no masses by palpation, no abnormal pulsatility or arterial bruits, normal bowel sounds, no hepatosplenomegaly Extremities: no clubbing, cyanosis or edema; 2+ radial, ulnar and brachial pulses bilaterally; 2+ right femoral, posterior tibial and dorsalis pedis pulses; 2+ left femoral, posterior tibial and dorsalis pedis pulses; no subclavian or femoral bruits Neurological: grossly nonfocal Psych: Normal mood and affect    ASSESSMENT & PLAN:    1. Chronic combined systolic and diastolic CHF, NYHA class 1 (HCC)   2. Persistent atrial fibrillation (HCC)   3. Long term current use of anticoagulant   4. Pacemaker- MDT implanted 12/26/12   5. SSS (sick sinus syndrome) (HCC)   6. Drug-induced hypotension   7. Encounter for monitoring  amiodarone therapy   8. Pure hypercholesterolemia      1. CHF: Whenever she has persistent atrial fibrillation she often deteriorates mostly due to tachycardia related cardiomyopathy and due to increased right ventricular pacing.  Doing well since the cardioversion without need for diuretic dose adjustment.  Activity level back to baseline. 2. AFib: Poorly tolerated when persistent.  No recurrence since cardioversion in October.  Now on amiodarone.  CHADSVasc 4-5 (age, gender, HTN, CHF, possible CAD).   3. Anticoagulation: Compliant with the Savaysa, has not had bleeding problems.  No longer covered by her insurance plan.  If her prior authorization does not work, can switch to either Eliquis or Xarelto. 4. PM: Normal device function.  Virtually 100% atrial paced, ventricular sensed rhythm since the cardioversion. 5. SSS: She has occasional PACs, but is otherwise "  atrially pacemaker dependent". 6. Hypotension: Does not tolerate higher diuretic doses.  Excellent blood pressure today. 7. Amiodarone: Most recent labs normal, liver and thyroid function test to be rechecked in April.  Discussed the potential for numerous side effects including thyroid disorder, hepatotoxicity, lung fibrosis, skin photosensitivity, ocular complications, multiple drug interactions, etc. discussed referring for EP evaluation and ablation, but she is now 80 years old and prefers less invasive approach. 8. HLP:  She has not tolerated potent statins or higher doses of pitavastatin.  Since she does not have significant CAD or PAD, and LDL cholesterol less than 409 is considered satisfactory.  Labs were last checked in June and all parameters were in desirable range.   COVID-19 Education: The signs and symptoms of COVID-19 were discussed with the patient and how to seek care for testing (follow up with PCP or arrange E-visit).  The importance of social distancing was discussed today.  Time:   Today, I have spent 22 minutes  with the patient with telehealth technology discussing the above problems.     Medication Adjustments/Labs and Tests Ordered: Current medicines are reviewed at length with the patient today.  Concerns regarding medicines are outlined above.   Tests Ordered: Orders Placed This Encounter  Procedures  . Comprehensive metabolic panel  . TSH    Medication Changes: Meds ordered this encounter  Medications  . potassium chloride (KLOR-CON) 10 MEQ tablet    Sig: Take 1 tablet (10 mEq total) by mouth daily.    Dispense:  90 tablet    Refill:  3    Dose change  . furosemide (LASIX) 20 MG tablet    Sig: Take 1 tablet (20 mg total) by mouth daily.    Dispense:  90 tablet    Refill:  3    Dose change   Patient Instructions  Medication Instructions:  No changes *If you need a refill on your cardiac medications before your next appointment, please call your pharmacy*   Lab Work: Your provider would like for you to return in April 2022 to have the following labs drawn: CMET and TSH. You do not need an appointment for the lab. Once in our office lobby there is a podium where you can sign in and ring the doorbell to alert [place] that you are here. The lab is open from 8:00 am to 4:30 pm; closed for lunch from 12:45pm-1:45pm.  If you have labs (blood work) drawn today and your tests are completely normal, you will receive your results only by: Marland Kitchen MyChart Message (if you have MyChart) OR . A paper copy in the mail If you have any lab test that is abnormal or we need to change your treatment, we will call you to review the results.   Testing/Procedures: None ordered   Follow-Up: At [facility], you and your health needs are our priority.  As part of our continuing mission to provide you with exceptional heart care, we have created designated Provider Care Teams.  These Care Teams include your primary Cardiologist (physician) and Advanced Practice Providers (APPs -  Physician Assistants and  Nurse Practitioners) who all work together to provide you with the care you need, when you need it.  We recommend signing up for the patient portal called "MyChart".  Sign up information is provided on this After Visit Summary.  MyChart is used to connect with patients for Virtual Visits (Telemedicine).  Patients are able to view lab/test results, encounter notes, upcoming appointments, etc.  Non-urgent messages  can be sent to your provider as well.   To learn more about what you can do with MyChart, go to [email or web address].    Your next appointment:   6 month(s)  The format for your next appointment:   In Person  Provider:   Thurmon FairMihai Ambika Zettlemoyer, MD        Disposition:  Follow up 3 months  Signed, Thurmon FairMihai Saumya Hukill, MD  01/18/2020 10:43 AM    [facility]

## 2020-01-18 ENCOUNTER — Other Ambulatory Visit: Payer: Self-pay

## 2020-01-18 ENCOUNTER — Encounter: Payer: Self-pay | Admitting: Cardiovascular Disease

## 2020-01-18 ENCOUNTER — Ambulatory Visit (INDEPENDENT_AMBULATORY_CARE_PROVIDER_SITE_OTHER): Payer: Medicare Other | Admitting: Cardiovascular Disease

## 2020-01-18 VITALS — BP 121/69 | HR 62 | Ht 62.0 in | Wt 150.6 lb

## 2020-01-18 DIAGNOSIS — I4819 Other persistent atrial fibrillation: Secondary | ICD-10-CM | POA: Diagnosis not present

## 2020-01-18 DIAGNOSIS — I495 Sick sinus syndrome: Secondary | ICD-10-CM

## 2020-01-18 DIAGNOSIS — Z950 Presence of cardiac pacemaker: Secondary | ICD-10-CM

## 2020-01-18 DIAGNOSIS — I5042 Chronic combined systolic (congestive) and diastolic (congestive) heart failure: Secondary | ICD-10-CM

## 2020-01-18 DIAGNOSIS — Z5181 Encounter for therapeutic drug level monitoring: Secondary | ICD-10-CM

## 2020-01-18 DIAGNOSIS — I952 Hypotension due to drugs: Secondary | ICD-10-CM

## 2020-01-18 DIAGNOSIS — Z79899 Other long term (current) drug therapy: Secondary | ICD-10-CM

## 2020-01-18 DIAGNOSIS — Z7901 Long term (current) use of anticoagulants: Secondary | ICD-10-CM | POA: Diagnosis not present

## 2020-01-18 DIAGNOSIS — E7800 Pure hypercholesterolemia, unspecified: Secondary | ICD-10-CM

## 2020-01-18 MED ORDER — POTASSIUM CHLORIDE ER 10 MEQ PO TBCR: 10.0000 meq | EXTENDED_RELEASE_TABLET | Freq: Every day | ORAL | 3 refills | Status: DC

## 2020-01-18 MED ORDER — FUROSEMIDE 20 MG PO TABS: 20.0000 mg | ORAL_TABLET | Freq: Every day | ORAL | 3 refills | Status: DC

## 2020-01-18 NOTE — Patient Instructions (Signed)
Medication Instructions:  No changes *If you need a refill on your cardiac medications before your next appointment, please call your pharmacy*   Lab Work: Your provider would like for you to return in April 2022 to have the following labs drawn: CMET and TSH. You do not need an appointment for the lab. Once in our office lobby there is a podium where you can sign in and ring the doorbell to alert [place] that you are here. The lab is open from 8:00 am to 4:30 pm; closed for lunch from 12:45pm-1:45pm.  If you have labs (blood work) drawn today and your tests are completely normal, you will receive your results only by: Marland Kitchen MyChart Message (if you have MyChart) OR . A paper copy in the mail If you have any lab test that is abnormal or we need to change your treatment, we will call you to review the results.   Testing/Procedures: None ordered   Follow-Up: At [facility], you and your health needs are our priority.  As part of our continuing mission to provide you with exceptional heart care, we have created designated Provider Care Teams.  These Care Teams include your primary Cardiologist (physician) and Advanced Practice Providers (APPs -  Physician Assistants and Nurse Practitioners) who all work together to provide you with the care you need, when you need it.  We recommend signing up for the patient portal called "MyChart".  Sign up information is provided on this After Visit Summary.  MyChart is used to connect with patients for Virtual Visits (Telemedicine).  Patients are able to view lab/test results, encounter notes, upcoming appointments, etc.  Non-urgent messages can be sent to your provider as well.   To learn more about what you can do with MyChart, go to [email or web address].    Your next appointment:   6 month(s)  The format for your next appointment:   In Person  Provider:   Thurmon Fair, MD

## 2020-01-21 NOTE — Addendum Note (Signed)
Addended by: Brunetta Genera on: 01/21/2020 04:36 PM   Modules accepted: Orders

## 2020-03-30 ENCOUNTER — Other Ambulatory Visit: Payer: Self-pay

## 2020-03-30 ENCOUNTER — Ambulatory Visit
Admission: RE | Admit: 2020-03-30 | Discharge: 2020-03-30 | Disposition: A | Discharge status: Home / Self Care | Payer: Medicare Other | Source: Ambulatory Visit | Attending: Physician Assistant | Admitting: Physician Assistant

## 2020-03-30 DIAGNOSIS — M8589 Other specified disorders of bone density and structure, multiple sites: Secondary | ICD-10-CM

## 2020-04-01 ENCOUNTER — Other Ambulatory Visit: Payer: Medicare Other

## 2020-04-06 ENCOUNTER — Other Ambulatory Visit: Payer: Self-pay | Admitting: Physician Assistant

## 2020-04-14 ENCOUNTER — Other Ambulatory Visit: Payer: Self-pay

## 2020-04-14 MED ORDER — AMIODARONE HCL 200 MG PO TABS: 200.0000 mg | ORAL_TABLET | Freq: Every day | ORAL | 3 refills | Status: DC

## 2020-04-21 ENCOUNTER — Telehealth: Payer: Self-pay | Admitting: Cardiovascular Disease

## 2020-04-21 DIAGNOSIS — I4819 Other persistent atrial fibrillation: Secondary | ICD-10-CM

## 2020-04-21 MED ORDER — ELIQUIS 5 MG PO TABS: 5.0000 mg | ORAL_TABLET | Freq: Two times a day (BID) | ORAL | 1 refills | Status: DC

## 2020-04-21 NOTE — Telephone Encounter (Signed)
Should not be any problem switching from [facility] to Eliquis since she takes it for atrial fibrillation.  Eliquis: Indication: A Fib Last office visit: 1 /10/22 Scr: 1.45 Age: 80 Weight: 150  Patient's dose would be 5 mg BID.    Do not recommend her taking her husband's Eliquis since savaysa is a once a day medication and Eliquis is twice a day.  She can complete her Sayasa and then start Eliquis at the time her next scheduled dose of Savaysa would be due.  Rx for Eliquis sent to pharmacy

## 2020-04-21 NOTE — Telephone Encounter (Signed)
Thank you for taking care of that.

## 2020-04-21 NOTE — Telephone Encounter (Signed)
Will route to pharmd pool 

## 2020-04-21 NOTE — Telephone Encounter (Signed)
Spoke to pt who state she only have 2 days left of Savaysa but was just informed her insurance will no longer cover medication. Instead, they would cover Eliquis. Pt is questioning if MD feels she should switch to Eliquis.  Will route for recommendations.

## 2020-04-21 NOTE — Telephone Encounter (Signed)
Pt updated and verbalized understanding.  

## 2020-04-21 NOTE — Telephone Encounter (Signed)
Pt c/o medication issue:  1. Name of Medication: SAVAYSA 30 MG TABS tablet  2. How are you currently taking this medication (dosage and times per day)? As prescribed  3. Are you having a reaction (difficulty breathing--STAT)? No   4. What is your medication issue?   Patient states her insurance will no longer cover this medication and they are recommending Eliquis. She states her husband takes Eliquis 5 MG and she would like to know if it is alright for her to take it as well. If so, she would like to know if she is able to take one of her husbands tablets daily until she can get a prescription of her own. Please advise.

## 2020-04-27 ENCOUNTER — Other Ambulatory Visit: Payer: Self-pay | Admitting: *Deleted

## 2020-04-27 MED ORDER — ELIQUIS 5 MG PO TABS: 5.0000 mg | ORAL_TABLET | Freq: Two times a day (BID) | ORAL | 0 refills | Status: DC

## 2020-04-27 NOTE — Telephone Encounter (Signed)
Prescription refill request for Eliquis received. Indication: Atrial Fib Last office visit: 01/18/20 Scr: 1.45 on 10/20/19 Age: 80 Weight: 68.3kg  Based on above findings Eliquis 5mg  twice daily is the appropriate dose.  Pt is due for labs April 2022 ordered by Dr C.  Refill approved x1 until labs are back.

## 2020-05-03 ENCOUNTER — Other Ambulatory Visit: Payer: Self-pay

## 2020-05-03 ENCOUNTER — Ambulatory Visit: Payer: Medicare Other | Attending: Internal Medicine

## 2020-05-03 ENCOUNTER — Other Ambulatory Visit (HOSPITAL_BASED_OUTPATIENT_CLINIC_OR_DEPARTMENT_OTHER): Payer: Self-pay

## 2020-05-03 DIAGNOSIS — Z23 Encounter for immunization: Secondary | ICD-10-CM

## 2020-05-03 MED ORDER — PFIZER-BIONT COVID-19 VAC-TRIS 30 MCG/0.3ML IM SUSP
INTRAMUSCULAR | 0 refills | Status: DC
  Filled 2020-05-03: qty 0.3, 1d supply, fill #0

## 2020-05-03 NOTE — Progress Notes (Signed)
   Covid-19 Vaccination Clinic  Name:  Monica Harvey    MRN: [id number] DOB: 1940/11/08  05/03/2020  [name] was observed post Covid-19 immunization for 15 minutes without incident. She was provided with Vaccine Information Sheet and instruction to access the V-Safe system.   [name] was instructed to call 911 with any severe reactions post vaccine: Marland Kitchen Difficulty breathing  . Swelling of face and throat  . A fast heartbeat  . A bad rash all over body  . Dizziness and weakness   Immunizations Administered    Name Date Dose VIS Date Route   PFIZER Comrnaty(Gray TOP) Covid-19 Vaccine 05/03/2020 11:48 AM 0.3 mL 12/17/2019 Intramuscular   Manufacturer: [employer], [employer]   Lot: [id number]   NDC: [phone]

## 2020-05-09 ENCOUNTER — Encounter: Payer: Self-pay | Admitting: *Deleted

## 2020-05-17 ENCOUNTER — Telehealth: Payer: Self-pay | Admitting: Cardiovascular Disease

## 2020-05-17 MED ORDER — ELIQUIS 5 MG PO TABS: 5.0000 mg | ORAL_TABLET | Freq: Two times a day (BID) | ORAL | 1 refills | Status: DC

## 2020-05-17 NOTE — Telephone Encounter (Signed)
*  STAT* If patient is at the pharmacy, call can be transferred to refill team.   1. Which medications need to be refilled? (please list name of each medication and dose if known) apixaban (ELIQUIS) 5 MG TABS tablet  2. Which pharmacy/location (including street and city if local pharmacy) is medication to be sent to? [facility] [place], [place] - [address] AT Portal to Registered [employer] Sites  3. Do they need a 30 day or 90 day supply? 90  Patient states its cheap for it to go through the mail service order

## 2020-05-17 NOTE — Telephone Encounter (Signed)
71f, 68.3kg, scr 1.45 10/20/19, lovw/croitoru 01/18/20

## 2020-06-23 ENCOUNTER — Ambulatory Visit (INDEPENDENT_AMBULATORY_CARE_PROVIDER_SITE_OTHER): Payer: Medicare Other

## 2020-06-23 DIAGNOSIS — I495 Sick sinus syndrome: Secondary | ICD-10-CM | POA: Diagnosis not present

## 2020-06-26 LAB — CUP PACEART REMOTE DEVICE CHECK
Battery Remaining Longevity: 29 mo
Battery Voltage: 2.94 V
Brady Statistic AP VP Percent: 0.05 %
Brady Statistic AP VS Percent: 99.14 %
Brady Statistic AS VP Percent: 0 %
Brady Statistic AS VS Percent: 0.81 %
Brady Statistic RA Percent Paced: 99.19 %
Brady Statistic RV Percent Paced: 0.05 %
Date Time Interrogation Session: 20220617165511
Implantable Lead Implant Date: 20141219
Implantable Lead Implant Date: 20141219
Implantable Lead Location: 753859
Implantable Lead Location: 753860
Implantable Lead Model: 5076
Implantable Lead Model: 5076
Implantable Pulse Generator Implant Date: 20141219
Lead Channel Impedance Value: 361 Ohm
Lead Channel Impedance Value: 380 Ohm
Lead Channel Impedance Value: 418 Ohm
Lead Channel Impedance Value: 437 Ohm
Lead Channel Pacing Threshold Amplitude: 0.5 V
Lead Channel Pacing Threshold Amplitude: 1 V
Lead Channel Pacing Threshold Pulse Width: 0.4 ms
Lead Channel Pacing Threshold Pulse Width: 0.4 ms
Lead Channel Sensing Intrinsic Amplitude: 3.875 mV
Lead Channel Sensing Intrinsic Amplitude: 3.875 mV
Lead Channel Sensing Intrinsic Amplitude: 6.125 mV
Lead Channel Sensing Intrinsic Amplitude: 6.125 mV
Lead Channel Setting Pacing Amplitude: 2 V
Lead Channel Setting Pacing Amplitude: 2.5 V
Lead Channel Setting Pacing Pulse Width: 0.4 ms
Lead Channel Setting Sensing Sensitivity: 0.9 mV

## 2020-07-14 NOTE — Progress Notes (Signed)
Remote pacemaker transmission.   

## 2020-07-15 LAB — COMPREHENSIVE METABOLIC PANEL
ALT: 19 IU/L (ref 0–32)
AST: 20 IU/L (ref 0–40)
Albumin/Globulin Ratio: 1.9 (ref 1.2–2.2)
Albumin: 4.5 g/dL (ref 3.7–4.7)
Alkaline Phosphatase: 181 IU/L — ABNORMAL HIGH (ref 44–121)
BUN/Creatinine Ratio: 16 (ref 12–28)
BUN: 26 mg/dL (ref 8–27)
Bilirubin Total: 0.3 mg/dL (ref 0.0–1.2)
CO2: 20 mmol/L (ref 20–29)
Calcium: 9.7 mg/dL (ref 8.7–10.3)
Chloride: 103 mmol/L (ref 96–106)
Creatinine, Ser: 1.63 mg/dL — ABNORMAL HIGH (ref 0.57–1.00)
Globulin, Total: 2.4 g/dL (ref 1.5–4.5)
Glucose: 115 mg/dL — ABNORMAL HIGH (ref 65–99)
Potassium: 4.7 mmol/L (ref 3.5–5.2)
Sodium: 141 mmol/L (ref 134–144)
Total Protein: 6.9 g/dL (ref 6.0–8.5)
eGFR: 32 mL/min/{1.73_m2} — ABNORMAL LOW (ref >59)

## 2020-07-15 LAB — TSH: TSH: 3.02 u[IU]/mL (ref 0.450–4.500)

## 2020-07-27 ENCOUNTER — Other Ambulatory Visit: Payer: Self-pay | Admitting: Cardiovascular Disease

## 2020-10-06 ENCOUNTER — Ambulatory Visit (INDEPENDENT_AMBULATORY_CARE_PROVIDER_SITE_OTHER): Payer: Medicare Other

## 2020-10-06 DIAGNOSIS — I495 Sick sinus syndrome: Secondary | ICD-10-CM

## 2020-10-06 LAB — CUP PACEART REMOTE DEVICE CHECK
Battery Remaining Longevity: 28 mo
Battery Voltage: 2.94 V
Brady Statistic AP VP Percent: 0.07 %
Brady Statistic AP VS Percent: 99.36 %
Brady Statistic AS VP Percent: 0 %
Brady Statistic AS VS Percent: 0.57 %
Brady Statistic RA Percent Paced: 99.42 %
Brady Statistic RV Percent Paced: 0.07 %
Date Time Interrogation Session: 20220929090138
Implantable Lead Implant Date: 20141219
Implantable Lead Implant Date: 20141219
Implantable Lead Location: 753859
Implantable Lead Location: 753860
Implantable Lead Model: 5076
Implantable Lead Model: 5076
Implantable Pulse Generator Implant Date: 20141219
Lead Channel Impedance Value: 380 Ohm
Lead Channel Impedance Value: 380 Ohm
Lead Channel Impedance Value: 418 Ohm
Lead Channel Impedance Value: 437 Ohm
Lead Channel Pacing Threshold Amplitude: 0.5 V
Lead Channel Pacing Threshold Amplitude: 1.25 V
Lead Channel Pacing Threshold Pulse Width: 0.4 ms
Lead Channel Pacing Threshold Pulse Width: 0.4 ms
Lead Channel Sensing Intrinsic Amplitude: 4.25 mV
Lead Channel Sensing Intrinsic Amplitude: 4.25 mV
Lead Channel Sensing Intrinsic Amplitude: 6.125 mV
Lead Channel Sensing Intrinsic Amplitude: 6.125 mV
Lead Channel Setting Pacing Amplitude: 2 V
Lead Channel Setting Pacing Amplitude: 2.5 V
Lead Channel Setting Pacing Pulse Width: 0.4 ms
Lead Channel Setting Sensing Sensitivity: 0.9 mV

## 2020-10-14 NOTE — Progress Notes (Signed)
Remote pacemaker transmission.   

## 2020-11-16 ENCOUNTER — Other Ambulatory Visit: Payer: Self-pay | Admitting: Physician Assistant

## 2020-11-16 ENCOUNTER — Other Ambulatory Visit: Payer: Self-pay | Admitting: Cardiovascular Disease

## 2020-11-17 NOTE — Telephone Encounter (Signed)
This is Dr. Croitoru's pt 

## 2020-11-17 NOTE — Telephone Encounter (Signed)
Prescription refill request for Eliquis received. Indication:Afib Last office visit:1/22 Scr:1.6 Age:80  Weight:68.3 kg  Prescription refilled

## 2020-12-05 ENCOUNTER — Telehealth: Payer: Self-pay | Admitting: Cardiovascular Disease

## 2020-12-05 DIAGNOSIS — I5042 Chronic combined systolic (congestive) and diastolic (congestive) heart failure: Secondary | ICD-10-CM

## 2020-12-05 MED ORDER — FUROSEMIDE 40 MG PO TABS: 40.0000 mg | ORAL_TABLET | Freq: Every day | ORAL | 3 refills | Status: DC

## 2020-12-05 MED ORDER — POTASSIUM CHLORIDE ER 20 MEQ PO TBCR: 20.0000 meq | EXTENDED_RELEASE_TABLET | Freq: Every day | ORAL | 3 refills | Status: DC

## 2020-12-05 NOTE — Telephone Encounter (Signed)
Returned call to patient to discuss-patient states about 2 weeks ago she started noticing swelling in her abdomen, decrease in urination, and increased fatigue.   Denies SOB, LE edema.  Denies increase in sodium intake.  Does not weigh daily.   She states she does not think this was related to an episode of Afib as she normally can tell when she is in afib.   She reports increasing her lasix to 40 mg x 4 days and reports swelling has decreased and felling much better.   She did not increase her potassium.  She is requesting to continue 40 mg daily if ok per Dr. Royann Shivers.    Advised would route to MD to review.

## 2020-12-05 NOTE — Telephone Encounter (Signed)
Patient has been made aware. New prescriptions have been sent in. Lab orders placed. She will come to the office next week for labs.  Croitoru, Mihai, MD  You; Cedric Fishman, Mayme Genta A, RN 47 minutes ago (3:11 PM)   OK to refill furosemide and continue at 40 mg daily. Please increase KCl to 20 mEq daily. Check BMET and BNP in a week after adjusting KCl dose please.

## 2020-12-05 NOTE — Telephone Encounter (Signed)
Pt c/o medication issue:  1. Name of Medication:  potassium chloride (KLOR-CON) 10 MEQ tablet TAKE 1 TABLET DAILY. DOSE CHANGE  furosemide furosemide (LASIX) 20 MG tablet  2. How are you currently taking this medication (dosage and times per day)? Listed above  3. Are you having a reaction (difficulty breathing--STAT)? no  4. What is your medication issue? Pt states that this is not a strong enough dose needs an increase please advise.

## 2020-12-13 LAB — BASIC METABOLIC PANEL
BUN/Creatinine Ratio: 12 (ref 12–28)
BUN: 23 mg/dL (ref 8–27)
CO2: 25 mmol/L (ref 20–29)
Calcium: 9.9 mg/dL (ref 8.7–10.3)
Chloride: 101 mmol/L (ref 96–106)
Creatinine, Ser: 1.88 mg/dL — ABNORMAL HIGH (ref 0.57–1.00)
Glucose: 92 mg/dL (ref 70–99)
Potassium: 5 mmol/L (ref 3.5–5.2)
Sodium: 141 mmol/L (ref 134–144)
eGFR: 27 mL/min/{1.73_m2} — ABNORMAL LOW (ref >59)

## 2020-12-13 LAB — BRAIN NATRIURETIC PEPTIDE: BNP: 41.7 pg/mL (ref 0.0–100.0)

## 2021-01-03 ENCOUNTER — Other Ambulatory Visit: Payer: Self-pay | Admitting: *Deleted

## 2021-01-03 MED ORDER — FUROSEMIDE 20 MG PO TABS: ORAL_TABLET | ORAL | 3 refills | Status: DC

## 2021-01-03 MED ORDER — POTASSIUM CHLORIDE ER 10 MEQ PO TBCR: 10.0000 meq | EXTENDED_RELEASE_TABLET | Freq: Every day | ORAL | 3 refills | Status: DC

## 2021-01-30 ENCOUNTER — Other Ambulatory Visit: Payer: Self-pay | Admitting: Cardiovascular Disease

## 2021-04-06 ENCOUNTER — Ambulatory Visit (INDEPENDENT_AMBULATORY_CARE_PROVIDER_SITE_OTHER): Payer: Medicare Other

## 2021-04-06 DIAGNOSIS — I495 Sick sinus syndrome: Secondary | ICD-10-CM | POA: Diagnosis not present

## 2021-04-12 LAB — CUP PACEART REMOTE DEVICE CHECK
Battery Remaining Longevity: 23 mo
Battery Voltage: 2.92 V
Brady Statistic AP VP Percent: 0.09 %
Brady Statistic AP VS Percent: 99.37 %
Brady Statistic AS VP Percent: 0 %
Brady Statistic AS VS Percent: 0.55 %
Brady Statistic RA Percent Paced: 99.45 %
Brady Statistic RV Percent Paced: 0.09 %
Date Time Interrogation Session: 20230404121626
Implantable Lead Implant Date: 20141219
Implantable Lead Implant Date: 20141219
Implantable Lead Location: 753859
Implantable Lead Location: 753860
Implantable Lead Model: 5076
Implantable Lead Model: 5076
Implantable Pulse Generator Implant Date: 20141219
Lead Channel Impedance Value: 361 Ohm
Lead Channel Impedance Value: 361 Ohm
Lead Channel Impedance Value: 418 Ohm
Lead Channel Impedance Value: 437 Ohm
Lead Channel Pacing Threshold Amplitude: 0.5 V
Lead Channel Pacing Threshold Amplitude: 1.125 V
Lead Channel Pacing Threshold Pulse Width: 0.4 ms
Lead Channel Pacing Threshold Pulse Width: 0.4 ms
Lead Channel Sensing Intrinsic Amplitude: 3.5 mV
Lead Channel Sensing Intrinsic Amplitude: 3.5 mV
Lead Channel Sensing Intrinsic Amplitude: 5.125 mV
Lead Channel Sensing Intrinsic Amplitude: 5.125 mV
Lead Channel Setting Pacing Amplitude: 2 V
Lead Channel Setting Pacing Amplitude: 2.5 V
Lead Channel Setting Pacing Pulse Width: 0.4 ms
Lead Channel Setting Sensing Sensitivity: 0.9 mV

## 2021-04-18 NOTE — Progress Notes (Signed)
Remote pacemaker transmission.   

## 2021-05-08 ENCOUNTER — Other Ambulatory Visit: Payer: Self-pay | Admitting: Cardiovascular Disease

## 2021-05-18 ENCOUNTER — Other Ambulatory Visit: Payer: Self-pay | Admitting: Family Medicine

## 2021-05-18 DIAGNOSIS — Z1231 Encounter for screening mammogram for malignant neoplasm of breast: Secondary | ICD-10-CM

## 2021-05-30 ENCOUNTER — Ambulatory Visit
Admission: RE | Admit: 2021-05-30 | Discharge: 2021-05-30 | Disposition: A | Discharge status: Home / Self Care | Payer: Medicare Other | Source: Ambulatory Visit | Attending: Family Medicine | Admitting: Family Medicine

## 2021-05-30 DIAGNOSIS — Z1231 Encounter for screening mammogram for malignant neoplasm of breast: Secondary | ICD-10-CM

## 2021-06-01 ENCOUNTER — Other Ambulatory Visit: Payer: Self-pay | Admitting: Family Medicine

## 2021-06-01 DIAGNOSIS — R928 Other abnormal and inconclusive findings on diagnostic imaging of breast: Secondary | ICD-10-CM

## 2021-06-02 NOTE — Progress Notes (Signed)
This is not our patient and I am not sure why the results came to [place] however she needs to have a repeat mammogram per radiology recommendations.  We make sure that this is getting done and she needs to establish with a PCP ASAP.

## 2021-06-07 ENCOUNTER — Ambulatory Visit: Payer: Medicare Other

## 2021-06-07 ENCOUNTER — Ambulatory Visit
Admission: RE | Admit: 2021-06-07 | Discharge: 2021-06-07 | Disposition: A | Discharge status: Home / Self Care | Payer: Medicare Other | Source: Ambulatory Visit | Attending: Family Medicine | Admitting: Family Medicine

## 2021-06-07 DIAGNOSIS — R928 Other abnormal and inconclusive findings on diagnostic imaging of breast: Secondary | ICD-10-CM

## 2021-06-08 ENCOUNTER — Other Ambulatory Visit: Payer: Self-pay | Admitting: Cardiovascular Disease

## 2021-06-26 ENCOUNTER — Telehealth: Payer: Self-pay | Admitting: Cardiovascular Disease

## 2021-06-26 NOTE — Telephone Encounter (Signed)
*  STAT* If patient is at the pharmacy, call can be transferred to refill team.   1. Which medications need to be refilled? (please list name of each medication and dose if known)  irbesartan (AVAPRO) 150 MG tablet 2. Which pharmacy/location (including street and city if local pharmacy) is medication to be sent to?[facility] [place], [place] - [address] [facility] AT Portal to Registered [employer] Sites  3. Do they need a 30 day or 90 day supply? 90

## 2021-06-27 MED ORDER — IRBESARTAN 150 MG PO TABS: 150.0000 mg | ORAL_TABLET | Freq: Every day | ORAL | 0 refills | Status: DC

## 2021-07-06 ENCOUNTER — Ambulatory Visit (INDEPENDENT_AMBULATORY_CARE_PROVIDER_SITE_OTHER): Payer: Medicare Other

## 2021-07-06 DIAGNOSIS — I495 Sick sinus syndrome: Secondary | ICD-10-CM | POA: Diagnosis not present

## 2021-07-07 ENCOUNTER — Other Ambulatory Visit: Payer: Self-pay | Admitting: Cardiovascular Disease

## 2021-07-09 LAB — CUP PACEART REMOTE DEVICE CHECK
Battery Remaining Longevity: 18 mo
Battery Voltage: 2.91 V
Brady Statistic AP VP Percent: 0.11 %
Brady Statistic AP VS Percent: 99.39 %
Brady Statistic AS VP Percent: 0 %
Brady Statistic AS VS Percent: 0.5 %
Brady Statistic RA Percent Paced: 99.5 %
Brady Statistic RV Percent Paced: 0.11 %
Date Time Interrogation Session: 20230630142526
Implantable Lead Implant Date: 20141219
Implantable Lead Implant Date: 20141219
Implantable Lead Location: 753859
Implantable Lead Location: 753860
Implantable Lead Model: 5076
Implantable Lead Model: 5076
Implantable Pulse Generator Implant Date: 20141219
Lead Channel Impedance Value: 361 Ohm
Lead Channel Impedance Value: 361 Ohm
Lead Channel Impedance Value: 418 Ohm
Lead Channel Impedance Value: 418 Ohm
Lead Channel Pacing Threshold Amplitude: 0.625 V
Lead Channel Pacing Threshold Amplitude: 1.25 V
Lead Channel Pacing Threshold Pulse Width: 0.4 ms
Lead Channel Pacing Threshold Pulse Width: 0.4 ms
Lead Channel Sensing Intrinsic Amplitude: 4.25 mV
Lead Channel Sensing Intrinsic Amplitude: 4.25 mV
Lead Channel Sensing Intrinsic Amplitude: 5.375 mV
Lead Channel Sensing Intrinsic Amplitude: 5.375 mV
Lead Channel Setting Pacing Amplitude: 2 V
Lead Channel Setting Pacing Amplitude: 2.5 V
Lead Channel Setting Pacing Pulse Width: 0.4 ms
Lead Channel Setting Sensing Sensitivity: 0.9 mV

## 2021-07-18 ENCOUNTER — Telehealth: Payer: Self-pay | Admitting: Cardiovascular Disease

## 2021-07-18 NOTE — Telephone Encounter (Signed)
*  STAT* If patient is at the pharmacy, call can be transferred to refill team.   1. Which medications need to be refilled? (please list name of each medication and dose if known) Amiodarone and Diltiazem        2. Which pharmacy/location (including street and city if local pharmacy) is medication to be sent to?[facility]  3. Do they need a 30 day or 90 day supply?  Patient insurance will not refill or pay for  her medicine unless it for 90 days She already have an appointment with Dr C for 07-31-21

## 2021-07-19 MED ORDER — AMIODARONE HCL 200 MG PO TABS: 200.0000 mg | ORAL_TABLET | Freq: Every day | ORAL | 0 refills | Status: DC

## 2021-07-19 MED ORDER — DILTIAZEM HCL ER COATED BEADS 120 MG PO CP24: 120.0000 mg | ORAL_CAPSULE | Freq: Every day | ORAL | 0 refills | Status: DC

## 2021-07-20 ENCOUNTER — Other Ambulatory Visit: Payer: Self-pay | Admitting: Cardiovascular Disease

## 2021-07-24 NOTE — Progress Notes (Signed)
Remote pacemaker transmission.   

## 2021-07-26 ENCOUNTER — Ambulatory Visit: Payer: Medicare Other | Attending: Internal Medicine

## 2021-07-26 DIAGNOSIS — Z23 Encounter for immunization: Secondary | ICD-10-CM

## 2021-07-28 ENCOUNTER — Other Ambulatory Visit (HOSPITAL_BASED_OUTPATIENT_CLINIC_OR_DEPARTMENT_OTHER): Payer: Self-pay

## 2021-07-28 MED ORDER — PFIZER COVID-19 VAC BIVALENT 30 MCG/0.3ML IM SUSP
INTRAMUSCULAR | 0 refills | Status: DC
  Filled 2021-07-28: qty 0.3, 1d supply, fill #0

## 2021-07-31 ENCOUNTER — Encounter: Payer: Self-pay | Admitting: Cardiovascular Disease

## 2021-07-31 ENCOUNTER — Ambulatory Visit (INDEPENDENT_AMBULATORY_CARE_PROVIDER_SITE_OTHER): Payer: Medicare Other | Admitting: Cardiovascular Disease

## 2021-07-31 ENCOUNTER — Other Ambulatory Visit: Payer: Self-pay | Admitting: Cardiovascular Disease

## 2021-07-31 VITALS — BP 106/74 | HR 66 | Ht 62.0 in | Wt 156.0 lb

## 2021-07-31 DIAGNOSIS — I5032 Chronic diastolic (congestive) heart failure: Secondary | ICD-10-CM | POA: Diagnosis not present

## 2021-07-31 DIAGNOSIS — E7800 Pure hypercholesterolemia, unspecified: Secondary | ICD-10-CM | POA: Diagnosis not present

## 2021-07-31 DIAGNOSIS — I480 Paroxysmal atrial fibrillation: Secondary | ICD-10-CM | POA: Diagnosis not present

## 2021-07-31 DIAGNOSIS — Z950 Presence of cardiac pacemaker: Secondary | ICD-10-CM | POA: Diagnosis not present

## 2021-07-31 DIAGNOSIS — D6869 Other thrombophilia: Secondary | ICD-10-CM

## 2021-07-31 DIAGNOSIS — Z5181 Encounter for therapeutic drug level monitoring: Secondary | ICD-10-CM

## 2021-07-31 DIAGNOSIS — Z79899 Other long term (current) drug therapy: Secondary | ICD-10-CM

## 2021-07-31 DIAGNOSIS — I495 Sick sinus syndrome: Secondary | ICD-10-CM

## 2021-07-31 MED ORDER — IRBESARTAN 150 MG PO TABS: 150.0000 mg | ORAL_TABLET | Freq: Every day | ORAL | 3 refills | Status: DC

## 2021-07-31 MED ORDER — POTASSIUM CHLORIDE ER 10 MEQ PO TBCR: 10.0000 meq | EXTENDED_RELEASE_TABLET | Freq: Every day | ORAL | 3 refills | Status: DC

## 2021-07-31 MED ORDER — APIXABAN 5 MG PO TABS: 5.0000 mg | ORAL_TABLET | Freq: Two times a day (BID) | ORAL | 3 refills | Status: DC

## 2021-07-31 MED ORDER — FUROSEMIDE 20 MG PO TABS: ORAL_TABLET | ORAL | 3 refills | Status: DC

## 2021-07-31 MED ORDER — DILTIAZEM HCL ER COATED BEADS 120 MG PO CP24: 120.0000 mg | ORAL_CAPSULE | Freq: Every day | ORAL | 3 refills | Status: DC

## 2021-07-31 MED ORDER — LIVALO 1 MG PO TABS: 1.0000 | ORAL_TABLET | Freq: Every day | ORAL | 3 refills | Status: DC

## 2021-07-31 NOTE — Progress Notes (Signed)
   Covid-19 Vaccination Clinic  Name:  Monica Harvey    MRN: [id number] DOB: 06-06-1940  07/31/2021  [name] was observed post Covid-19 immunization for 15 minutes without incident. She was provided with Vaccine Information Sheet and instruction to access the V-Safe system.   [name] was instructed to call 911 with any severe reactions post vaccine: Difficulty breathing  Swelling of face and throat  A fast heartbeat  A bad rash all over body  Dizziness and weakness   Immunizations Administered     Name Date Dose VIS Date Route   Pfizer Covid-19 Vaccine Bivalent Booster 07/26/2021 11:47 AM 0.3 mL 09/07/2020 Intramuscular   Manufacturer: [employer], [employer]   Lot: [id number]   NDC: [phone]

## 2021-07-31 NOTE — Patient Instructions (Signed)
Medication Instructions:  No changes *If you need a refill on your cardiac medications before your next appointment, please call your pharmacy*   Lab Work: Your provider would like for you to have the following labs today: TSH, CMET, and Lipid  If you have labs (blood work) drawn today and your tests are completely normal, you will receive your results only by: MyChart Message (if you have MyChart) OR A paper copy in the mail If you have any lab test that is abnormal or we need to change your treatment, we will call you to review the results.   Testing/Procedures: None ordered   Follow-Up: At [facility], you and your health needs are our priority.  As part of our continuing mission to provide you with exceptional heart care, we have created designated Provider Care Teams.  These Care Teams include your primary Cardiologist (physician) and Advanced Practice Providers (APPs -  Physician Assistants and Nurse Practitioners) who all work together to provide you with the care you need, when you need it.  We recommend signing up for the patient portal called "MyChart".  Sign up information is provided on this After Visit Summary.  MyChart is used to connect with patients for Virtual Visits (Telemedicine).  Patients are able to view lab/test results, encounter notes, upcoming appointments, etc.  Non-urgent messages can be sent to your provider as well.   To learn more about what you can do with MyChart, go to [email or web address].    Your next appointment:   12 month(s)  The format for your next appointment:   In Person  Provider:   Thurmon Fair, MD {   Important Information About Sugar

## 2021-07-31 NOTE — Progress Notes (Signed)
Cardiology office note    Date:  07/31/2021   ID:  Monica Harvey, DOB 02-Jan-1941, MRN [id number]  PCP:  Pcp, No  Cardiologist:  Thurmon Fair, MD  Electrophysiologist:  None   Evaluation Performed:  Follow-Up Visit  Chief Complaint: Pacemaker check/atrial fibrillation  History of Present Illness:    Monica Harvey is a 81 y.o. female with longstanding tachycardia-bradycardia syndrome, dual-chamber permanent pacemaker (Medtronic), recurrent episodes of persistent atrial fibrillation with previous history of heart failure decompensation due to tachycardia related cardiomyopathy, improved following rhythm correction, chronic left bundle branch block.  She had reasonably good rate control sotalol for several years after which she had multiple breakthrough events and was transitioned to amiodarone about 2 years ago.  She has not had any significant recurrent atrial fibrillation since cardioversion October 23, 2019.  She generally feels well, but bemoans the fact that she has aged and does not have the same work potential as she did in the past.  She has not had palpitations, dizziness or syncope.  She denies exertional dyspnea, orthopnea, PND, lower extremity edema.  She has not had any focal neurological events.  She is compliant with anticoagulation with Eliquis and has not had any bleeding events.  Device interrogation shows that her Medtronic advisor dual-chamber device implanted to 2014 still has roughly 1.5 years of remaining longevity.  She has greater than 99% atrial pacing and less than 0.1% ventricular pacing.  She has not had any atrial fibrillation in the last 12 months and has only had 8 minutes of atrial fibrillation in the time since her last cardioversion.  Her most recent echocardiogram from October 2020 shows near normalization of left ventricular systolic function with an EF that is now 50-55%.  Subtle reduction in EF is due to LBBB related dyssynchrony.  EF has improved  compared to July 2019 (EF 45-50%), further improved on echo October 2020 (EF 50-55%).  Diastolic function parameters suggest normal left ventricular filling pressures.    Past Medical History:  Diagnosis Date   Allergy    Anginal pain (HCC)    Chronic anticoagulation, CHAD2Vasc score 4 02/09/2014   Chronic diastolic CHF (congestive heart failure) (HCC)    a. 01/2014 Echo: EF 50-55%, Gr1 DD, mild AI/MR.   Chronic kidney disease    First degree atrioventricular block    GERD (gastroesophageal reflux disease)    Heart murmur    HTN (hypertension)    Hyperlipidemia    LBBB (left bundle branch block)    Osteoporosis    Osteopenia   PAF (paroxysmal atrial fibrillation) (HCC)    a. Previously on flecainide;  b. 01/2014 sotalol loaded and dccv performed;  c. Chronic Savaysa Rx.   Presence of permanent cardiac pacemaker    a. 12/26/12 s/p MDT DC PPM.   SSS (sick sinus syndrome) (HCC)    a. s/p PPM-Medtronic placed 12/26/12    Symptomatic sinus bradycardia    Past Surgical History:  Procedure Laterality Date   APPENDECTOMY  1971   BREAST EXCISIONAL BIOPSY Right    CARDIOVERSION N/A 01/22/2014   Procedure: CARDIOVERSION;  Surgeon: Chrystie Nose, MD;  Location: [facility] ENDOSCOPY;  Service: Cardiovascular;  Laterality: N/A;   CARDIOVERSION N/A 04/10/2017   Procedure: CARDIOVERSION;  Surgeon: Jake Bathe, MD;  Location: [facility] ENDOSCOPY;  Service: Cardiovascular;  Laterality: N/A;   CARDIOVERSION N/A 10/23/2019   Procedure: CARDIOVERSION;  Surgeon: Thurmon Fair, MD;  Location: [facility] ENDOSCOPY;  Service: Cardiovascular;  Laterality: N/A;   INSERT /  REPLACE / REMOVE PACEMAKER  12/26/2012   Medtronic, Dr. Royann Shivers   NM MYOCAR PERF WALL MOTION  08/05/2009   small to mod. reversible anteroseptal,septal,& apical perfusion defect. EF 59%.   PERMANENT PACEMAKER INSERTION N/A 12/26/2012   Procedure: PERMANENT PACEMAKER INSERTION;  Surgeon: Thurmon Fair, MD;  Location: [facility] CATH LAB;  Service:  Cardiovascular;  Laterality: N/A;   TONSILLECTOMY AND ADENOIDECTOMY  1960's   TUBAL LIGATION  1971   VAGINAL HYSTERECTOMY  1980's     Current Meds  Medication Sig   amiodarone (PACERONE) 200 MG tablet Take 1 tablet (200 mg total) by mouth daily. KEEP UPCOMING OFFICE VISIT FOR FUTURE REFILLS.   [DISCONTINUED] diltiazem (CARDIZEM CD) 120 MG 24 hr capsule Take 1 capsule (120 mg total) by mouth daily. KEEP UPCOMING OFFICE VISIT FOR FUTURE REFILLS.   [DISCONTINUED] ELIQUIS 5 MG TABS tablet TAKE 1 TABLET TWICE A DAY   [DISCONTINUED] furosemide (LASIX) 20 MG tablet Take one tablet daily. May take an extra tablet daily (40 mg) for a weight increase of 5 pounds in one week.   [DISCONTINUED] irbesartan (AVAPRO) 150 MG tablet TAKE 1 TABLET (150 MG TOTAL) BY MOUTH DAILY. KEEP SCHEDULED APPPOINTMENT   [DISCONTINUED] LIVALO 1 MG TABS TAKE 1 TABLET DAILY   [DISCONTINUED] potassium chloride (KLOR-CON) 10 MEQ tablet Take 1 tablet (10 mEq total) by mouth daily. KEEP SCHEDULED APPOINTMENT FOR FUTURE REFILLS     Allergies:   Azithromycin and Codeine   Social History   Tobacco Use   Smoking status: Never   Smokeless tobacco: Never  Vaping Use   Vaping Use: Never used  Substance Use Topics   Alcohol use: No   Drug use: No     Family Hx: The patient's family history includes Heart attack in her mother; Stroke in her sister.  ROS:   Please see the history of present illness.    All other systems are reviewed and are negative.  Prior CV studies:   The following studies were reviewed today:  Comprehensive pacemaker check today.    Echocardiogram November 06, 2018   1. Left ventricular ejection fraction, by visual estimation, is 50 to 55%. The left ventricle has normal function. There is mildly increased left ventricular hypertrophy.  2. Left ventricular diastolic parameters are consistent with Grade I diastolic dysfunction (impaired relaxation).  3. Global right ventricle has normal systolic  function.The right ventricular size is normal. No increase in right ventricular wall thickness.  4. Left atrial size was normal.  5. Right atrial size was normal.  6. The mitral valve is normal in structure. Trace mitral valve regurgitation.  7. The tricuspid valve is grossly normal. Tricuspid valve regurgitation is mild.  8. Aortic valve regurgitation is mild to moderate.  9. The aortic valve is normal in structure. Aortic valve regurgitation is mild to moderate. No evidence of aortic valve sclerosis or stenosis. 10. The pulmonic valve was grossly normal. Pulmonic valve regurgitation is trivial. 11. Aortic dilatation noted. 12. There is mild to moderate dilatation of the ascending aorta measuring 42 mm. 13. Normal pulmonary artery systolic pressure. 14. A pacer wire is visualized. 15. The atrial septum is grossly normal.  Labs/Other Tests and Data Reviewed:    EKG:  ECG performed today shows atrial paced, ventricular sensed rhythm with long AV delay (210 ms), LBBB (QRS 154 ms), QTc 490 ms Recent Labs: 12/12/2020: BNP 41.7; BUN 23; Creatinine, Ser 1.88; Potassium 5.0; Sodium 141   Recent Lipid Panel Lab Results  Component Value Date/Time  CHOL 152 06/22/2019 09:01 AM   TRIG 88 06/22/2019 09:01 AM   HDL 56 06/22/2019 09:01 AM   CHOLHDL 2.7 06/22/2019 09:01 AM   CHOLHDL 2.5 09/01/2015 09:54 AM   LDLCALC 79 06/22/2019 09:01 AM    Wt Readings from Last 3 Encounters:  07/31/21 156 lb (70.8 kg)  01/18/20 150 lb 9.6 oz (68.3 kg)  11/16/19 147 lb 9.6 oz (67 kg)     Objective:    Vital Signs:  BP 106/74 (BP Location: Left Arm, Patient Position: Sitting, Cuff Size: Normal)   Pulse 66   Ht 5\' 2"  (1.575 m)   Wt 156 lb (70.8 kg)   SpO2 97%   BMI 28.53 kg/m      General: Alert, oriented x3, no distress, healthy left subclavian pacemaker site Head: no evidence of trauma, PERRL, EOMI, no exophtalmos or lid lag, no myxedema, no xanthelasma; normal ears, nose and oropharynx Neck:  normal jugular venous pulsations and no hepatojugular reflux; brisk carotid pulses without delay and no carotid bruits Chest: clear to auscultation, no signs of consolidation by percussion or palpation, normal fremitus, symmetrical and full respiratory excursions Cardiovascular: normal position and quality of the apical impulse, regular rhythm, normal first and paradoxically split second heart sounds, no murmurs, rubs or gallops Abdomen: no tenderness or distention, no masses by palpation, no abnormal pulsatility or arterial bruits, normal bowel sounds, no hepatosplenomegaly Extremities: no clubbing, cyanosis or edema; 2+ radial, ulnar and brachial pulses bilaterally; 2+ right femoral, posterior tibial and dorsalis pedis pulses; 2+ left femoral, posterior tibial and dorsalis pedis pulses; no subclavian or femoral bruits Neurological: grossly nonfocal Psych: Normal mood and affect     ASSESSMENT & PLAN:    1. Chronic diastolic heart failure (HCC)   2. PAF (paroxysmal atrial fibrillation) (HCC)   3. Acquired thrombophilia (HCC)   4. Pacemaker- MDT implanted 12/26/12   5. SSS (sick sinus syndrome) (HCC)   6. Encounter for monitoring amiodarone therapy   7. Pure hypercholesterolemia       CHF: NYHA functional class I, clinically euvolemic.  Whenever she has persistent atrial fibrillation she often deteriorates mostly due to tachycardia related cardiomyopathy and due to increased right ventricular pacing.  Has not required any diuretic dose adjustment since her cardioversion almost 2 years ago. AFib: No recurrence on amiodarone in almost 2 years.  Poorly tolerated when persistent.   CHADSVasc 5-6 (age 26, gender, HTN, CHF, possible CAD).   Anticoagulation: Compliant with anticoagulant, no bleeding problems. PM: Normal device function.  Virtually 100% atrial paced, ventricular sensed rhythm. SSS: She does not have any meaningful underlying atrial rhythm other than occasional PACs.  She is  "atrially pacemaker dependent".  With current sensor settings the heart rate histogram distribution appears appropriate. Hypertension: Very well controlled.  Does not tolerate higher diuretic doses.   Amiodarone: Time to reassess liver and thyroid function tests..  Discussed the potential for numerous side effects including thyroid disorder, hepatotoxicity, lung fibrosis, skin photosensitivity, ocular complications, multiple drug interactions, etc. discussed referring for EP evaluation and ablation, but she is now 81 years old and prefers less invasive approach. HLP: Recheck lipid profile.  She has not tolerated potent statins or higher doses of pitavastatin.  Since she does not have significant CAD or PAD, and LDL cholesterol less than 70 is considered satisfactory.      Medication Adjustments/Labs and Tests Ordered: Current medicines are reviewed at length with the patient today.  Concerns regarding medicines are outlined above.   Tests  Ordered: Orders Placed This Encounter  Procedures   Comprehensive metabolic panel   Lipid panel   TSH   EKG 12-Lead    Medication Changes: Meds ordered this encounter  Medications   diltiazem (CARDIZEM CD) 120 MG 24 hr capsule    Sig: Take 1 capsule (120 mg total) by mouth daily.    Dispense:  90 capsule    Refill:  3   apixaban (ELIQUIS) 5 MG TABS tablet    Sig: Take 1 tablet (5 mg total) by mouth 2 (two) times daily.    Dispense:  180 tablet    Refill:  3   furosemide (LASIX) 20 MG tablet    Sig: Take one tablet daily. May take an extra tablet daily (40 mg) for a weight increase of 5 pounds in one week.    Dispense:  90 tablet    Refill:  3   potassium chloride (KLOR-CON) 10 MEQ tablet    Sig: Take 1 tablet (10 mEq total) by mouth daily.    Dispense:  90 tablet    Refill:  3   Pitavastatin Calcium (LIVALO) 1 MG TABS    Sig: Take 1 tablet (1 mg total) by mouth daily.    Dispense:  90 tablet    Refill:  3   irbesartan (AVAPRO) 150 MG  tablet    Sig: Take 1 tablet (150 mg total) by mouth daily.    Dispense:  90 tablet    Refill:  3   Patient Instructions  Medication Instructions:  No changes *If you need a refill on your cardiac medications before your next appointment, please call your pharmacy*   Lab Work: Your provider would like for you to have the following labs today: TSH, CMET, and Lipid  If you have labs (blood work) drawn today and your tests are completely normal, you will receive your results only by: MyChart Message (if you have MyChart) OR A paper copy in the mail If you have any lab test that is abnormal or we need to change your treatment, we will call you to review the results.   Testing/Procedures: None ordered   Follow-Up: At [facility], you and your health needs are our priority.  As part of our continuing mission to provide you with exceptional heart care, we have created designated Provider Care Teams.  These Care Teams include your primary Cardiologist (physician) and Advanced Practice Providers (APPs -  Physician Assistants and Nurse Practitioners) who all work together to provide you with the care you need, when you need it.  We recommend signing up for the patient portal called "MyChart".  Sign up information is provided on this After Visit Summary.  MyChart is used to connect with patients for Virtual Visits (Telemedicine).  Patients are able to view lab/test results, encounter notes, upcoming appointments, etc.  Non-urgent messages can be sent to your provider as well.   To learn more about what you can do with MyChart, go to [email or web address].    Your next appointment:   12 month(s)  The format for your next appointment:   In Person  Provider:   Thurmon Fair, MD {   Important Information About Sugar         Disposition:  Follow up  3 months  Signed, Thurmon Fair, MD  07/31/2021 4:18 PM    [facility]

## 2021-08-01 LAB — COMPREHENSIVE METABOLIC PANEL
ALT: 24 IU/L (ref 0–32)
AST: 24 IU/L (ref 0–40)
Albumin/Globulin Ratio: 2.1 (ref 1.2–2.2)
Albumin: 4.6 g/dL (ref 3.8–4.8)
Alkaline Phosphatase: 187 IU/L — ABNORMAL HIGH (ref 44–121)
BUN/Creatinine Ratio: 15 (ref 12–28)
BUN: 24 mg/dL (ref 8–27)
Bilirubin Total: 0.3 mg/dL (ref 0.0–1.2)
CO2: 22 mmol/L (ref 20–29)
Calcium: 9.9 mg/dL (ref 8.7–10.3)
Chloride: 104 mmol/L (ref 96–106)
Creatinine, Ser: 1.63 mg/dL — ABNORMAL HIGH (ref 0.57–1.00)
Globulin, Total: 2.2 g/dL (ref 1.5–4.5)
Glucose: 86 mg/dL (ref 70–99)
Potassium: 4.8 mmol/L (ref 3.5–5.2)
Sodium: 142 mmol/L (ref 134–144)
Total Protein: 6.8 g/dL (ref 6.0–8.5)
eGFR: 32 mL/min/{1.73_m2} — ABNORMAL LOW (ref >59)

## 2021-08-01 LAB — LIPID PANEL
Chol/HDL Ratio: 2.8 ratio (ref 0.0–4.4)
Cholesterol, Total: 182 mg/dL (ref 100–199)
HDL: 65 mg/dL (ref >39)
LDL Chol Calc (NIH): 96 mg/dL (ref 0–99)
Triglycerides: 119 mg/dL (ref 0–149)
VLDL Cholesterol Cal: 21 mg/dL (ref 5–40)

## 2021-08-01 LAB — TSH: TSH: 2.34 u[IU]/mL (ref 0.450–4.500)

## 2021-08-02 ENCOUNTER — Telehealth: Payer: Self-pay | Admitting: Cardiovascular Disease

## 2021-08-02 NOTE — Telephone Encounter (Signed)
Returned call, Spoke with Bijal. We went over the directions, she verbalized understanding. No further questions at this time.

## 2021-08-02 NOTE — Telephone Encounter (Signed)
Pt c/o medication issue:  1. Name of Medication:   furosemide (LASIX) 20 MG tablet  2. How are you currently taking this medication (dosage and times per day)?   Unknown  3. Are you having a reaction (difficulty breathing--STAT)?  Unknown  4. What is your medication issue?   Caller stated they need clarification on the directions for this medication.  Reference# [id number].

## 2021-08-03 ENCOUNTER — Encounter: Payer: Self-pay | Admitting: *Deleted

## 2021-09-15 ENCOUNTER — Other Ambulatory Visit (HOSPITAL_BASED_OUTPATIENT_CLINIC_OR_DEPARTMENT_OTHER): Payer: Self-pay

## 2021-09-15 MED ORDER — AREXVY 120 MCG/0.5ML IM SUSR
INTRAMUSCULAR | 0 refills | Status: DC
  Filled 2021-09-15: qty 0.5, 1d supply, fill #0

## 2021-09-20 ENCOUNTER — Other Ambulatory Visit (HOSPITAL_BASED_OUTPATIENT_CLINIC_OR_DEPARTMENT_OTHER): Payer: Self-pay

## 2021-09-20 MED ORDER — INFLUENZA VAC A&B SA ADJ QUAD 0.5 ML IM PRSY
PREFILLED_SYRINGE | INTRAMUSCULAR | 0 refills | Status: DC
Start: 2021-09-20 — End: 2023-03-18
  Filled 2021-09-20: qty 0.5, 1d supply, fill #0

## 2021-09-21 ENCOUNTER — Other Ambulatory Visit (HOSPITAL_BASED_OUTPATIENT_CLINIC_OR_DEPARTMENT_OTHER): Payer: Self-pay

## 2021-09-27 ENCOUNTER — Telehealth: Payer: Self-pay | Admitting: Cardiovascular Disease

## 2021-09-27 NOTE — Telephone Encounter (Signed)
Patient called with advise from MD. Med list updated. Advised to monitor heart rate/rhythm as last note mentions well controlled on amiodarone.

## 2021-09-27 NOTE — Telephone Encounter (Signed)
Pt c/o medication issue:  1. Name of Medication: amiodarone (PACERONE) 200 MG tablet  2. How are you currently taking this medication (dosage and times per day)? Take 1 tablet (200 mg total) by mouth daily. KEEP UPCOMING OFFICE VISIT FOR FUTURE REFILLS.  3. Are you having a reaction (difficulty breathing--STAT)? no  4. What is your medication issue? Patient stating that this medication is causing her tingle in her leg and feet. Feel like there is a burn from her knees to her leg. Please advise

## 2021-09-27 NOTE — Telephone Encounter (Signed)
Spoke with patient of Dr. Sallyanne Kuster who c/o burning and tingling in her legs since her last visit with Dr. Loletha Grayer. She reports feeling short-winded also. She thought was amiodarone but then said she thinks she could be taking too much med. She cannot sleep at night well. She feels like her heart is still in rhythm. Advised could be neuropathic pain - she does not have a current PCP to see for this.  Advised will send message to Dr. Sallyanne Kuster to review

## 2021-09-27 NOTE — Telephone Encounter (Signed)
Amiodarone may be the cause of those neuropathic symptoms.  Please reduce to amiodarone 200 mg every other day.

## 2021-10-05 ENCOUNTER — Ambulatory Visit (INDEPENDENT_AMBULATORY_CARE_PROVIDER_SITE_OTHER): Payer: Medicare Other

## 2021-10-05 DIAGNOSIS — I495 Sick sinus syndrome: Secondary | ICD-10-CM

## 2021-10-06 LAB — CUP PACEART REMOTE DEVICE CHECK
Battery Remaining Longevity: 17 mo
Battery Voltage: 2.9 V
Brady Statistic AP VP Percent: 0.14 %
Brady Statistic AP VS Percent: 99.4 %
Brady Statistic AS VP Percent: 0 %
Brady Statistic AS VS Percent: 0.47 %
Brady Statistic RA Percent Paced: 99.53 %
Brady Statistic RV Percent Paced: 0.14 %
Date Time Interrogation Session: 20230928154021
Implantable Lead Implant Date: 20141219
Implantable Lead Implant Date: 20141219
Implantable Lead Location: 753859
Implantable Lead Location: 753860
Implantable Lead Model: 5076
Implantable Lead Model: 5076
Implantable Pulse Generator Implant Date: 20141219
Lead Channel Impedance Value: 361 Ohm
Lead Channel Impedance Value: 361 Ohm
Lead Channel Impedance Value: 418 Ohm
Lead Channel Impedance Value: 418 Ohm
Lead Channel Pacing Threshold Amplitude: 0.625 V
Lead Channel Pacing Threshold Amplitude: 1.25 V
Lead Channel Pacing Threshold Pulse Width: 0.4 ms
Lead Channel Pacing Threshold Pulse Width: 0.4 ms
Lead Channel Sensing Intrinsic Amplitude: 3.875 mV
Lead Channel Sensing Intrinsic Amplitude: 3.875 mV
Lead Channel Sensing Intrinsic Amplitude: 5.25 mV
Lead Channel Sensing Intrinsic Amplitude: 5.25 mV
Lead Channel Setting Pacing Amplitude: 2 V
Lead Channel Setting Pacing Amplitude: 2.75 V
Lead Channel Setting Pacing Pulse Width: 0.4 ms
Lead Channel Setting Sensing Sensitivity: 0.9 mV

## 2021-10-09 ENCOUNTER — Other Ambulatory Visit: Payer: Self-pay | Admitting: Cardiovascular Disease

## 2021-10-11 NOTE — Progress Notes (Signed)
Remote pacemaker transmission.   

## 2021-10-21 ENCOUNTER — Other Ambulatory Visit: Payer: Self-pay | Admitting: Cardiovascular Disease

## 2021-10-24 ENCOUNTER — Telehealth: Payer: Self-pay | Admitting: Cardiovascular Disease

## 2021-10-24 MED ORDER — GABAPENTIN 100 MG PO CAPS: 100.0000 mg | ORAL_CAPSULE | Freq: Two times a day (BID) | ORAL | 3 refills | Status: DC

## 2021-10-24 NOTE — Telephone Encounter (Signed)
Sounds like neuropathy. Recommend gabapentin 100 mg twice daily. Watch out for dizziness. OK to Rx a 30 day supply w 3 RF. We can titrate the dose up and when we settle on the right amount, can switch to 90 days Rx.

## 2021-10-24 NOTE — Telephone Encounter (Signed)
Pt is calling in regards to legs, feet and knees burning and stinging. She states that she needs relief so she can get a rest since it has been keeping her up at night. She said the decrease in medication from the last time she reached out on 09/20 has not helped and she would like a call back to discuss options.

## 2021-10-24 NOTE — Telephone Encounter (Signed)
Patient informed of reply from Dr. Sallyanne Kuster: "Recommend gabapentin 100 mg twice daily. Watch out for dizziness." Explained medication and precautions of dizziness to patient. She will contact clinic if she cannot tolerate the drug. She verbalized understanding. Prescription sent to preferred pharmacy.

## 2021-10-24 NOTE — Telephone Encounter (Signed)
Patient stated her legs and feet are burning and stinging. She stated she is not taking amiodarone 200mg  every other day as prescribed. She is taking 100mg  daily. She is unable to sleep at night due to the burning and stinging, has a hard time walking during the day and gets sob. She stated her heart is regular and she does not having any more dizziness. She is asking for something to take for the sensation she has in her legs. She does not have a PCP.

## 2021-10-30 ENCOUNTER — Other Ambulatory Visit (HOSPITAL_BASED_OUTPATIENT_CLINIC_OR_DEPARTMENT_OTHER): Payer: Self-pay

## 2021-10-30 MED ORDER — TETANUS-DIPHTH-ACELL PERTUSSIS 5-2.5-18.5 LF-MCG/0.5 IM SUSY
PREFILLED_SYRINGE | INTRAMUSCULAR | 0 refills | Status: DC
  Filled 2021-10-30: qty 0.5, 1d supply, fill #0

## 2021-12-21 ENCOUNTER — Other Ambulatory Visit (HOSPITAL_BASED_OUTPATIENT_CLINIC_OR_DEPARTMENT_OTHER): Payer: Self-pay

## 2021-12-21 MED ORDER — COMIRNATY 30 MCG/0.3ML IM SUSY
PREFILLED_SYRINGE | INTRAMUSCULAR | 0 refills | Status: DC
  Filled 2021-12-21: qty 0.3, 1d supply, fill #0

## 2022-01-04 ENCOUNTER — Ambulatory Visit (INDEPENDENT_AMBULATORY_CARE_PROVIDER_SITE_OTHER): Payer: Medicare Other

## 2022-01-04 DIAGNOSIS — I495 Sick sinus syndrome: Secondary | ICD-10-CM

## 2022-01-05 LAB — CUP PACEART REMOTE DEVICE CHECK
Battery Remaining Longevity: 12 mo
Battery Voltage: 2.89 V
Brady Statistic AP VP Percent: 0.07 %
Brady Statistic AP VS Percent: 99.46 %
Brady Statistic AS VP Percent: 0 %
Brady Statistic AS VS Percent: 0.47 %
Brady Statistic RA Percent Paced: 99.53 %
Brady Statistic RV Percent Paced: 0.07 %
Date Time Interrogation Session: 20231229131833
Implantable Lead Connection Status: 753985
Implantable Lead Connection Status: 753985
Implantable Lead Implant Date: 20141219
Implantable Lead Implant Date: 20141219
Implantable Lead Location: 753859
Implantable Lead Location: 753860
Implantable Lead Model: 5076
Implantable Lead Model: 5076
Implantable Pulse Generator Implant Date: 20141219
Lead Channel Impedance Value: 361 Ohm
Lead Channel Impedance Value: 380 Ohm
Lead Channel Impedance Value: 418 Ohm
Lead Channel Impedance Value: 418 Ohm
Lead Channel Pacing Threshold Amplitude: 0.625 V
Lead Channel Pacing Threshold Amplitude: 1.25 V
Lead Channel Pacing Threshold Pulse Width: 0.4 ms
Lead Channel Pacing Threshold Pulse Width: 0.4 ms
Lead Channel Sensing Intrinsic Amplitude: 3.75 mV
Lead Channel Sensing Intrinsic Amplitude: 3.75 mV
Lead Channel Sensing Intrinsic Amplitude: 5.375 mV
Lead Channel Sensing Intrinsic Amplitude: 5.375 mV
Lead Channel Setting Pacing Amplitude: 2 V
Lead Channel Setting Pacing Amplitude: 2.5 V
Lead Channel Setting Pacing Pulse Width: 0.4 ms
Lead Channel Setting Sensing Sensitivity: 0.9 mV
Zone Setting Status: 755011
Zone Setting Status: 755011

## 2022-01-22 NOTE — Progress Notes (Signed)
Remote pacemaker transmission.   

## 2022-02-14 ENCOUNTER — Other Ambulatory Visit: Payer: Self-pay | Admitting: Cardiovascular Disease

## 2022-02-15 ENCOUNTER — Encounter (HOSPITAL_COMMUNITY): Payer: Self-pay | Admitting: *Deleted

## 2022-02-27 ENCOUNTER — Other Ambulatory Visit: Payer: Self-pay

## 2022-02-27 DIAGNOSIS — I4819 Other persistent atrial fibrillation: Secondary | ICD-10-CM

## 2022-02-27 MED ORDER — APIXABAN 2.5 MG PO TABS: 2.5000 mg | ORAL_TABLET | Freq: Two times a day (BID) | ORAL | 1 refills | Status: DC

## 2022-02-27 NOTE — Telephone Encounter (Addendum)
Eliquis 3m refill request received. Patient is 82years old, weight-70.8kg, Crea-1.63 on 07/31/21, Diagnosis-Afib, and last seen by Dr. CSallyanne Kusteron 07/31/21.  Will inquire about doseage with PharmD   Discussed with KErasmo Downer PharmD and she states to reduce eliquis dose to 2.562mBID. Called pt in reference to this and she will use the rest of her eliquis 29m89mabs and take 1/2 tablet which equals 2.29mg37mice a day until she receives the eliquis 2.29mg 41mlets.   She [facility]

## 2022-03-14 ENCOUNTER — Other Ambulatory Visit: Payer: Self-pay | Admitting: Cardiovascular Disease

## 2022-03-24 DIAGNOSIS — M79641 Pain in right hand: Secondary | ICD-10-CM | POA: Insufficient documentation

## 2022-04-06 ENCOUNTER — Ambulatory Visit (INDEPENDENT_AMBULATORY_CARE_PROVIDER_SITE_OTHER): Payer: Medicare Other

## 2022-04-06 DIAGNOSIS — I495 Sick sinus syndrome: Secondary | ICD-10-CM

## 2022-04-10 LAB — CUP PACEART REMOTE DEVICE CHECK
Battery Remaining Longevity: 10 mo
Battery Voltage: 2.88 V
Brady Statistic AP VP Percent: 0.07 %
Brady Statistic AP VS Percent: 99.38 %
Brady Statistic AS VP Percent: 0 %
Brady Statistic AS VS Percent: 0.55 %
Brady Statistic RA Percent Paced: 99.45 %
Brady Statistic RV Percent Paced: 0.07 %
Date Time Interrogation Session: 20240402114104
Implantable Lead Connection Status: 753985
Implantable Lead Connection Status: 753985
Implantable Lead Implant Date: 20141219
Implantable Lead Implant Date: 20141219
Implantable Lead Location: 753859
Implantable Lead Location: 753860
Implantable Lead Model: 5076
Implantable Lead Model: 5076
Implantable Pulse Generator Implant Date: 20141219
Lead Channel Impedance Value: 361 Ohm
Lead Channel Impedance Value: 380 Ohm
Lead Channel Impedance Value: 418 Ohm
Lead Channel Impedance Value: 437 Ohm
Lead Channel Pacing Threshold Amplitude: 0.625 V
Lead Channel Pacing Threshold Amplitude: 1.25 V
Lead Channel Pacing Threshold Pulse Width: 0.4 ms
Lead Channel Pacing Threshold Pulse Width: 0.4 ms
Lead Channel Sensing Intrinsic Amplitude: 3.25 mV
Lead Channel Sensing Intrinsic Amplitude: 3.25 mV
Lead Channel Sensing Intrinsic Amplitude: 5.5 mV
Lead Channel Sensing Intrinsic Amplitude: 5.5 mV
Lead Channel Setting Pacing Amplitude: 2 V
Lead Channel Setting Pacing Amplitude: 2.5 V
Lead Channel Setting Pacing Pulse Width: 0.4 ms
Lead Channel Setting Sensing Sensitivity: 0.9 mV
Zone Setting Status: 755011
Zone Setting Status: 755011

## 2022-04-28 ENCOUNTER — Other Ambulatory Visit: Payer: Self-pay | Admitting: Cardiovascular Disease

## 2022-05-04 ENCOUNTER — Telehealth: Payer: Self-pay | Admitting: Cardiovascular Disease

## 2022-05-04 MED ORDER — GABAPENTIN 100 MG PO CAPS: 100.0000 mg | ORAL_CAPSULE | Freq: Two times a day (BID) | ORAL | 0 refills | Status: DC

## 2022-05-04 NOTE — Telephone Encounter (Signed)
*  STAT* If patient is at the pharmacy, call can be transferred to refill team.   1. Which medications need to be refilled? (please list name of each medication and dose if known) gabapentin (NEURONTIN) 100 MG capsule   2. Which pharmacy/location (including street and city if local pharmacy) is medication to be sent to? Take 1 capsule (100 mg total) by mouth 2 (two) times daily.   3. Do they need a 30 day or 90 day supply? 90

## 2022-05-04 NOTE — Telephone Encounter (Signed)
Refills has been sent to the pharmacy. 

## 2022-05-09 NOTE — Progress Notes (Signed)
Remote pacemaker transmission.   

## 2022-06-12 ENCOUNTER — Other Ambulatory Visit: Payer: Self-pay | Admitting: Cardiovascular Disease

## 2022-06-26 ENCOUNTER — Ambulatory Visit
Admission: EM | Admit: 2022-06-26 | Discharge: 2022-06-26 | Disposition: A | Discharge status: Home / Self Care | Payer: Medicare Other | Attending: Nurse Practitioner | Admitting: Nurse Practitioner

## 2022-06-26 DIAGNOSIS — J019 Acute sinusitis, unspecified: Secondary | ICD-10-CM | POA: Diagnosis not present

## 2022-06-26 DIAGNOSIS — B9689 Other specified bacterial agents as the cause of diseases classified elsewhere: Secondary | ICD-10-CM

## 2022-06-26 MED ORDER — BENZONATATE 100 MG PO CAPS: 100.0000 mg | ORAL_CAPSULE | Freq: Three times a day (TID) | ORAL | 0 refills | Status: DC | PRN

## 2022-06-26 MED ORDER — AMOXICILLIN-POT CLAVULANATE 875-125 MG PO TABS: 1.0000 | ORAL_TABLET | Freq: Two times a day (BID) | ORAL | 0 refills | Status: AC

## 2022-06-26 NOTE — ED Provider Notes (Signed)
[facility]    CSN: [id number] Arrival date & time: 06/26/22  1603      History   Chief Complaint No chief complaint on file.   HPI Monica Harvey is a 82 y.o. female.   Patient presents today for 2-3 week history of congested cough with thick, lime colored mucus, shortness of breath with coughing, "rattling in my chest", chest tightness and chest congestion, postnasal drainage and sore throat, sinus pressure in her cheeks, headache, left ear pain without drainage and without decreasing, decreased appetite, and fatigue.  She denies current fever, body aches or chills.  Think she had a fever at the beginning, however symptoms have not improved.  No dry cough, chest pain, runny or stuffy nose, abdominal pain, nausea/vomiting, or diarrhea.  Has taken Xyzal, Tessalon Perles, and Tylenol for symptoms which helped temporarily.  Patient denies smoking history denies antibiotic use in the past 3 months.    Past Medical History:  Diagnosis Date   Allergy    Anginal pain (HCC)    Chronic anticoagulation, CHAD2Vasc score 4 02/09/2014   Chronic diastolic CHF (congestive heart failure) (HCC)    a. 01/2014 Echo: EF 50-55%, Gr1 DD, mild AI/MR.   Chronic kidney disease    First degree atrioventricular block    GERD (gastroesophageal reflux disease)    Heart murmur    HTN (hypertension)    Hyperlipidemia    LBBB (left bundle branch block)    Osteoporosis    Osteopenia   PAF (paroxysmal atrial fibrillation) (HCC)    a. Previously on flecainide;  b. 01/2014 sotalol loaded and dccv performed;  c. Chronic Savaysa Rx.   Presence of permanent cardiac pacemaker    a. 12/26/12 s/p MDT DC PPM.   SSS (sick sinus syndrome) (HCC)    a. s/p PPM-Medtronic placed 12/26/12    Symptomatic sinus bradycardia     Patient Active Problem List   Diagnosis Date Noted   Persistent atrial fibrillation (HCC)    Sinusitis 10/31/2017   SSS (sick sinus syndrome) (HCC) 09/03/2015   Long term current  use of anticoagulant 02/09/2014   Chronic combined systolic and diastolic CHF, NYHA class 1 (HCC) 02/09/2014   Tachy-brady syndrome (HCC) 01/25/2014   Chronic renal insufficiency, stage III (moderate) (HCC) 01/24/2014   Acute on chronic diastolic CHF (congestive heart failure) (HCC) 01/21/2014   Pacemaker- MDT implanted 12/26/12 12/26/2012   LBBB (left bundle branch block) 12/23/2012   First degree atrioventricular block 12/23/2012   Hyperlipidemia 12/23/2012   HTN (hypertension) 12/23/2012    Past Surgical History:  Procedure Laterality Date   APPENDECTOMY  1971   BREAST EXCISIONAL BIOPSY Right    CARDIOVERSION N/A 01/22/2014   Procedure: CARDIOVERSION;  Surgeon: Chrystie Nose, MD;  Location: [facility] ENDOSCOPY;  Service: Cardiovascular;  Laterality: N/A;   CARDIOVERSION N/A 04/10/2017   Procedure: CARDIOVERSION;  Surgeon: Jake Bathe, MD;  Location: [facility] ENDOSCOPY;  Service: Cardiovascular;  Laterality: N/A;   CARDIOVERSION N/A 10/23/2019   Procedure: CARDIOVERSION;  Surgeon: Thurmon Fair, MD;  Location: [facility] ENDOSCOPY;  Service: Cardiovascular;  Laterality: N/A;   INSERT / REPLACE / REMOVE PACEMAKER  12/26/2012   Medtronic, Dr. Royann Shivers   NM MYOCAR PERF WALL MOTION  08/05/2009   small to mod. reversible anteroseptal,septal,& apical perfusion defect. EF 59%.   PERMANENT PACEMAKER INSERTION N/A 12/26/2012   Procedure: PERMANENT PACEMAKER INSERTION;  Surgeon: Thurmon Fair, MD;  Location: [facility] CATH LAB;  Service: Cardiovascular;  Laterality: N/A;   TONSILLECTOMY AND ADENOIDECTOMY  1960's   TUBAL LIGATION  1971   VAGINAL HYSTERECTOMY  1980's    OB History   No obstetric history on file.      Home Medications    Prior to Admission medications   Medication Sig Start Date End Date Taking? Authorizing Provider  amoxicillin-clavulanate (AUGMENTIN) 875-125 MG tablet Take 1 tablet by mouth 2 (two) times daily for 7 days. 06/26/22 07/03/22 Yes Valentino Nose, NP  benzonatate  (TESSALON) 100 MG capsule Take 1 capsule (100 mg total) by mouth 3 (three) times daily as needed for cough. Do not take with alcohol or while driving or operating heavy machinery.  May cause drowsiness. 06/26/22  Yes Valentino Nose, NP  acetaminophen (TYLENOL) 500 MG tablet Take 500 mg by mouth daily as needed for moderate pain or headache. Patient not taking: Reported on 07/31/2021    [provider]  albuterol (VENTOLIN HFA) 108 (90 Base) MCG/ACT inhaler Inhale 2 puffs into the lungs every 6 (six) hours as needed for wheezing or shortness of breath. Patient not taking: Reported on 07/31/2021    [provider]  amiodarone (PACERONE) 200 MG tablet Take 1 tablet (200 mg total) by mouth daily. 10/10/21   Croitoru, Mihai, MD  apixaban (ELIQUIS) 2.5 MG TABS tablet Take 1 tablet (2.5 mg total) by mouth 2 (two) times daily. 02/27/22   Croitoru, Mihai, MD  COVID-19 mRNA bivalent vaccine, Pfizer, (PFIZER COVID-19 VAC BIVALENT) injection Inject into the muscle. Patient not taking: Reported on 07/31/2021 07/28/21     COVID-19 mRNA Vac-TriS, Pfizer, (PFIZER-BIONT COVID-19 VAC-TRIS) SUSP injection Inject into the muscle. Patient not taking: Reported on 07/31/2021 05/03/20   Judyann Munson, MD  COVID-19 mRNA vaccine [phone] (COMIRNATY) syringe Inject into the muscle. 12/21/21   Judyann Munson, MD  diltiazem (CARDIZEM CD) 120 MG 24 hr capsule TAKE 1 CAPSULE DAILY 06/13/22   Croitoru, Rachelle Hora, MD  fluticasone (FLONASE) 50 MCG/ACT nasal spray Place 1 spray into both nostrils daily as needed for allergies or rhinitis. Patient not taking: Reported on 07/31/2021    [provider]  furosemide (LASIX) 20 MG tablet TAKE 1 TABLET BY MOUTH DAILY *MAY TAKE EXTRA FOR WEIGHT GAIN OF 5LB IN 1 WEEK 05/01/22   Croitoru, Mihai, MD  gabapentin (NEURONTIN) 100 MG capsule TAKE 1 CAPSULE TWICE DAILY 06/13/22   Croitoru, Mihai, MD  influenza vaccine adjuvanted (FLUAD) 0.5 ML injection Inject into the muscle.  09/20/21   Judyann Munson, MD  irbesartan (AVAPRO) 150 MG tablet TAKE 1 TABLET DAILY 06/13/22   Croitoru, Rachelle Hora, MD  omeprazole (PRILOSEC) 20 MG capsule Take 20 mg by mouth daily as needed (acid reflux). Patient not taking: Reported on 07/31/2021    [provider]  Pitavastatin Calcium 1 MG TABS TAKE 1 TABLET DAILY 06/13/22   Croitoru, Mihai, MD  potassium chloride (KLOR-CON) 10 MEQ tablet Take 1 tablet (10 mEq total) by mouth daily. 07/31/21   Croitoru, Mihai, MD  RSV vaccine recomb adjuvanted (AREXVY) 120 MCG/0.5ML injection Inject into the muscle. 09/15/21   Judyann Munson, MD  Tdap Leda Min) 5-2.5-18.5 LF-MCG/0.5 injection Inject into the muscle. 10/30/21   Judyann Munson, MD    Family History Family History  Problem Relation Age of Onset   Heart attack Mother    Stroke Sister     Social History Social History   Tobacco Use   Smoking status: Never   Smokeless tobacco: Never  Vaping Use   Vaping Use: Never used  Substance Use Topics   Alcohol use:  No   Drug use: No     Allergies   Azithromycin and Codeine   Review of Systems Review of Systems Per HPI  Physical Exam Triage Vital Signs ED Triage Vitals  Enc Vitals Group     BP 06/26/22 1627 123/79     Pulse Rate 06/26/22 1627 85     Resp 06/26/22 1627 15     Temp 06/26/22 1627 97.9 F (36.6 C)     Temp Source 06/26/22 1627 Oral     SpO2 06/26/22 1627 96 %     Weight --      Height --      Head Circumference --      Peak Flow --      Pain Score 06/26/22 1628 6     Pain Loc --      Pain Edu? --      Excl. in GC? --    No data found.  Updated Vital Signs BP 123/79 (BP Location: Right Arm)   Pulse 85   Temp 97.9 F (36.6 C) (Oral)   Resp 15   SpO2 96%   Visual Acuity Right Eye Distance:   Left Eye Distance:   Bilateral Distance:    Right Eye Near:   Left Eye Near:    Bilateral Near:     Physical Exam Vitals and nursing note reviewed.  Constitutional:      General: She is not in acute  distress.    Appearance: Normal appearance. She is not ill-appearing or toxic-appearing.  HENT:     Head: Normocephalic and atraumatic.     Right Ear: Tympanic membrane, ear canal and external ear normal.     Left Ear: Tympanic membrane, ear canal and external ear normal.     Nose: Congestion and rhinorrhea present.     Right Sinus: Maxillary sinus tenderness present. No frontal sinus tenderness.     Left Sinus: Maxillary sinus tenderness present. No frontal sinus tenderness.     Mouth/Throat:     Mouth: Mucous membranes are moist.     Pharynx: Oropharynx is clear. Posterior oropharyngeal erythema present. No oropharyngeal exudate.  Eyes:     General: No scleral icterus.    Extraocular Movements: Extraocular movements intact.  Cardiovascular:     Rate and Rhythm: Normal rate and regular rhythm.  Pulmonary:     Effort: Pulmonary effort is normal. No respiratory distress.     Breath sounds: Normal breath sounds. No wheezing, rhonchi or rales.  Abdominal:     General: Bowel sounds are normal.  Musculoskeletal:     Cervical back: Normal range of motion and neck supple.  Lymphadenopathy:     Cervical: No cervical adenopathy.  Skin:    General: Skin is warm and dry.     Capillary Refill: Capillary refill takes less than 2 seconds.     Coloration: Skin is not jaundiced or pale.     Findings: No erythema or rash.  Neurological:     Mental Status: She is alert and oriented to person, place, and time.  Psychiatric:        Behavior: Behavior is cooperative.      UC Treatments / Results  Labs (all labs ordered are listed, but only abnormal results are displayed) Labs Reviewed - No data to display  EKG   Radiology No results found.  Procedures Procedures (including critical care time)  Medications Ordered in UC Medications - No data to display  Initial Impression / Assessment and Plan /  UC Course  I have reviewed the triage vital signs and the nursing notes.  Pertinent  labs & imaging results that were available during my care of the patient were reviewed by me and considered in my medical decision making (see chart for details).   Patient is well-appearing, normotensive, afebrile, not tachycardic, not tachypneic, oxygenating well on room air.    1. Acute bacterial sinusitis Treatment Augmentin twice daily for 7 days Supportive care discussed including Mucinex, start Tessalon Perles Encouraged hydration plenty of water Seek care for persistent/worsening symptoms despite treatment  The patient was given the opportunity to ask questions.  All questions answered to their satisfaction.  The patient is in agreement to this plan.    Final Clinical Impressions(s) / UC Diagnoses   Final diagnoses:  Acute bacterial sinusitis     Discharge Instructions      You have a sinus infection.  Take the Augmentin as prescribed to treat it.  Symptoms should improve over the next week to 10 days.  If you develop chest pain or shortness of breath, go to the emergency room.  Some things that can make you feel better are: - Increased rest - Increasing fluid with water/sugar free electrolytes - Acetaminophen and ibuprofen as needed for fever/pain - Salt water gargling, chloraseptic spray and throat lozenges - OTC guaifenesin (Mucinex) 600 mg twice daily for congestion - Saline sinus flushes or a neti pot - Humidifying the air -Tessalon Perles every 8 hours as needed for dry cough     ED Prescriptions     Medication Sig Dispense Auth. Provider   amoxicillin-clavulanate (AUGMENTIN) 875-125 MG tablet Take 1 tablet by mouth 2 (two) times daily for 7 days. 14 tablet Cathlean Marseilles A, NP   benzonatate (TESSALON) 100 MG capsule Take 1 capsule (100 mg total) by mouth 3 (three) times daily as needed for cough. Do not take with alcohol or while driving or operating heavy machinery.  May cause drowsiness. 21 capsule Valentino Nose, NP      PDMP not reviewed this  encounter.   Valentino Nose, NP 06/26/22 1651

## 2022-06-26 NOTE — ED Triage Notes (Signed)
Pt c/o cough and nasal congestion, chest congestion, hoarseness, rattling x 3 weeks

## 2022-06-26 NOTE — Discharge Instructions (Addendum)
You have a sinus infection.  Take the Augmentin as prescribed to treat it.  Symptoms should improve over the next week to 10 days.  If you develop chest pain or shortness of breath, go to the emergency room.  Some things that can make you feel better are: - Increased rest - Increasing fluid with water/sugar free electrolytes - Acetaminophen and ibuprofen as needed for fever/pain - Salt water gargling, chloraseptic spray and throat lozenges - OTC guaifenesin (Mucinex) 600 mg twice daily for congestion - Saline sinus flushes or a neti pot - Humidifying the air -Tessalon Perles every 8 hours as needed for dry cough

## 2022-07-06 ENCOUNTER — Ambulatory Visit (INDEPENDENT_AMBULATORY_CARE_PROVIDER_SITE_OTHER): Payer: Medicare Other

## 2022-07-06 DIAGNOSIS — I495 Sick sinus syndrome: Secondary | ICD-10-CM | POA: Diagnosis not present

## 2022-07-06 LAB — CUP PACEART REMOTE DEVICE CHECK
Battery Remaining Longevity: 7 mo
Battery Voltage: 2.86 V
Brady Statistic AP VP Percent: 0.07 %
Brady Statistic AP VS Percent: 99.43 %
Brady Statistic AS VP Percent: 0 %
Brady Statistic AS VS Percent: 0.5 %
Brady Statistic RA Percent Paced: 99.5 %
Brady Statistic RV Percent Paced: 0.07 %
Date Time Interrogation Session: 20240628090911
Implantable Lead Connection Status: 753985
Implantable Lead Connection Status: 753985
Implantable Lead Implant Date: 20141219
Implantable Lead Implant Date: 20141219
Implantable Lead Location: 753859
Implantable Lead Location: 753860
Implantable Lead Model: 5076
Implantable Lead Model: 5076
Implantable Pulse Generator Implant Date: 20141219
Lead Channel Impedance Value: 361 Ohm
Lead Channel Impedance Value: 361 Ohm
Lead Channel Impedance Value: 418 Ohm
Lead Channel Impedance Value: 437 Ohm
Lead Channel Pacing Threshold Amplitude: 0.625 V
Lead Channel Pacing Threshold Amplitude: 1.25 V
Lead Channel Pacing Threshold Pulse Width: 0.4 ms
Lead Channel Pacing Threshold Pulse Width: 0.4 ms
Lead Channel Sensing Intrinsic Amplitude: 4 mV
Lead Channel Sensing Intrinsic Amplitude: 4 mV
Lead Channel Sensing Intrinsic Amplitude: 5 mV
Lead Channel Sensing Intrinsic Amplitude: 5 mV
Lead Channel Setting Pacing Amplitude: 2 V
Lead Channel Setting Pacing Amplitude: 2.5 V
Lead Channel Setting Pacing Pulse Width: 0.4 ms
Lead Channel Setting Sensing Sensitivity: 0.9 mV
Zone Setting Status: 755011
Zone Setting Status: 755011

## 2022-07-19 NOTE — Progress Notes (Signed)
Remote pacemaker transmission.   

## 2022-07-30 ENCOUNTER — Other Ambulatory Visit (HOSPITAL_BASED_OUTPATIENT_CLINIC_OR_DEPARTMENT_OTHER): Payer: Self-pay

## 2022-08-23 ENCOUNTER — Ambulatory Visit: Payer: Medicare Other | Attending: Cardiovascular Disease | Admitting: Cardiovascular Disease

## 2022-08-23 ENCOUNTER — Encounter: Payer: Self-pay | Admitting: Cardiovascular Disease

## 2022-08-23 VITALS — BP 102/64 | HR 62 | Ht 62.0 in | Wt 157.6 lb

## 2022-08-23 DIAGNOSIS — Z79899 Other long term (current) drug therapy: Secondary | ICD-10-CM | POA: Diagnosis present

## 2022-08-23 DIAGNOSIS — I447 Left bundle-branch block, unspecified: Secondary | ICD-10-CM | POA: Diagnosis present

## 2022-08-23 DIAGNOSIS — I10 Essential (primary) hypertension: Secondary | ICD-10-CM | POA: Diagnosis present

## 2022-08-23 DIAGNOSIS — Z950 Presence of cardiac pacemaker: Secondary | ICD-10-CM

## 2022-08-23 DIAGNOSIS — E7800 Pure hypercholesterolemia, unspecified: Secondary | ICD-10-CM | POA: Diagnosis present

## 2022-08-23 DIAGNOSIS — I480 Paroxysmal atrial fibrillation: Secondary | ICD-10-CM

## 2022-08-23 DIAGNOSIS — D6869 Other thrombophilia: Secondary | ICD-10-CM | POA: Diagnosis present

## 2022-08-23 DIAGNOSIS — I495 Sick sinus syndrome: Secondary | ICD-10-CM

## 2022-08-23 DIAGNOSIS — I5032 Chronic diastolic (congestive) heart failure: Secondary | ICD-10-CM | POA: Diagnosis present

## 2022-08-23 LAB — COMPREHENSIVE METABOLIC PANEL
ALT: 19 IU/L (ref 0–32)
AST: 21 IU/L (ref 0–40)
Albumin: 4.7 g/dL (ref 3.8–4.8)
Alkaline Phosphatase: 203 IU/L — ABNORMAL HIGH (ref 44–121)
BUN/Creatinine Ratio: 15 (ref 12–28)
BUN: 26 mg/dL (ref 8–27)
Bilirubin Total: 0.4 mg/dL (ref 0.0–1.2)
CO2: 26 mmol/L (ref 20–29)
Calcium: 10.5 mg/dL — ABNORMAL HIGH (ref 8.7–10.3)
Chloride: 103 mmol/L (ref 96–106)
Creatinine, Ser: 1.7 mg/dL — ABNORMAL HIGH (ref 0.57–1.00)
Globulin, Total: 2.4 g/dL (ref 1.5–4.5)
Glucose: 98 mg/dL (ref 70–99)
Potassium: 5.2 mmol/L (ref 3.5–5.2)
Sodium: 141 mmol/L (ref 134–144)
Total Protein: 7.1 g/dL (ref 6.0–8.5)
eGFR: 30 mL/min/{1.73_m2} — ABNORMAL LOW (ref >59)

## 2022-08-23 LAB — TSH: TSH: 2.38 u[IU]/mL (ref 0.450–4.500)

## 2022-08-23 LAB — LIPID PANEL
Chol/HDL Ratio: 2.8 ratio (ref 0.0–4.4)
Cholesterol, Total: 195 mg/dL (ref 100–199)
HDL: 70 mg/dL (ref >39)
LDL Chol Calc (NIH): 108 mg/dL — ABNORMAL HIGH (ref 0–99)
Triglycerides: 98 mg/dL (ref 0–149)
VLDL Cholesterol Cal: 17 mg/dL (ref 5–40)

## 2022-08-23 MED ORDER — GABAPENTIN 100 MG PO CAPS: ORAL_CAPSULE | ORAL | 3 refills | Status: DC

## 2022-08-23 MED ORDER — POTASSIUM CHLORIDE ER 10 MEQ PO TBCR: 10.0000 meq | EXTENDED_RELEASE_TABLET | ORAL | 3 refills | Status: DC

## 2022-08-23 MED ORDER — FUROSEMIDE 20 MG PO TABS: 20.0000 mg | ORAL_TABLET | ORAL | 3 refills | Status: DC

## 2022-08-23 NOTE — Patient Instructions (Signed)
Medication Instructions:  Take Lasix 20 mg every other day (may take an extra dose for weight gain of 5 pounds in one week) Take Potassium 10 meq every other day (on the days that you take Lasix) Gabapentin- take 100 mg every morning and INCREASE BEDTIME DOSE TO 200 mg *If you need a refill on your cardiac medications before your next appointment, please call your pharmacy*   Lab Work: CMP, TSH, Lipid If you have labs (blood work) drawn today and your tests are completely normal, you will receive your results only by: MyChart Message (if you have MyChart) OR A paper copy in the mail If you have any lab test that is abnormal or we need to change your treatment, we will call you to review the results.  Follow-Up: At [facility], you and your health needs are our priority.  As part of our continuing mission to provide you with exceptional heart care, we have created designated Provider Care Teams.  These Care Teams include your primary Cardiologist (physician) and Advanced Practice Providers (APPs -  Physician Assistants and Nurse Practitioners) who all work together to provide you with the care you need, when you need it.  We recommend signing up for the patient portal called "MyChart".  Sign up information is provided on this After Visit Summary.  MyChart is used to connect with patients for Virtual Visits (Telemedicine).  Patients are able to view lab/test results, encounter notes, upcoming appointments, etc.  Non-urgent messages can be sent to your provider as well.   To learn more about what you can do with MyChart, go to [email or web address].    Your next appointment:   1 year(s)  Provider:   Thurmon Fair, MD

## 2022-08-23 NOTE — Progress Notes (Signed)
Cardiology office note    Date:  08/23/2022   ID:  Monica Harvey, DOB [birth year], MRN [id number]  PCP:  Pcp, No  Cardiologist:  Thurmon Fair, MD  Electrophysiologist:  None   Evaluation Performed:  Follow-Up Visit  Chief Complaint: Pacemaker check/atrial fibrillation  History of Present Illness:    Monica Harvey is a 82 y.o. female with longstanding tachycardia-bradycardia syndrome, dual-chamber permanent pacemaker (Medtronic), recurrent episodes of persistent atrial fibrillation with previous history of heart failure decompensation due to tachycardia related cardiomyopathy, improved following rhythm correction, chronic left bundle branch block.  She had reasonably good AFib control sotalol for several years after which she had multiple breakthrough events and was transitioned to amiodarone about 3 years ago.  She has not had any significant recurrent atrial fibrillation since cardioversion October 23, 2019.   She is doing well from a cardiovascular point of view. Over the last 12 months., she has not had any palpitations lower extremity edema, orthopnea, PND or significant exertional dyspnea.  She gets a little dizzy if she bends over and stands up quickly.  She continues to have some problems with neuropathy in her lower extremities, although the symptoms are better after starting gabapentin.  He has not had any falls or bleeding problems on Eliquis.  Pacemaker interrogation shows normal device function. The Medtronic advisa dual-chamber device implanted to 2014 still has roughly 6 months of remaining longevity.  She has virtually 100% atrial pacing and never requires ventricular pacing.  She is "atrially pacemaker dependent".  The heart rate histogram distribution appears appropriate.  She has not had any atrial fibrillation in the last 12 months and has only had 8 minutes of atrial fibrillation in the time since her last cardioversion.  Her most recent echocardiogram from  October 2020 shows near normalization of left ventricular systolic function with an EF that is now 50-55%.  Subtle reduction in EF is due to LBBB related dyssynchrony.  EF has improved compared to July 2019 (EF 45-50%), further improved on echo October 2020 (EF 50-55%).  Diastolic function parameters suggest normal left ventricular filling pressures.    Past Medical History:  Diagnosis Date   Allergy    Anginal pain (HCC)    Chronic anticoagulation, CHAD2Vasc score 4 02/09/2014   Chronic diastolic CHF (congestive heart failure) (HCC)    a. 01/2014 Echo: EF 50-55%, Gr1 DD, mild AI/MR.   Chronic kidney disease    First degree atrioventricular block    GERD (gastroesophageal reflux disease)    Heart murmur    HTN (hypertension)    Hyperlipidemia    LBBB (left bundle branch block)    Osteoporosis    Osteopenia   PAF (paroxysmal atrial fibrillation) (HCC)    a. Previously on flecainide;  b. 01/2014 sotalol loaded and dccv performed;  c. Chronic Savaysa Rx.   Presence of permanent cardiac pacemaker    a. 12/26/12 s/p MDT DC PPM.   SSS (sick sinus syndrome) (HCC)    a. s/p PPM-Medtronic placed 12/26/12    Symptomatic sinus bradycardia    Past Surgical History:  Procedure Laterality Date   APPENDECTOMY  1971   BREAST EXCISIONAL BIOPSY Right    CARDIOVERSION N/A 01/22/2014   Procedure: CARDIOVERSION;  Surgeon: Chrystie Nose, MD;  Location: [facility] ENDOSCOPY;  Service: Cardiovascular;  Laterality: N/A;   CARDIOVERSION N/A 04/10/2017   Procedure: CARDIOVERSION;  Surgeon: Jake Bathe, MD;  Location: [facility] ENDOSCOPY;  Service: Cardiovascular;  Laterality: N/A;   CARDIOVERSION N/A 10/23/2019  Procedure: CARDIOVERSION;  Surgeon: Thurmon Fair, MD;  Location: [facility] ENDOSCOPY;  Service: Cardiovascular;  Laterality: N/A;   INSERT / REPLACE / REMOVE PACEMAKER  12/26/2012   Medtronic, Dr. Royann Shivers   NM MYOCAR PERF WALL MOTION  08/05/2009   small to mod. reversible anteroseptal,septal,& apical perfusion  defect. EF 59%.   PERMANENT PACEMAKER INSERTION N/A 12/26/2012   Procedure: PERMANENT PACEMAKER INSERTION;  Surgeon: Thurmon Fair, MD;  Location: [facility] CATH LAB;  Service: Cardiovascular;  Laterality: N/A;   TONSILLECTOMY AND ADENOIDECTOMY  1960's   TUBAL LIGATION  1971   VAGINAL HYSTERECTOMY  1980's     Current Meds  Medication Sig   acetaminophen (TYLENOL) 500 MG tablet Take 500 mg by mouth daily as needed for moderate pain or headache.   amiodarone (PACERONE) 200 MG tablet Take 1 tablet (200 mg total) by mouth daily.   apixaban (ELIQUIS) 2.5 MG TABS tablet Take 1 tablet (2.5 mg total) by mouth 2 (two) times daily.   diltiazem (CARDIZEM CD) 120 MG 24 hr capsule TAKE 1 CAPSULE DAILY   influenza vaccine adjuvanted (FLUAD) 0.5 ML injection Inject into the muscle.   irbesartan (AVAPRO) 150 MG tablet TAKE 1 TABLET DAILY   Pitavastatin Calcium 1 MG TABS TAKE 1 TABLET DAILY   [DISCONTINUED] furosemide (LASIX) 20 MG tablet TAKE 1 TABLET BY MOUTH DAILY *MAY TAKE EXTRA FOR WEIGHT GAIN OF 5LB IN 1 WEEK   [DISCONTINUED] gabapentin (NEURONTIN) 100 MG capsule TAKE 1 CAPSULE TWICE DAILY   [DISCONTINUED] potassium chloride (KLOR-CON) 10 MEQ tablet Take 1 tablet (10 mEq total) by mouth daily.     Allergies:   Azithromycin and Codeine   Social History   Tobacco Use   Smoking status: Never   Smokeless tobacco: Never  Vaping Use   Vaping status: Never Used  Substance Use Topics   Alcohol use: No   Drug use: No     Family Hx: The patient's family history includes Heart attack in her mother; Stroke in her sister.  ROS:   Please see the history of present illness.    All other systems are reviewed and are negative.  Prior CV studies:   The following studies were reviewed today:  Comprehensive pacemaker check today.    Echocardiogram November 06, 2018   1. Left ventricular ejection fraction, by visual estimation, is 50 to 55%. The left ventricle has normal function. There is mildly increased  left ventricular hypertrophy.  2. Left ventricular diastolic parameters are consistent with Grade I diastolic dysfunction (impaired relaxation).  3. Global right ventricle has normal systolic function.The right ventricular size is normal. No increase in right ventricular wall thickness.  4. Left atrial size was normal.  5. Right atrial size was normal.  6. The mitral valve is normal in structure. Trace mitral valve regurgitation.  7. The tricuspid valve is grossly normal. Tricuspid valve regurgitation is mild.  8. Aortic valve regurgitation is mild to moderate.  9. The aortic valve is normal in structure. Aortic valve regurgitation is mild to moderate. No evidence of aortic valve sclerosis or stenosis. 10. The pulmonic valve was grossly normal. Pulmonic valve regurgitation is trivial. 11. Aortic dilatation noted. 12. There is mild to moderate dilatation of the ascending aorta measuring 42 mm. 13. Normal pulmonary artery systolic pressure. 14. A pacer wire is visualized. 15. The atrial septum is grossly normal.  Labs/Other Tests and Data Reviewed:    EKG:  ECG performed today shows atrial paced, ventricular sensed rhythm, prolonged AV delay, LBBB. Recent  Labs: No results found for requested labs within last 365 days.   Recent Lipid Panel Lab Results  Component Value Date/Time   CHOL 182 07/31/2021 10:58 AM   TRIG 119 07/31/2021 10:58 AM   HDL 65 07/31/2021 10:58 AM   CHOLHDL 2.8 07/31/2021 10:58 AM   CHOLHDL 2.5 09/01/2015 09:54 AM   LDLCALC 96 07/31/2021 10:58 AM    Wt Readings from Last 3 Encounters:  08/23/22 157 lb 9.6 oz (71.5 kg)  07/31/21 156 lb (70.8 kg)  01/18/20 150 lb 9.6 oz (68.3 kg)     Objective:    Vital Signs:  BP 102/64 (BP Location: Left Arm, Patient Position: Sitting, Cuff Size: Normal)   Pulse 62   Ht 5\' 2"  (1.575 m)   Wt 157 lb 9.6 oz (71.5 kg)   SpO2 97%   BMI 28.83 kg/m     General: Alert, oriented x3, no distress, healthy left subclavian  pacemaker site Head: no evidence of trauma, PERRL, EOMI, no exophtalmos or lid lag, no myxedema, no xanthelasma; normal ears, nose and oropharynx Neck: normal jugular venous pulsations and no hepatojugular reflux; brisk carotid pulses without delay and no carotid bruits Chest: clear to auscultation, no signs of consolidation by percussion or palpation, normal fremitus, symmetrical and full respiratory excursions Cardiovascular: normal position and quality of the apical impulse, regular rhythm, normal first and paradoxically split second heart sounds, no murmurs, rubs or gallops Abdomen: no tenderness or distention, no masses by palpation, no abnormal pulsatility or arterial bruits, normal bowel sounds, no hepatosplenomegaly Extremities: no clubbing, cyanosis or edema; 2+ radial, ulnar and brachial pulses bilaterally; 2+ right femoral, posterior tibial and dorsalis pedis pulses; 2+ left femoral, posterior tibial and dorsalis pedis pulses; no subclavian or femoral bruits Neurological: grossly nonfocal Psych: Normal mood and affect     ASSESSMENT & PLAN:    1. Chronic diastolic heart failure (HCC)   2. PAF (paroxysmal atrial fibrillation) (HCC)   3. Acquired thrombophilia (HCC)   4. Pacemaker- MDT implanted 12/26/12   5. SSS (sick sinus syndrome) (HCC)   6. Primary hypertension   7. On amiodarone therapy   8. Pure hypercholesterolemia   9. LBBB (left bundle branch block)       CHF: Clinically euvolemic, excellent functional status.  She has a history of rapid deterioration when she develops persistent atrial fibrillation,  mostly due to tachycardia related cardiomyopathy and due to increased right ventricular pacing.  Has not required any diuretic dose adjustment since her cardioversion almost 3 years ago.  Will decrease the furosemide and the potassium supplement to every other day only. AFib: Has not recurred since we started amiodarone about 3 years ago, but was poorly tolerated when  persistent.   CHADSVasc 5-6 (age 24, gender, HTN, CHF, possible CAD).  On anticoagulation. Anticoagulation: Well-tolerated without bleeding problems. PM: Normal device function.  Virtually 100% atrial paced, ventricular sensed rhythm.  Good heart rate histogram distribution with current sensor settings. SSS: She does not have any meaningful underlying atrial rhythm other than occasional PACs.  She is "atrially pacemaker dependent".   Hypertension: Excellent control, possibly excessively slow.  Decrease the diuretic to every other day. Amiodarone: Recheck liver and thyroid function tests.  Discussed the potential for numerous side effects including thyroid disorder, hepatotoxicity, lung fibrosis, skin photosensitivity, ocular complications, multiple drug interactions, etc. we have previously discussed referral to EP for ablation, but at age 40 she prefers a conservative approach.  Neuropathy symptoms may be related to amiodarone, but are  reasonably well-controlled on gabapentin.  Okay to increase the dose of gabapentin to 200 mg (first in the evening only, then if well-tolerated increase to 200 mg twice daily). HLP: Recheck lipid profile.  She has not tolerated potent statins or higher doses of pitavastatin.  LDL less than 100 satisfactory in the absence of known CAD or PVD.    Medication Adjustments/Labs and Tests Ordered: Current medicines are reviewed at length with the patient today.  Concerns regarding medicines are outlined above.   Tests Ordered: Orders Placed This Encounter  Procedures   Comprehensive metabolic panel   TSH   Lipid panel   EKG 12-Lead    Medication Changes: Meds ordered this encounter  Medications   furosemide (LASIX) 20 MG tablet    Sig: Take 1 tablet (20 mg total) by mouth every other day. TAKE 1 TABLET BY MOUTH EVERY OTHER DAY *MAY TAKE EXTRA FOR WEIGHT GAIN OF 5LB IN 1 WEEK    Dispense:  45 tablet    Refill:  3   potassium chloride (KLOR-CON) 10 MEQ tablet     Sig: Take 1 tablet (10 mEq total) by mouth every other day.    Dispense:  45 tablet    Refill:  3   gabapentin (NEURONTIN) 100 MG capsule    Sig: Take 1 capsule (100 mg total) by mouth every morning AND 2 capsules (200 mg total) at bedtime.    Dispense:  270 capsule    Refill:  3   Patient Instructions  Medication Instructions:  Take Lasix 20 mg every other day (may take an extra dose for weight gain of 5 pounds in one week) Take Potassium 10 meq every other day (on the days that you take Lasix) Gabapentin- take 100 mg every morning and INCREASE BEDTIME DOSE TO 200 mg *If you need a refill on your cardiac medications before your next appointment, please call your pharmacy*   Lab Work: CMP, TSH, Lipid If you have labs (blood work) drawn today and your tests are completely normal, you will receive your results only by: MyChart Message (if you have MyChart) OR A paper copy in the mail If you have any lab test that is abnormal or we need to change your treatment, we will call you to review the results.  Follow-Up: At [facility], you and your health needs are our priority.  As part of our continuing mission to provide you with exceptional heart care, we have created designated Provider Care Teams.  These Care Teams include your primary Cardiologist (physician) and Advanced Practice Providers (APPs -  Physician Assistants and Nurse Practitioners) who all work together to provide you with the care you need, when you need it.  We recommend signing up for the patient portal called "MyChart".  Sign up information is provided on this After Visit Summary.  MyChart is used to connect with patients for Virtual Visits (Telemedicine).  Patients are able to view lab/test results, encounter notes, upcoming appointments, etc.  Non-urgent messages can be sent to your provider as well.   To learn more about what you can do with MyChart, go to [email or web address].    Your next appointment:   1  year(s)  Provider:   Thurmon Fair, MD       Disposition:  Follow up  3 months  Signed, Thurmon Fair, MD  08/23/2022 8:35 AM    [facility]

## 2022-08-27 ENCOUNTER — Encounter: Payer: Self-pay | Admitting: Cardiovascular Disease

## 2022-08-30 ENCOUNTER — Other Ambulatory Visit: Payer: Self-pay | Admitting: Family Medicine

## 2022-08-30 DIAGNOSIS — Z1231 Encounter for screening mammogram for malignant neoplasm of breast: Secondary | ICD-10-CM

## 2022-09-08 ENCOUNTER — Other Ambulatory Visit: Payer: Self-pay | Admitting: Cardiovascular Disease

## 2022-09-08 DIAGNOSIS — I4819 Other persistent atrial fibrillation: Secondary | ICD-10-CM

## 2022-09-11 NOTE — Telephone Encounter (Signed)
Prescription refill request for Eliquis received. Indication:afib Last office visit:8/24 Scr:1.70 8/24 Age: 82 Weight:71.5  kg  Prescription refilled

## 2022-09-21 ENCOUNTER — Other Ambulatory Visit (HOSPITAL_BASED_OUTPATIENT_CLINIC_OR_DEPARTMENT_OTHER): Payer: Self-pay

## 2022-09-21 MED ORDER — INFLUENZA VAC A&B SURF ANT ADJ 0.5 ML IM SUSY
0.5000 mL | PREFILLED_SYRINGE | Freq: Once | INTRAMUSCULAR | 0 refills | Status: AC
  Filled 2022-09-21: qty 0.5, 1d supply, fill #0

## 2022-09-21 MED ORDER — COVID-19 MRNA VAC-TRIS(PFIZER) 30 MCG/0.3ML IM SUSY
0.3000 mL | PREFILLED_SYRINGE | Freq: Once | INTRAMUSCULAR | 0 refills | Status: AC
  Filled 2022-09-21: qty 0.3, 1d supply, fill #0

## 2022-09-26 ENCOUNTER — Ambulatory Visit: Payer: Medicare Other

## 2022-10-05 ENCOUNTER — Ambulatory Visit (INDEPENDENT_AMBULATORY_CARE_PROVIDER_SITE_OTHER): Payer: Medicare Other

## 2022-10-05 DIAGNOSIS — I495 Sick sinus syndrome: Secondary | ICD-10-CM

## 2022-10-09 ENCOUNTER — Ambulatory Visit: Payer: Medicare Other

## 2022-10-09 LAB — CUP PACEART REMOTE DEVICE CHECK
Battery Remaining Longevity: 4 mo
Battery Voltage: 2.85 V
Brady Statistic AP VP Percent: 0.05 %
Brady Statistic AP VS Percent: 99.45 %
Brady Statistic AS VP Percent: 0 %
Brady Statistic AS VS Percent: 0.5 %
Brady Statistic RA Percent Paced: 99.5 %
Brady Statistic RV Percent Paced: 0.05 %
Date Time Interrogation Session: 20240930130811
Implantable Lead Connection Status: 753985
Implantable Lead Connection Status: 753985
Implantable Lead Implant Date: 20141219
Implantable Lead Implant Date: 20141219
Implantable Lead Location: 753859
Implantable Lead Location: 753860
Implantable Lead Model: 5076
Implantable Lead Model: 5076
Implantable Pulse Generator Implant Date: 20141219
Lead Channel Impedance Value: 361 Ohm
Lead Channel Impedance Value: 361 Ohm
Lead Channel Impedance Value: 418 Ohm
Lead Channel Impedance Value: 437 Ohm
Lead Channel Pacing Threshold Amplitude: 0.625 V
Lead Channel Pacing Threshold Amplitude: 1.125 V
Lead Channel Pacing Threshold Pulse Width: 0.4 ms
Lead Channel Pacing Threshold Pulse Width: 0.4 ms
Lead Channel Sensing Intrinsic Amplitude: 3.75 mV
Lead Channel Sensing Intrinsic Amplitude: 3.75 mV
Lead Channel Sensing Intrinsic Amplitude: 5.375 mV
Lead Channel Sensing Intrinsic Amplitude: 5.375 mV
Lead Channel Setting Pacing Amplitude: 2 V
Lead Channel Setting Pacing Amplitude: 2.5 V
Lead Channel Setting Pacing Pulse Width: 0.4 ms
Lead Channel Setting Sensing Sensitivity: 0.9 mV
Zone Setting Status: 755011
Zone Setting Status: 755011

## 2022-10-10 ENCOUNTER — Ambulatory Visit
Admission: RE | Admit: 2022-10-10 | Discharge: 2022-10-10 | Disposition: A | Discharge status: Home / Self Care | Payer: Medicare Other | Source: Ambulatory Visit | Attending: Family Medicine | Admitting: Family Medicine

## 2022-10-10 DIAGNOSIS — Z1231 Encounter for screening mammogram for malignant neoplasm of breast: Secondary | ICD-10-CM

## 2022-10-11 NOTE — Progress Notes (Signed)
Remote pacemaker transmission.   

## 2022-11-09 ENCOUNTER — Ambulatory Visit (INDEPENDENT_AMBULATORY_CARE_PROVIDER_SITE_OTHER): Payer: Medicare Other

## 2022-11-09 DIAGNOSIS — I495 Sick sinus syndrome: Secondary | ICD-10-CM | POA: Diagnosis not present

## 2022-11-14 LAB — CUP PACEART REMOTE DEVICE CHECK
Battery Remaining Longevity: 3 mo
Battery Voltage: 2.84 V
Brady Statistic AP VP Percent: 0.04 %
Brady Statistic AP VS Percent: 99.55 %
Brady Statistic AS VP Percent: 0 %
Brady Statistic AS VS Percent: 0.4 %
Brady Statistic RA Percent Paced: 99.59 %
Brady Statistic RV Percent Paced: 0.04 %
Date Time Interrogation Session: 20241105155857
Implantable Lead Connection Status: 753985
Implantable Lead Connection Status: 753985
Implantable Lead Implant Date: 20141219
Implantable Lead Implant Date: 20141219
Implantable Lead Location: 753859
Implantable Lead Location: 753860
Implantable Lead Model: 5076
Implantable Lead Model: 5076
Implantable Pulse Generator Implant Date: 20141219
Lead Channel Impedance Value: 361 Ohm
Lead Channel Impedance Value: 361 Ohm
Lead Channel Impedance Value: 418 Ohm
Lead Channel Impedance Value: 418 Ohm
Lead Channel Pacing Threshold Amplitude: 0.75 V
Lead Channel Pacing Threshold Amplitude: 1.25 V
Lead Channel Pacing Threshold Pulse Width: 0.4 ms
Lead Channel Pacing Threshold Pulse Width: 0.4 ms
Lead Channel Sensing Intrinsic Amplitude: 4.375 mV
Lead Channel Sensing Intrinsic Amplitude: 4.375 mV
Lead Channel Sensing Intrinsic Amplitude: 4.625 mV
Lead Channel Sensing Intrinsic Amplitude: 4.625 mV
Lead Channel Setting Pacing Amplitude: 2 V
Lead Channel Setting Pacing Amplitude: 2.5 V
Lead Channel Setting Pacing Pulse Width: 0.4 ms
Lead Channel Setting Sensing Sensitivity: 0.9 mV
Zone Setting Status: 755011
Zone Setting Status: 755011

## 2022-11-16 ENCOUNTER — Other Ambulatory Visit: Payer: Self-pay | Admitting: Cardiovascular Disease

## 2022-11-21 NOTE — Progress Notes (Signed)
Remote pacemaker transmission.   

## 2022-12-10 ENCOUNTER — Ambulatory Visit (INDEPENDENT_AMBULATORY_CARE_PROVIDER_SITE_OTHER): Payer: Medicare Other

## 2022-12-10 DIAGNOSIS — I495 Sick sinus syndrome: Secondary | ICD-10-CM

## 2022-12-17 LAB — CUP PACEART REMOTE DEVICE CHECK
Battery Remaining Longevity: 2 mo
Battery Voltage: 2.84 V
Brady Statistic AP VP Percent: 0.04 %
Brady Statistic AP VS Percent: 99.42 %
Brady Statistic AS VP Percent: 0 %
Brady Statistic AS VS Percent: 0.54 %
Brady Statistic RA Percent Paced: 99.46 %
Brady Statistic RV Percent Paced: 0.04 %
Date Time Interrogation Session: 20241206152137
Implantable Lead Connection Status: 753985
Implantable Lead Connection Status: 753985
Implantable Lead Implant Date: 20141219
Implantable Lead Implant Date: 20141219
Implantable Lead Location: 753859
Implantable Lead Location: 753860
Implantable Lead Model: 5076
Implantable Lead Model: 5076
Implantable Pulse Generator Implant Date: 20141219
Lead Channel Impedance Value: 361 Ohm
Lead Channel Impedance Value: 361 Ohm
Lead Channel Impedance Value: 418 Ohm
Lead Channel Impedance Value: 437 Ohm
Lead Channel Pacing Threshold Amplitude: 0.75 V
Lead Channel Pacing Threshold Amplitude: 1.375 V
Lead Channel Pacing Threshold Pulse Width: 0.4 ms
Lead Channel Pacing Threshold Pulse Width: 0.4 ms
Lead Channel Sensing Intrinsic Amplitude: 3.25 mV
Lead Channel Sensing Intrinsic Amplitude: 3.25 mV
Lead Channel Sensing Intrinsic Amplitude: 5.5 mV
Lead Channel Sensing Intrinsic Amplitude: 5.5 mV
Lead Channel Setting Pacing Amplitude: 2 V
Lead Channel Setting Pacing Amplitude: 2.75 V
Lead Channel Setting Pacing Pulse Width: 0.4 ms
Lead Channel Setting Sensing Sensitivity: 0.9 mV
Zone Setting Status: 755011
Zone Setting Status: 755011

## 2023-01-25 ENCOUNTER — Other Ambulatory Visit: Payer: Self-pay

## 2023-01-25 ENCOUNTER — Other Ambulatory Visit: Payer: Self-pay | Admitting: Cardiovascular Disease

## 2023-01-25 DIAGNOSIS — I4819 Other persistent atrial fibrillation: Secondary | ICD-10-CM

## 2023-01-25 MED ORDER — APIXABAN 2.5 MG PO TABS: 2.5000 mg | ORAL_TABLET | Freq: Two times a day (BID) | ORAL | 1 refills | Status: DC

## 2023-01-25 MED ORDER — GABAPENTIN 100 MG PO CAPS: ORAL_CAPSULE | ORAL | 3 refills | Status: DC

## 2023-01-25 MED ORDER — AMIODARONE HCL 200 MG PO TABS: 200.0000 mg | ORAL_TABLET | Freq: Every day | ORAL | 2 refills | Status: DC

## 2023-01-25 MED ORDER — IRBESARTAN 150 MG PO TABS: 150.0000 mg | ORAL_TABLET | Freq: Every day | ORAL | 2 refills | Status: DC

## 2023-01-25 MED ORDER — DILTIAZEM HCL ER COATED BEADS 120 MG PO CP24: 120.0000 mg | ORAL_CAPSULE | Freq: Every day | ORAL | 2 refills | Status: DC

## 2023-01-25 MED ORDER — PITAVASTATIN CALCIUM 1 MG PO TABS: 1.0000 | ORAL_TABLET | Freq: Every day | ORAL | 2 refills | Status: DC

## 2023-01-25 NOTE — Telephone Encounter (Signed)
*  STAT* If patient is at the pharmacy, call can be transferred to refill team.   1. Which medications need to be refilled? (please list name of each medication and dose if known)   gabapentin (NEURONTIN) 100 MG capsule    diltiazem (CARDIZEM CD) 120 MG 24 hr capsule  amiodarone (PACERONE) 200 MG tablet   ELIQUIS 2.5 MG TABS tablet  Pitavastatin Calcium 1 MG TABS  irbesartan (AVAPRO) 150 MG tablet    2. Which pharmacy/location (including street and city if local pharmacy) is medication to be sent to?  [facility] - [place], [place] - [address] AT Portal to Registered [facility] Sites      3. Do they need a 30 day or 90 day supply? 90 day

## 2023-01-25 NOTE — Telephone Encounter (Signed)
Prescription refill request for Eliquis received. Indication:afib Last office visit:8/24 Scr:1.70  8/24 Age: 83 Weight:71.5  kg  Prescription refilled

## 2023-01-25 NOTE — Telephone Encounter (Signed)
Pt is requesting a refill on gabapentin. Would Dr. Royann Shivers like to refill this non cardiac medication? Please address

## 2023-01-25 NOTE — Telephone Encounter (Signed)
Yes please

## 2023-02-22 ENCOUNTER — Telehealth: Payer: Self-pay | Admitting: Cardiovascular Disease

## 2023-02-22 NOTE — Telephone Encounter (Signed)
New message:    Patient says when last saw Dr C, he told her that her pacemaker would need to be replaced no later than February. Patient says she is waiting to hear when.  She says it is February 14th already.

## 2023-02-22 NOTE — Telephone Encounter (Signed)
I am glad she called. We have not received her planned pacemaker downloads from January or February. Will ask our device clinic to trouble shoot. Please make sure her transmitter is plugged in and powered.

## 2023-02-22 NOTE — Telephone Encounter (Signed)
Called patient left message on personal voice mail I will send message to Dr.Croitoru.

## 2023-02-25 ENCOUNTER — Other Ambulatory Visit: Payer: Self-pay

## 2023-02-25 ENCOUNTER — Telehealth: Payer: Self-pay

## 2023-02-25 DIAGNOSIS — I5032 Chronic diastolic (congestive) heart failure: Secondary | ICD-10-CM

## 2023-02-25 NOTE — Progress Notes (Addendum)
Order placed for CBC and BMET and slips printed for patient to pick up.     Implantable Device Instructions    Monica Harvey  02/25/2023  You are scheduled for a PPM generator change on Thursday, March 13 with Dr. Rachelle Hora Croitoru.  1. Pre procedure Lab testing: You will come to the [facility] lab for your pre-procedure blood work.  These are walk in labs- you will not need an appointment and you do not need to be fasting. The lab is open from 8-4:30 pm.  The lab is closed from 1-2 pm for lunch.   2. Please arrive at the Main Entrance A at [facility]: [address] [place], [place] [address] on March 13th at 1:30 PM (This time is two hours before your procedure to ensure your preparation). Free valet parking service is available. You will check in at ADMITTING. The support person will be asked to wait in the waiting room.  It is OK to have someone drop you off and come back when you are ready to be discharged.        Special note: Every effort is made to have your procedure done on time. Please understand that emergencies sometimes delay  scheduled procedures.  3.  No eating or drinking after midnight prior to procedure.     4.  Medication instructions:  On the morning of your procedure hold your Eliquis (Apixaban) for 1 day(s) prior to your procedure. Your last dose will be Tuesday, March 11, PM dose.   5.  The night before your procedure and the morning of your procedure scrub your neck/chest with CHG surgical scrub.  See instruction letter.  6. Plan to go home the same day, you will only stay overnight if medically necessary. 7.  You MUST have a responsible adult to drive you home. 8.   An adult MUST be with you the first 24 hours after you arrive home. 9..  Bring a current list of your medications, and the last time and date medication taken. 10. Bring ID and current insurance cards. 11. .Please wear clothes that are easy to get on and off and wear slip-on shoes.    You  will follow up with the [facility] Device clinic 10-14 days after your procedure.  You will follow up with Dr. Rachelle Hora Croitoru 91 days after your procedure.  These appointments will be made for you.   * If you have ANY questions after you get home, please call the office at [phone] or send a MyChart message.  FYI: For your safety, and to allow [place] to monitor your vital signs accurately during the surgery/procedure we request that if you have artificial nails, gel coating, SNS etc. Please have those removed prior to your surgery/procedure. Not having the nail coverings /polish removed may result in cancellation or delay of your surgery/procedure.    [facility] - Preparing For Surgery    Before surgery, you can play an important role. Because skin is not sterile, your skin needs to be as free of germs as possible. You can reduce the number of germs on your skin by washing with CHG (chlorahexidine gluconate) Soap before surgery.  CHG is an antiseptic cleaner which kills germs and bonds with the skin to continue killing germs even after washing.  Please do not use if you have an allergy to CHG or antibacterial soaps.  If your skin becomes reddened/irritated stop using the CHG.   Do not shave (including legs  and underarms) for at least 48 hours prior to first CHG shower.  It is OK to shave your face.  Please follow these instructions carefully:  1.  Shower the night before surgery and the morning of surgery with CHG.  2.  If you choose to wash your hair, wash your hair first as usual with your normal shampoo.  3.  After you shampoo, rinse your hair and body thoroughly to remove the shampoo.  4.  Use CHG as you would any other liquid soap.  You can apply CHG directly to the skin and wash gently with a clean washcloth. 5.  Apply the CHG Soap to your body ONLY FROM THE NECK DOWN.  Do not use on open wounds or open sores.  Avoid contact with your eyes, ears, mouth and genitals (private parts).  Wash  genitals (private parts) with your normal soap.  6.  Wash thoroughly, paying special attention to the area where your surgery will be performed.  7.  Thoroughly rinse your body with warm water from the neck down.   8.  DO NOT shower/wash with your normal soap after using and rinsing off the CHG soap.  9.  Pat yourself dry with a clean towel.           10.  Wear clean pajamas.           11.  Place clean sheets on your bed the night of your first shower and do not sleep with pets.  Day of Surgery: Do not apply any deodorants/lotions.  Please wear clean clothes to the hospital/surgery center.

## 2023-02-25 NOTE — Telephone Encounter (Signed)
Called and let patient know that her medtronic dual chamber pacemaker exchange has been scheduled for March 13th 2025 at 3:30 pm.  I also explained to her that I would leave a bottle of the hibicleans in a paper bag at the front office desk for pickup.  She was also informed that she will need blood work and the lab slips will be in the bag with the instructions for the procedure. She stated that will come to the office to pick up package and have lab work completed.

## 2023-02-25 NOTE — Telephone Encounter (Signed)
Please schedule for a pacemaker generator change March 13 at 3:30 PM

## 2023-02-25 NOTE — Telephone Encounter (Signed)
Spoke to patient she was not sure about having a transmitter.Advised I will send message to device team.

## 2023-02-25 NOTE — Telephone Encounter (Signed)
   Device has reached RRT as of 01/26/23. Patient is aware and is aware of her remote monitor and how to use it, ensures it is plugged in currently.  She knows Dr. Erin Hearing office will be reaching out to set up the gen change.

## 2023-02-26 LAB — BASIC METABOLIC PANEL
BUN/Creatinine Ratio: 14 (ref 12–28)
BUN: 26 mg/dL (ref 8–27)
CO2: 23 mmol/L (ref 20–29)
Calcium: 9.7 mg/dL (ref 8.7–10.3)
Chloride: 103 mmol/L (ref 96–106)
Creatinine, Ser: 1.81 mg/dL — ABNORMAL HIGH (ref 0.57–1.00)
Glucose: 87 mg/dL (ref 70–99)
Potassium: 4.7 mmol/L (ref 3.5–5.2)
Sodium: 139 mmol/L (ref 134–144)
eGFR: 28 mL/min/{1.73_m2} — ABNORMAL LOW (ref >59)

## 2023-02-26 LAB — CBC
Hematocrit: 39.7 % (ref 34.0–46.6)
Hemoglobin: 13.3 g/dL (ref 11.1–15.9)
MCH: 30.9 pg (ref 26.6–33.0)
MCHC: 33.5 g/dL (ref 31.5–35.7)
MCV: 92 fL (ref 79–97)
Platelets: 283 10*3/uL (ref 150–450)
RBC: 4.3 x10E6/uL (ref 3.77–5.28)
RDW: 12.6 % (ref 11.7–15.4)
WBC: 7.6 10*3/uL (ref 3.4–10.8)

## 2023-03-05 ENCOUNTER — Other Ambulatory Visit (HOSPITAL_BASED_OUTPATIENT_CLINIC_OR_DEPARTMENT_OTHER): Payer: Self-pay

## 2023-03-05 MED ORDER — CAPVAXIVE 0.5 ML IM SOSY
PREFILLED_SYRINGE | INTRAMUSCULAR | 0 refills | Status: DC
  Filled 2023-03-05: qty 0.5, 1d supply, fill #0

## 2023-03-20 ENCOUNTER — Telehealth: Payer: Self-pay | Admitting: Cardiovascular Disease

## 2023-03-20 NOTE — Telephone Encounter (Signed)
 Called patient and informed her that I had talked with Dr C regarding her NPO after midnight status for pacemaker exchange. Dr C said that she could eat breakfast but stop eating/drinking after that as procedure is for 3:30 pm. She voiced understanding and confirmed that she held her eliquis today.

## 2023-03-20 NOTE — Pre-Procedure Instructions (Signed)
 Instructed patient on the following items: Arrival time 1300 Nothing to eat or drink after breakfast No meds AM of procedure Responsible person to drive you home and stay with you for 24 hrs Wash with special soap night before and morning of procedure If on anti-coagulant drug instructions Eliquis-last dose 3/11

## 2023-03-20 NOTE — Telephone Encounter (Signed)
 New Message:      Patient says she is having her pacemaker replaced on tomorrow(03-21-23). She wants to know since her procedure is not until the afternoon, does she have to stop eating or drinking after midnight or can it be later?

## 2023-03-21 ENCOUNTER — Ambulatory Visit (HOSPITAL_COMMUNITY)
Admission: RE | Admit: 2023-03-21 | Discharge: 2023-03-21 | Disposition: A | Discharge status: Home / Self Care | Payer: Medicare Other | Attending: Cardiovascular Disease | Admitting: Cardiovascular Disease

## 2023-03-21 ENCOUNTER — Encounter (HOSPITAL_COMMUNITY)
Admission: RE | Disposition: A | Discharge status: Home / Self Care | Payer: Self-pay | Source: Home / Self Care | Attending: Cardiovascular Disease

## 2023-03-21 ENCOUNTER — Other Ambulatory Visit: Payer: Self-pay

## 2023-03-21 DIAGNOSIS — I4819 Other persistent atrial fibrillation: Secondary | ICD-10-CM | POA: Insufficient documentation

## 2023-03-21 DIAGNOSIS — Z45010 Encounter for checking and testing of cardiac pacemaker pulse generator [battery]: Secondary | ICD-10-CM | POA: Diagnosis not present

## 2023-03-21 DIAGNOSIS — E1122 Type 2 diabetes mellitus with diabetic chronic kidney disease: Secondary | ICD-10-CM | POA: Insufficient documentation

## 2023-03-21 DIAGNOSIS — Z7901 Long term (current) use of anticoagulants: Secondary | ICD-10-CM | POA: Insufficient documentation

## 2023-03-21 DIAGNOSIS — Z79899 Other long term (current) drug therapy: Secondary | ICD-10-CM | POA: Diagnosis not present

## 2023-03-21 DIAGNOSIS — E785 Hyperlipidemia, unspecified: Secondary | ICD-10-CM | POA: Diagnosis not present

## 2023-03-21 DIAGNOSIS — N184 Chronic kidney disease, stage 4 (severe): Secondary | ICD-10-CM | POA: Diagnosis not present

## 2023-03-21 DIAGNOSIS — I495 Sick sinus syndrome: Secondary | ICD-10-CM | POA: Insufficient documentation

## 2023-03-21 DIAGNOSIS — Z8249 Family history of ischemic heart disease and other diseases of the circulatory system: Secondary | ICD-10-CM | POA: Insufficient documentation

## 2023-03-21 DIAGNOSIS — I130 Hypertensive heart and chronic kidney disease with heart failure and stage 1 through stage 4 chronic kidney disease, or unspecified chronic kidney disease: Secondary | ICD-10-CM | POA: Insufficient documentation

## 2023-03-21 DIAGNOSIS — I5032 Chronic diastolic (congestive) heart failure: Secondary | ICD-10-CM | POA: Insufficient documentation

## 2023-03-21 HISTORY — PX: PPM GENERATOR CHANGEOUT: EP1233

## 2023-03-21 SURGERY — PPM GENERATOR CHANGEOUT

## 2023-03-21 MED ORDER — SODIUM CHLORIDE 0.9% FLUSH: 3.0000 mL | INTRAVENOUS | Status: DC | PRN

## 2023-03-21 MED ORDER — SODIUM CHLORIDE 0.9 % IV SOLN: INTRAVENOUS | Status: DC | PRN

## 2023-03-21 MED ORDER — CEFAZOLIN SODIUM-DEXTROSE 2-4 GM/100ML-% IV SOLN
INTRAVENOUS | Status: AC
  Administered 2023-03-21: 2 g via INTRAVENOUS
  Filled 2023-03-21: qty 100

## 2023-03-21 MED ORDER — APIXABAN 2.5 MG PO TABS: 2.5000 mg | ORAL_TABLET | Freq: Two times a day (BID) | ORAL | 1 refills | Status: DC

## 2023-03-21 MED ORDER — ACETAMINOPHEN 325 MG PO TABS: 325.0000 mg | ORAL_TABLET | ORAL | Status: DC | PRN

## 2023-03-21 MED ORDER — ONDANSETRON HCL 4 MG/2ML IJ SOLN: 4.0000 mg | Freq: Four times a day (QID) | INTRAMUSCULAR | Status: DC | PRN

## 2023-03-21 MED ORDER — CHLORHEXIDINE GLUCONATE 4 % EX SOLN
4.0000 | Freq: Once | CUTANEOUS | Status: DC
  Filled 2023-03-21: qty 60

## 2023-03-21 MED ORDER — FENTANYL CITRATE (PF) 100 MCG/2ML IJ SOLN
INTRAMUSCULAR | Status: AC
  Filled 2023-03-21: qty 2

## 2023-03-21 MED ORDER — SODIUM CHLORIDE 0.9 % IV SOLN: 80.0000 mg | INTRAVENOUS | Status: AC

## 2023-03-21 MED ORDER — POVIDONE-IODINE 10 % EX SWAB
2.0000 | Freq: Once | CUTANEOUS | Status: AC
  Administered 2023-03-21: 2 via TOPICAL

## 2023-03-21 MED ORDER — SODIUM CHLORIDE 0.9% FLUSH: 3.0000 mL | Freq: Two times a day (BID) | INTRAVENOUS | Status: DC

## 2023-03-21 MED ORDER — SODIUM CHLORIDE 0.9 % IV SOLN
INTRAVENOUS | Status: AC
  Administered 2023-03-21: 80 mg
  Filled 2023-03-21: qty 2

## 2023-03-21 MED ORDER — CEFAZOLIN SODIUM-DEXTROSE 2-4 GM/100ML-% IV SOLN: 2.0000 g | INTRAVENOUS | Status: AC

## 2023-03-21 MED ORDER — MIDAZOLAM HCL 2 MG/2ML IJ SOLN
INTRAMUSCULAR | Status: AC
  Filled 2023-03-21: qty 2

## 2023-03-21 MED ORDER — LIDOCAINE HCL (PF) 1 % IJ SOLN
INTRAMUSCULAR | Status: AC
  Filled 2023-03-21: qty 60

## 2023-03-21 MED ORDER — LIDOCAINE HCL (PF) 1 % IJ SOLN
INTRAMUSCULAR | Status: DC | PRN
  Administered 2023-03-21: 50 mL

## 2023-03-21 SURGICAL SUPPLY — 7 items
CABLE SURGICAL S-101-97-12 (CABLE) ×2 IMPLANT
IPG PACE AZUR XT DR MRI W1DR01 (Pacemaker) IMPLANT
PACE AZURE XT DR MRI W1DR01 (Pacemaker) ×1 IMPLANT
PAD DEFIB RADIO PHYSIO CONN (PAD) ×2 IMPLANT
POUCH AIGIS-R ANTIBACT PPM (Mesh General) ×1 IMPLANT
POUCH AIGIS-R ANTIBACT PPM MED (Mesh General) IMPLANT
TRAY PACEMAKER INSERTION (PACKS) ×2 IMPLANT

## 2023-03-21 NOTE — Discharge Instructions (Addendum)

## 2023-03-21 NOTE — Op Note (Signed)
 Procedure report  Procedure performed:  Dual chamber pacemaker generator changeout    Reason for procedure:  1. Device generator at elective replacement interval  2. Tachycardia-bradycardia syndrome Procedure performed by:  Thurmon Fair, MD  Complications:  None  Estimated blood loss:  <5 mL  Medications administered during procedure:  Ancef 2 g intravenously,  lidocaine 1% 30 mL locally New Generator Medtronic Azure XT DR MRI model number [id number], serial number [id number] Right atrial lead (chronic) Medtronic, model number [id number], serial number [id number] (implanted 12/26/2012) Right ventricular lead (chronic)  Medtronic, model number [id number], serial number  [id number] (implanted 12/26/2012)  Explanted generator Medtronic Advise,  model number [id number], serial number  [id number] (implanted 12/26/2012)  Procedure details:  After the risks and benefits of the procedure were discussed the patient provided informed consent. She was brought to the cardiac catheter lab in the fasting state. The patient was prepped and draped in usual sterile fashion. Local anesthesia with 1% lidocaine was administered to to the left infraclavicular area. A 5-6cm horizontal incision was made parallel with and 2-3 cm caudal to the left clavicle, in the area of an old scar.  Using minimal electrocautery and mostly sharp and blunt dissection the prepectoral pocket was opened carefully to avoid injury to the loops of chronic leads. Extensive dissection was not necessary. The device was explanted. The pocket was carefully inspected for hemostasis and flushed with copious amounts of antibiotic solution.  The leads were disconnected from the old generator and testing of the lead parameters later showed excellent values. The new generator was connected to the chronic leads, with appropriate pacing noted.   The entire system was then carefully inserted in the pocket with care been taking that the leads and device assumed  a comfortable position without pressure on the incision. Great care was taken that the leads be located deep to the generator. The pocket was then closed in layers using 2 layers of 2-0 Vicryl and cutaneous staples after which a sterile dressing was applied.   At the end of the procedure the following lead parameters were encountered:   Right atrial lead sensed P waves 3.3 mV, impedance 399 ohms, threshold 0.75 at 0.4 ms pulse width.  Right ventricular lead sensed R waves  5.9 mV, impedance 437 ohms, threshold 1.25 at 0.4 ms pulse width.   Thurmon Fair, MD, [facility] [phone] office [phone] pager

## 2023-03-21 NOTE — Progress Notes (Signed)
 Pt did not have 10 day wound check app, Dr Royann Shivers aware, office will call pt tomorrow or pt will call office if not reached.

## 2023-03-21 NOTE — H&P (Signed)
 Cardiology Admission History and Physical   Patient ID: Monica Harvey MRN: [id number]; DOB: Mar 19, 1940   Admission date: 03/21/2023  PCP:  Pcp, No   [facility] Providers Cardiologist:  Thurmon Fair, MD        Chief Complaint:  pacemaker at ERI  Patient Profile:   Monica Harvey is a 83 y.o. female with tachycardia-bradycardia syndrome (sinus node arrest and persistent atrial fibrillation) who is being seen 03/21/2023 for pacemaker generator change out due to battery depletion  History of Present Illness:   Monica Harvey has a longstanding history of recurrent persistent atrial fibrillation that has been complicated in the past by heart failure due to tachycardia related cardiomyopathy, sinus node dysfunction, left bundle branch block.  She is "atrially pacemaker dependent" but has normal AV conduction with atrial pacing.  She has not had clinically meaningful recurrence of atrial fibrillation since the cardioversion performed in October 2021.  At that time she was switched from sotalol to amiodarone and has had only very brief episodes of breakthrough (less than 10 minutes in duration).  Her current pacemaker is a Medtronic advisor dual-chamber device implanted in 2014 that recently reached ERI.  LVEF dropped to 45-50% during tachycardia cardiomyopathy in 2019 and subsequently returned to normal.  He does have borderline LVEF reduction due to LBBB related dyssynchrony.   Past Medical History:  Diagnosis Date   Allergy    Anginal pain (HCC)    Chronic anticoagulation, CHAD2Vasc score 4 02/09/2014   Chronic diastolic CHF (congestive heart failure) (HCC)    a. 01/2014 Echo: EF 50-55%, Gr1 DD, mild AI/MR.   Chronic kidney disease    First degree atrioventricular block    GERD (gastroesophageal reflux disease)    Heart murmur    HTN (hypertension)    Hyperlipidemia    LBBB (left bundle branch block)    Osteoporosis    Osteopenia   PAF (paroxysmal atrial fibrillation)  (HCC)    a. Previously on flecainide;  b. 01/2014 sotalol loaded and dccv performed;  c. Chronic Savaysa Rx.   Presence of permanent cardiac pacemaker    a. 12/26/12 s/p MDT DC PPM.   SSS (sick sinus syndrome) (HCC)    a. s/p PPM-Medtronic placed 12/26/12    Symptomatic sinus bradycardia     Past Surgical History:  Procedure Laterality Date   APPENDECTOMY  1971   BREAST EXCISIONAL BIOPSY Right    CARDIOVERSION N/A 01/22/2014   Procedure: CARDIOVERSION;  Surgeon: Chrystie Nose, MD;  Location: [facility] ENDOSCOPY;  Service: Cardiovascular;  Laterality: N/A;   CARDIOVERSION N/A 04/10/2017   Procedure: CARDIOVERSION;  Surgeon: Jake Bathe, MD;  Location: [facility] ENDOSCOPY;  Service: Cardiovascular;  Laterality: N/A;   CARDIOVERSION N/A 10/23/2019   Procedure: CARDIOVERSION;  Surgeon: Thurmon Fair, MD;  Location: [facility] ENDOSCOPY;  Service: Cardiovascular;  Laterality: N/A;   INSERT / REPLACE / REMOVE PACEMAKER  12/26/2012   Medtronic, Dr. Royann Shivers   NM MYOCAR PERF WALL MOTION  08/05/2009   small to mod. reversible anteroseptal,septal,& apical perfusion defect. EF 59%.   PERMANENT PACEMAKER INSERTION N/A 12/26/2012   Procedure: PERMANENT PACEMAKER INSERTION;  Surgeon: Thurmon Fair, MD;  Location: [facility] CATH LAB;  Service: Cardiovascular;  Laterality: N/A;   TONSILLECTOMY AND ADENOIDECTOMY  1960's   TUBAL LIGATION  1971   VAGINAL HYSTERECTOMY  1980's     Medications Prior to Admission: Prior to Admission medications   Medication Sig Start Date End Date Taking? Authorizing Provider  acetaminophen (TYLENOL)  500 MG tablet Take 500 mg by mouth daily as needed for moderate pain or headache.   Yes [provider]  amiodarone (PACERONE) 100 MG tablet Take 100 mg by mouth daily.   Yes [provider]  apixaban (ELIQUIS) 2.5 MG TABS tablet Take 1 tablet (2.5 mg total) by mouth 2 (two) times daily. 01/25/23  Yes Marlo Goodrich, MD  diltiazem (CARDIZEM CD) 120 MG 24 hr capsule Take 1  capsule (120 mg total) by mouth daily. 01/25/23  Yes Makenli Derstine, MD  gabapentin (NEURONTIN) 100 MG capsule Take 1 capsule (100 mg total) by mouth every morning AND 2 capsules (200 mg total) at bedtime. 01/25/23  Yes Jaymi Tinner, MD  irbesartan (AVAPRO) 150 MG tablet Take 1 tablet (150 mg total) by mouth daily. 01/25/23  Yes Sonoma Firkus, MD  Pitavastatin Calcium 1 MG TABS Take 1 tablet (1 mg total) by mouth daily. 01/25/23  Yes Cannan Beeck, MD  furosemide (LASIX) 20 MG tablet Take 1 tablet (20 mg total) by mouth every other day. TAKE 1 TABLET BY MOUTH EVERY OTHER DAY *MAY TAKE EXTRA FOR WEIGHT GAIN OF 5LB IN 1 WEEK Patient taking differently: Take 20 mg by mouth daily as needed for fluid or edema. 08/23/22   Keionte Swicegood, MD  potassium chloride (KLOR-CON) 10 MEQ tablet Take 1 tablet (10 mEq total) by mouth every other day. Patient taking differently: Take 10 mEq by mouth daily as needed (Take with Lasix). 08/23/22   Junious Ragone, Rachelle Hora, MD     Allergies:    Allergies  Allergen Reactions   Azithromycin Palpitations    Double vision   Codeine Nausea And Vomiting    Social History:   Social History   Socioeconomic History   Marital status: Married    Spouse name: Publishing copy   Number of children: 1   Years of education: Not on file   Highest education level: Not on file  Occupational History   Not on file  Tobacco Use   Smoking status: Never   Smokeless tobacco: Never  Vaping Use   Vaping status: Never Used  Substance and Sexual Activity   Alcohol use: No   Drug use: No   Sexual activity: Not Currently  Other Topics Concern   Not on file  Social History Narrative   ** Merged History Encounter **       Social Drivers of Corporate investment banker Strain: Not on file  Food Insecurity: Not on file  Transportation Needs: Not on file  Physical Activity: Not on file  Stress: Not on file  Social Connections: Not on file  Intimate Partner Violence: Not on file     Family History:   The patient's family history includes Heart attack in her mother; Stroke in her sister.    ROS:  Please see the history of present illness.  All other ROS reviewed and negative.     Physical Exam/Data:   Vitals:   03/21/23 1337  BP: (!) 149/58  Pulse: 70  Resp: 16  Temp: 98.1 F (36.7 C)  TempSrc: Oral  SpO2: 95%  Weight: 70.3 kg  Height: 5\' 2"  (1.575 m)   No intake or output data in the 24 hours ending 03/21/23 1442    03/21/2023    1:37 PM 08/23/2022    7:52 AM 07/31/2021    9:57 AM  Last 3 Weights  Weight (lbs) 155 lb 157 lb 9.6 oz 156 lb  Weight (kg) 70.308 kg 71.487 kg 70.761 kg  Body mass index is 28.35 kg/m.  General:  Well nourished, well developed, in no acute distress.  Healthy left subclavian pacemaker site HEENT: normal Neck: no JVD Vascular: No carotid bruits; Distal pulses 2+ bilaterally   Cardiac:  normal S1, paradoxically split S2; RRR; no murmur  Lungs:  clear to auscultation bilaterally, no wheezing, rhonchi or rales  Abd: soft, nontender, no hepatomegaly  Ext: no edema Musculoskeletal:  No deformities, BUE and BLE strength normal and equal Skin: warm and dry  Neuro:  CNs 2-12 intact, no focal abnormalities noted Psych:  Normal affect    EKG:  The ECG that was done 08/23/2022 was personally reviewed and demonstrates atrial paced, ventricular sensed rhythm with left bundle branch block the QRS duration is 150 ms.    Assessment and Plan:   Sinus node dysfunction: she is "atrially dependent".  The heart rate histogram shows that the current sensor settings are appropriate. Persistent atrial fibrillation: Very few and very brief episodes of atrial fibrillation breakthrough since she started amiodarone in October 2021.  Previously had been on sotalol with good results for several years, but eventual failure.  He is on chronic anticoagulation. Amiodarone: No clear evidence of side effects to date. She has had a chronically  elevated alkaline phosphatase but the transaminases bilirubin and albumin have been normal.  Mild alkaline phosphatase elevation predates initiation of amiodarone therapy.  She also has mild hypercalcemia so I suspect the alkaline phosphatase is due to metabolic bone disease, possibly in the setting of her moderate chronic kidney disease.  No signs of thyroid disease and most recent TSH was normal at 2.38.  She does have some symptoms of neuropathy, but it is unclear whether these are due to amiodarone.  The symptoms are pretty well-controlled on gabapentin. Anticoagulation: Chronically on Eliquis, dose adjusted for age and reduced renal function.  Has not had bleeding complications. HLP: Did not tolerate most statins well, but has done okay with Livalo.  LDL cholesterol is not quite at target (less than 100, she does not have known CAD or PAD).  Unfortunately she has not tolerated higher doses of Livalo either. History of heart failure: This was due to tachycardia cardiomyopathy and has reversed with rhythm control.  Clinically euvolemic, NYHA functional class I. CKD4: Creatinine clearance is hovering around 28-30. Pacemaker battery depletion: For pacemaker generator change out today.  Due to the serious complications that would result from possible device infection, will use an antibiotic pouch. The procedure has been fully reviewed with the patient and written informed consent has been obtained.  Current leads have excellent parameters and will be preserved.  She has pacemaker conditional leads and her new pacemaker system will also be MRI conditional.   Risk Assessment/Risk Scores:         CHA2DS2-VASc Score = 5   This indicates a 7.2% annual risk of stroke. The patient's score is based upon: CHF History: 1 HTN History: 1 Diabetes History: 0 Stroke History: 0 Vascular Disease History: 0 Age Score: 2 Gender Score: 1     Code Status: Full Code  For questions or updates, please contact  [facility] Please consult [email or web address] for contact info under     Signed, Thurmon Fair, MD  03/21/2023 2:42 PM

## 2023-03-22 ENCOUNTER — Encounter (HOSPITAL_COMMUNITY): Payer: Self-pay | Admitting: Cardiovascular Disease

## 2023-04-03 ENCOUNTER — Ambulatory Visit: Attending: Cardiology

## 2023-04-03 DIAGNOSIS — I495 Sick sinus syndrome: Secondary | ICD-10-CM | POA: Insufficient documentation

## 2023-04-03 NOTE — Progress Notes (Signed)
 Normal dual chamber pacemaker wound check. Presenting rhythm: SB 40 . Wound well healed. Routine testing performed. Thresholds, sensing, and impedances consistent with implant measurements and at chronic ouputs, no new leads implanted. No episodes.   Pt enrolled in remote follow-up.  Patient does not have 91 day f/u with MD. Staff message sent to EP scheduler.

## 2023-04-03 NOTE — Patient Instructions (Signed)

## 2023-05-06 ENCOUNTER — Other Ambulatory Visit: Payer: Self-pay

## 2023-05-06 MED ORDER — FUROSEMIDE 20 MG PO TABS: 20.0000 mg | ORAL_TABLET | ORAL | 1 refills | Status: DC

## 2023-05-10 ENCOUNTER — Telehealth: Payer: Self-pay | Admitting: Cardiovascular Disease

## 2023-05-10 MED ORDER — FUROSEMIDE 20 MG PO TABS: ORAL_TABLET | ORAL | 1 refills | Status: DC

## 2023-05-10 MED ORDER — POTASSIUM CHLORIDE ER 10 MEQ PO TBCR: 10.0000 meq | EXTENDED_RELEASE_TABLET | ORAL | 1 refills | Status: DC

## 2023-05-10 NOTE — Telephone Encounter (Signed)
*  STAT* If patient is at the pharmacy, call can be transferred to refill team.   1. Which medications need to be refilled? (please list name of each medication and dose if known) furosemide  (LASIX ) 20 MG tablet   potassium chloride  (KLOR-CON ) 10 MEQ tablet   2. Which pharmacy/location (including street and city if local pharmacy) is medication to be sent to? [facility] - [place], [place] - [address] [facility] AT Portal to Reg[facility] Phone: [phone]  Fax: [phone]     3. Do they need a 30 day or 90 day supply? 90

## 2023-05-10 NOTE — Telephone Encounter (Signed)
 Pt's medication was sent to pt's pharmacy as requested. Confirmation received.

## 2023-06-26 ENCOUNTER — Ambulatory Visit (INDEPENDENT_AMBULATORY_CARE_PROVIDER_SITE_OTHER)

## 2023-06-26 DIAGNOSIS — I495 Sick sinus syndrome: Secondary | ICD-10-CM

## 2023-06-27 LAB — CUP PACEART REMOTE DEVICE CHECK
Battery Remaining Longevity: 157 mo
Battery Voltage: 3.21 V
Brady Statistic AP VP Percent: 0.05 %
Brady Statistic AP VS Percent: 99.4 %
Brady Statistic AS VP Percent: 0 %
Brady Statistic AS VS Percent: 0.54 %
Brady Statistic RA Percent Paced: 99.45 %
Brady Statistic RV Percent Paced: 0.05 %
Date Time Interrogation Session: 20250618024241
Implantable Lead Connection Status: 753985
Implantable Lead Connection Status: 753985
Implantable Lead Implant Date: 20141219
Implantable Lead Implant Date: 20141219
Implantable Lead Location: 753859
Implantable Lead Location: 753860
Implantable Lead Model: 5076
Implantable Lead Model: 5076
Implantable Pulse Generator Implant Date: 20250313
Lead Channel Impedance Value: 323 Ohm
Lead Channel Impedance Value: 342 Ohm
Lead Channel Impedance Value: 380 Ohm
Lead Channel Impedance Value: 399 Ohm
Lead Channel Pacing Threshold Amplitude: 0.75 V
Lead Channel Pacing Threshold Amplitude: 1.25 V
Lead Channel Pacing Threshold Pulse Width: 0.4 ms
Lead Channel Pacing Threshold Pulse Width: 0.4 ms
Lead Channel Sensing Intrinsic Amplitude: 5.125 mV
Lead Channel Sensing Intrinsic Amplitude: 5.125 mV
Lead Channel Sensing Intrinsic Amplitude: 5.625 mV
Lead Channel Sensing Intrinsic Amplitude: 5.625 mV
Lead Channel Setting Pacing Amplitude: 1.5 V
Lead Channel Setting Pacing Amplitude: 3 V
Lead Channel Setting Pacing Pulse Width: 0.4 ms
Lead Channel Setting Sensing Sensitivity: 1.2 mV
Zone Setting Status: 755011

## 2023-07-04 ENCOUNTER — Ambulatory Visit: Payer: Self-pay | Admitting: Cardiovascular Disease

## 2023-07-15 ENCOUNTER — Telehealth: Payer: Self-pay | Admitting: Family Medicine

## 2023-07-15 NOTE — Telephone Encounter (Unsigned)
 Copied from CRM [id number]. Topic: Clinical - Pink Word Triage >> Jul 15, 2023  8:14 AM Zenovia PARAS wrote: Reason for Triage: Patient states she has a cough with green stuff coming up and diarrhea for a week

## 2023-07-16 ENCOUNTER — Ambulatory Visit (INDEPENDENT_AMBULATORY_CARE_PROVIDER_SITE_OTHER): Admitting: Family Medicine

## 2023-07-16 ENCOUNTER — Encounter: Payer: Self-pay | Admitting: Family Medicine

## 2023-07-16 VITALS — BP 138/76 | HR 67 | Temp 97.2°F | Ht 62.0 in | Wt 157.2 lb

## 2023-07-16 DIAGNOSIS — R059 Cough, unspecified: Secondary | ICD-10-CM

## 2023-07-16 MED ORDER — BENZONATATE 200 MG PO CAPS: 200.0000 mg | ORAL_CAPSULE | Freq: Two times a day (BID) | ORAL | 0 refills | Status: DC | PRN

## 2023-07-16 MED ORDER — AMOXICILLIN-POT CLAVULANATE 875-125 MG PO TABS
1.0000 | ORAL_TABLET | Freq: Two times a day (BID) | ORAL | 0 refills | Status: DC
Start: 2023-07-16 — End: 2023-08-22

## 2023-07-16 NOTE — Progress Notes (Signed)
   Monica Harvey is a 83 y.o. female who presents today for an office visit.  Assessment/Plan:  Cough Overall reassuring exam however does have scattered rhonchi on exam.  This likely started as a viral URI however given persistence of symptoms over the last few weeks as well as recently worsening symptoms concern for bacterial secondary infection.  We will start Augmentin .  We encouraged hydration.  She can use over-the-counter meds as needed as well.  Will also send Tessalon  as needed for cough.  We discussed reasons to return to care.  Follow-up as needed.    Subjective:  HPI:  Patient here with her husband today with cough and congestion.  They have both had similar symptoms for last few weeks.  Her symptoms started in her chest a few weeks ago and have moved into her head and neck.  Cough is productive of green sputum.  She is having some night sweats.  No fevers.  No chills.  No specific treatments tried.  Throat is very sore.        Objective:  Physical Exam: BP 138/76   Pulse 67   Temp (!) 97.2 F (36.2 C) (Temporal)   Ht 5' 2 (1.575 m)   Wt 157 lb 3.2 oz (71.3 kg)   SpO2 97%   BMI 28.75 kg/m   Gen: No acute distress, resting comfortably HEENT: TMs clear.  OP erythematous. CV: Regular rate and rhythm with no murmurs appreciated Pulm: Normal work of breathing, scattered rhonchi noted throughout all lung fields. Neuro: Grossly normal, moves all extremities Psych: Normal affect and thought content      Jadynn Epping M. Kennyth, MD 07/16/2023 10:31 AM

## 2023-07-16 NOTE — Patient Instructions (Addendum)
 It was very nice to see you today!  You have probably developed bronchitis.  Start the antibiotic and cough medication.  Let us  know if not improving.  Return if symptoms worsen or fail to improve.   Take care, Dr Kennyth  PLEASE NOTE:  If you had any lab tests, please let us  know if you have not heard back within a few days. You may see your results on mychart before we have a chance to review them but we will give you a call once they are reviewed by us .   If we ordered any referrals today, please let us  know if you have not heard from their office within the next week.   If you had any urgent prescriptions sent in today, please check with the pharmacy within an hour of our visit to make sure the prescription was transmitted appropriately.   Please try these tips to maintain a healthy lifestyle:  Eat at least 3 REAL meals and 1-2 snacks per day.  Aim for no more than 5 hours between eating.  If you eat breakfast, please do so within one hour of getting up.   Each meal should contain half fruits/vegetables, one quarter protein, and one quarter carbs (no bigger than a computer mouse)  Cut down on sweet beverages. This includes juice, soda, and sweet tea.   Drink at least 1 glass of water with each meal and aim for at least 8 glasses per day  Exercise at least 150 minutes every week.

## 2023-08-17 ENCOUNTER — Other Ambulatory Visit: Payer: Self-pay | Admitting: Cardiovascular Disease

## 2023-08-17 DIAGNOSIS — I4819 Other persistent atrial fibrillation: Secondary | ICD-10-CM

## 2023-08-19 ENCOUNTER — Other Ambulatory Visit: Payer: Self-pay | Admitting: *Deleted

## 2023-08-19 MED ORDER — DILTIAZEM HCL ER COATED BEADS 120 MG PO CP24
120.0000 mg | ORAL_CAPSULE | Freq: Every day | ORAL | 3 refills | Status: AC
Start: 2023-08-19 — End: ?

## 2023-08-19 NOTE — Telephone Encounter (Signed)
 Prescription refill request for Eliquis  received. Indication:afib Last office visit:8/24 Scr:1.81  2/25 Age: 83 Weight:71.3  kg  Prescription refilled

## 2023-08-22 ENCOUNTER — Ambulatory Visit (INDEPENDENT_AMBULATORY_CARE_PROVIDER_SITE_OTHER)

## 2023-08-22 VITALS — Ht 62.0 in | Wt 157.0 lb

## 2023-08-22 DIAGNOSIS — Z0000 Encounter for general adult medical examination without abnormal findings: Secondary | ICD-10-CM

## 2023-08-22 NOTE — Patient Instructions (Signed)
 Monica Harvey , Thank you for taking time out of your busy schedule to complete your Annual Wellness Visit with me. I enjoyed our conversation and look forward to speaking with you again next year. I, as well as your care team,  appreciate your ongoing commitment to your health goals. Please review the following plan we discussed and let me know if I can assist you in the future. Your Game plan/ To Do List    Referrals: If you haven't heard from the office you've been referred to, please reach out to them at the phone provided.   Follow up Visits: We will see or speak with you next year for your Next Medicare AWV with our clinical staff Have you seen your provider in the last 6 months (3 months if uncontrolled diabetes)? Yes  Clinician Recommendations: Each day, aim for 6 glasses of water, plenty of protein in your diet and try to get up and walk/ stretch every hour for 5-10 minutes at a time.        This is a list of the screenings recommended for you:  Health Maintenance  Topic Date Due   Medicare Annual Wellness Visit  Never done   COVID-19 Vaccine (9 - Pfizer risk 2024-25 season) 03/21/2023   Flu Shot  08/09/2023   DTaP/Tdap/Td vaccine (2 - Td or Tdap) 10/31/2031   Pneumococcal Vaccine for age over 36  Completed   DEXA scan (bone density measurement)  Completed   Zoster (Shingles) Vaccine  Completed   HPV Vaccine  Aged Out   Meningitis B Vaccine  Aged Out    Advanced directives: (Copy Requested) Please bring a copy of your health care power of attorney and living will to the office to be added to your chart at your convenience. You can mail to [facility] [address] [place], [place] [address] or email to [email or web address] Advance Care Planning is important because it:  [x]  Makes sure you receive the medical care that is consistent with your values, goals, and preferences  [x]  It provides guidance to your family and loved ones and reduces their  decisional burden about whether or not they are making the right decisions based on your wishes.  Follow the link provided in your after visit summary or read over the paperwork we have mailed to you to help you started getting your Advance Directives in place. If you need assistance in completing these, please reach out to us  so that we can help you!  See attachments for Preventive Care and Fall Prevention Tips.

## 2023-08-22 NOTE — Progress Notes (Signed)
 Subjective:   Monica Harvey is a 83 y.o. who presents for a Medicare Wellness preventive visit.  As a reminder, Annual Wellness Visits don't include a physical exam, and some assessments may be limited, especially if this visit is performed virtually. We may recommend an in-person follow-up visit with your provider if needed.  Visit Complete: Virtual I connected with  [name] on 08/22/23 by a audio enabled telemedicine application and verified that I am speaking with the correct person using two identifiers.  Patient Location: Home  Provider Location: Office/Clinic  I discussed the limitations of evaluation and management by telemedicine. The patient expressed understanding and agreed to proceed.  Vital Signs: Because this visit was a virtual/telehealth visit, some criteria may be missing or patient reported. Any vitals not documented were not able to be obtained and vitals that have been documented are patient reported.  VideoDeclined- This patient declined Librarian, academic. Therefore the visit was completed with audio only.  Persons Participating in Visit: Patient.  AWV Questionnaire: No: Patient Medicare AWV questionnaire was not completed prior to this visit.  Cardiac Risk Factors include: advanced age (>84men, >22 women);dyslipidemia;hypertension     Objective:    Today's Vitals   08/22/23 0913  Weight: 157 lb (71.2 kg)  Height: 5' 2 (1.575 m)   Body mass index is 28.72 kg/m.     08/22/2023    9:20 AM 10/23/2019    7:30 AM 06/13/2018    7:12 AM 04/08/2017    4:03 PM 04/19/2016   11:36 PM 04/18/2016    1:03 AM 01/21/2014    5:57 PM  Advanced Directives  Does Patient Have a Medical Advance Directive? Yes Yes Yes Yes  Yes  No  Yes   Type of Estate agent of [place];Living will Healthcare Power of [place];Living will Living will Living will Living will  Living will   Does patient want to make changes to medical  advance directive?    No - Patient declined  No - Patient declined   No - Patient declined   Copy of Healthcare Power of Attorney in Chart? No - copy requested No - copy requested     No - copy requested   Would patient like information on creating a medical advance directive?     No - Patient declined        Data saved with a previous flowsheet row definition    Current Medications (verified) Outpatient Encounter Medications as of 08/22/2023  Medication Sig   amiodarone  (PACERONE ) 100 MG tablet Take 100 mg by mouth daily.   benzonatate  (TESSALON ) 200 MG capsule Take 1 capsule (200 mg total) by mouth 2 (two) times daily as needed for cough.   diltiazem  (CARDIZEM  CD) 120 MG 24 hr capsule Take 1 capsule (120 mg total) by mouth daily.   ELIQUIS  2.5 MG TABS tablet TAKE 1 TABLET TWICE A DAY   furosemide  (LASIX ) 20 MG tablet TAKE 1 TABLET BY MOUTH EVERY OTHER DAY *MAY TAKE EXTRA FOR WEIGHT GAIN OF 5LB IN 1 WEEK   gabapentin  (NEURONTIN ) 100 MG capsule Take 1 capsule (100 mg total) by mouth every morning AND 2 capsules (200 mg total) at bedtime.   irbesartan  (AVAPRO ) 150 MG tablet Take 1 tablet (150 mg total) by mouth daily.   Pitavastatin  Calcium  1 MG TABS Take 1 tablet (1 mg total) by mouth daily.   potassium chloride  (KLOR-CON ) 10 MEQ tablet Take 1 tablet (10 mEq total) by mouth  every other day.   acetaminophen  (TYLENOL ) 500 MG tablet Take 500 mg by mouth daily as needed for moderate pain or headache.   [DISCONTINUED] amoxicillin -clavulanate (AUGMENTIN ) 875-125 MG tablet Take 1 tablet by mouth 2 (two) times daily.   No facility-administered encounter medications on file as of 08/22/2023.    Allergies (verified) Azithromycin  and Codeine   History: Past Medical History:  Diagnosis Date   Allergy    Anginal pain (HCC)    Chronic anticoagulation, CHAD2Vasc score 4 02/09/2014   Chronic diastolic CHF (congestive heart failure) (HCC)    a. 01/2014 Echo: EF 50-55%, Gr1 DD, mild AI/MR.   Chronic  kidney disease    First degree atrioventricular block    GERD (gastroesophageal reflux disease)    Heart murmur    HTN (hypertension)    Hyperlipidemia    LBBB (left bundle branch block)    Osteoporosis    Osteopenia   PAF (paroxysmal atrial fibrillation) (HCC)    a. Previously on flecainide ;  b. 01/2014 sotalol  loaded and dccv performed;  c. Chronic Savaysa  Rx.   Presence of permanent cardiac pacemaker    a. 12/26/12 s/p MDT DC PPM.   SSS (sick sinus syndrome) (HCC)    a. s/p PPM-Medtronic placed 12/26/12    Symptomatic sinus bradycardia    Past Surgical History:  Procedure Laterality Date   APPENDECTOMY  1971   BREAST EXCISIONAL BIOPSY Right    CARDIOVERSION N/A 01/22/2014   Procedure: CARDIOVERSION;  Surgeon: Vinie KYM Maxcy, MD;  Location: [facility] ENDOSCOPY;  Service: Cardiovascular;  Laterality: N/A;   CARDIOVERSION N/A 04/10/2017   Procedure: CARDIOVERSION;  Surgeon: Jeffrie Oneil BROCKS, MD;  Location: [facility] ENDOSCOPY;  Service: Cardiovascular;  Laterality: N/A;   CARDIOVERSION N/A 10/23/2019   Procedure: CARDIOVERSION;  Surgeon: Francyne Headland, MD;  Location: [facility] ENDOSCOPY;  Service: Cardiovascular;  Laterality: N/A;   INSERT / REPLACE / REMOVE PACEMAKER  12/26/2012   Medtronic, Dr. Francyne   NM MYOCAR PERF WALL MOTION  08/05/2009   small to mod. reversible anteroseptal,septal,& apical perfusion defect. EF 59%.   PERMANENT PACEMAKER INSERTION N/A 12/26/2012   Procedure: PERMANENT PACEMAKER INSERTION;  Surgeon: Headland Francyne, MD;  Location: [facility] CATH LAB;  Service: Cardiovascular;  Laterality: N/A;   PPM GENERATOR CHANGEOUT N/A 03/21/2023   Procedure: PPM GENERATOR CHANGEOUT;  Surgeon: Francyne Headland, MD;  Location: [facility] INVASIVE CV LAB;  Service: Cardiovascular;  Laterality: N/A;   TONSILLECTOMY AND ADENOIDECTOMY  1960's   TUBAL LIGATION  1971   VAGINAL HYSTERECTOMY  1980's   Family History  Problem Relation Age of Onset   Heart attack Mother    Stroke Sister    Social History    Socioeconomic History   Marital status: Married    Spouse name: Glean   Number of children: 1   Years of education: Not on file   Highest education level: Not on file  Occupational History   Not on file  Tobacco Use   Smoking status: Never   Smokeless tobacco: Never  Vaping Use   Vaping status: Never Used  Substance and Sexual Activity   Alcohol use: No   Drug use: No   Sexual activity: Not Currently  Other Topics Concern   Not on file  Social History Narrative   ** Merged History Encounter **       Social Drivers of Health   Financial Resource Strain: Low Risk  (08/22/2023)   Overall Financial Resource Strain (CARDIA)    Difficulty of Paying Living Expenses: Not  hard at all  Food Insecurity: No Food Insecurity (08/22/2023)   Hunger Vital Sign    Worried About Running Out of Food in the Last Year: Never true    Ran Out of Food in the Last Year: Never true  Transportation Needs: No Transportation Needs (08/22/2023)   PRAPARE - Administrator, Civil Service (Medical): No    Lack of Transportation (Non-Medical): No  Physical Activity: Insufficiently Active (08/22/2023)   Exercise Vital Sign    Days of Exercise per Week: 5 days    Minutes of Exercise per Session: 10 min  Stress: No Stress Concern Present (08/22/2023)   [employer] of Occupational Health - Occupational Stress Questionnaire    Feeling of Stress: Not at all  Social Connections: Moderately Integrated (08/22/2023)   Social Connection and Isolation Panel    Frequency of Communication with Friends and Family: More than three times a week    Frequency of Social Gatherings with Friends and Family: More than three times a week    Attends Religious Services: More than 4 times per year    Active Member of [employer] or Organizations: No    Attends Engineer, structural: Never    Marital Status: Married    Tobacco Counseling Counseling given: Not Answered    Clinical Intake:  Pre-visit  preparation completed: Yes  Pain : No/denies pain     BMI - recorded: 28.72 Nutritional Status: BMI 25 -29 Overweight Nutritional Risks: None Diabetes: No  Lab Results  Component Value Date   HGBA1C 5.4 01/21/2014     How often do you need to have someone help you when you read instructions, pamphlets, or other written materials from your doctor or pharmacy?: 1 - Never  Interpreter Needed?: No  Information entered by :: Ellouise Haws, LPN   Activities of Daily Living     08/22/2023    9:14 AM  In your present state of health, do you have any difficulty performing the following activities:  Hearing? 1  Comment slight HOH  Vision? 0  Difficulty concentrating or making decisions? 0  Walking or climbing stairs? 0  Dressing or bathing? 0  Doing errands, shopping? 0  Preparing Food and eating ? N  Using the Toilet? N  In the past six months, have you accidently leaked urine? N  Do you have problems with loss of bowel control? N  Managing your Medications? N  Managing your Finances? N  Housekeeping or managing your Housekeeping? N    Patient Care Team: Wendolyn Jenkins Jansky, MD as PCP - General (Family Medicine) Croitoru, Jerel, MD as PCP - Cardiology (Cardiology)  I have updated your Care Teams any recent Medical Services you may have received from other providers in the past year.     Assessment:   This is a routine wellness examination for [place].  Hearing/Vision screen Hearing Screening - Comments:: Pt stated slight HOH  Vision Screening - Comments:: Wears rx glasses - up to date with routine eye exams with Dr Norleen Molt [facility]    Goals Addressed             This Visit's Progress    Patient Stated       Maintain health and activity        Depression Screen     08/22/2023    9:18 AM 07/16/2023   10:21 AM 11/16/2019    1:09 PM  PHQ 2/9 Scores  PHQ - 2 Score 0 0 0  Fall Risk     08/22/2023    9:20 AM 07/16/2023   10:21 AM  Fall Risk   Falls  in the past year? 0 0  Number falls in past yr: 0 0  Injury with Fall? 0 0  Risk for fall due to : No Fall Risks No Fall Risks  Follow up Falls prevention discussed     MEDICARE RISK AT HOME:  Medicare Risk at Home Any stairs in or around the home?: Yes If so, are there any without handrails?: No Home free of loose throw rugs in walkways, pet beds, electrical cords, etc?: Yes Adequate lighting in your home to reduce risk of falls?: Yes Life alert?: No Use of a cane, walker or w/c?: No Grab bars in the bathroom?: Yes Shower chair or bench in shower?: No Elevated toilet seat or a handicapped toilet?: No  TIMED UP AND GO:  Was the test performed?  No  Cognitive Function: 6CIT completed        08/22/2023    9:20 AM  6CIT Screen  What Year? 0 points  What month? 0 points  What time? 0 points  Count back from 20 0 points  Months in reverse 0 points  Repeat phrase 0 points  Total Score 0 points    Immunizations Immunization History  Administered Date(s) Administered   Fluad  Trivalent(High Dose 65+) 09/21/2022   Influenza, High Dose Seasonal PF 09/15/2016, 10/05/2017, 08/23/2018, 09/30/2019   Influenza,inj,quad, With Preservative 09/17/2016   Influenza-Unspecified 10/08/2013, 09/19/2020   PFIZER Comirnaty (Gray Top)Covid-19 Tri-Sucrose Vaccine 05/03/2020   PFIZER(Purple Top)SARS-COV-2 Vaccination 01/29/2019, 02/19/2019, 11/07/2019   Pfizer Covid-19 Vaccine Bivalent Booster 54yrs & up 09/19/2020, 07/26/2021   Pfizer(Comirnaty )Fall Seasonal Vaccine 12 years and older 12/21/2021, 09/21/2022   Pneumococcal Conjugate Pcv21, Polysaccharide Crm197 Conjugaf 03/05/2023   Pneumococcal Conjugate-13 11/16/2019   Pneumococcal Polysaccharide-23 01/09/2007   Tdap 10/30/2021   Zoster Recombinant(Shingrix) 08/26/2018, 07/08/2019    Screening Tests Health Maintenance  Topic Date Due   COVID-19 Vaccine (9 - Pfizer risk 2024-25 season) 03/21/2023   INFLUENZA VACCINE  08/09/2023    Medicare Annual Wellness (AWV)  08/21/2024   DTaP/Tdap/Td (2 - Td or Tdap) 10/31/2031   Pneumococcal Vaccine: 50+ Years  Completed   DEXA SCAN  Completed   Zoster Vaccines- Shingrix  Completed   HPV VACCINES  Aged Out   Meningococcal B Vaccine  Aged Out    Health Maintenance  Health Maintenance Due  Topic Date Due   COVID-19 Vaccine (9 - Pfizer risk 2024-25 season) 03/21/2023   INFLUENZA VACCINE  08/09/2023   Health Maintenance Items Addressed: See Nurse Notes at the end of this note  Additional Screening:  Vision Screening: Recommended annual ophthalmology exams for early detection of glaucoma and other disorders of the eye. Would you like a referral to an eye doctor? No    Dental Screening: Recommended annual dental exams for proper oral hygiene  Community Resource Referral / Chronic Care Management: CRR required this visit?  No   CCM required this visit?  No   Plan:    I have personally reviewed and noted the following in the patient's chart:   Medical and social history Use of alcohol, tobacco or illicit drugs  Current medications and supplements including opioid prescriptions. Patient is not currently taking opioid prescriptions. Functional ability and status Nutritional status Physical activity Advanced directives List of other physicians Hospitalizations, surgeries, and ER visits in previous 12 months Vitals Screenings to include cognitive, depression, and falls Referrals and appointments  In addition, I have reviewed and discussed with patient certain preventive protocols, quality metrics, and best practice recommendations. A written personalized care plan for preventive services as well as general preventive health recommendations were provided to patient.   Ellouise VEAR Haws, LPN   [id number]   After Visit Summary: (MyChart) Due to this being a telephonic visit, the after visit summary with patients personalized plan was offered to patient via MyChart    Notes: Nothing significant to report at this time.

## 2023-09-05 NOTE — Addendum Note (Signed)
 Addended by: VICCI SELLER A on: 09/05/2023 08:58 AM   Modules accepted: Orders

## 2023-09-05 NOTE — Progress Notes (Signed)
 Remote pacemaker transmission.

## 2023-09-12 ENCOUNTER — Encounter: Payer: Self-pay | Admitting: Cardiovascular Disease

## 2023-09-12 ENCOUNTER — Ambulatory Visit: Attending: Cardiovascular Disease | Admitting: Cardiovascular Disease

## 2023-09-12 VITALS — BP 139/76 | HR 65 | Ht 62.0 in | Wt 157.4 lb

## 2023-09-12 DIAGNOSIS — I700 Atherosclerosis of aorta: Secondary | ICD-10-CM | POA: Insufficient documentation

## 2023-09-12 DIAGNOSIS — E7800 Pure hypercholesterolemia, unspecified: Secondary | ICD-10-CM | POA: Insufficient documentation

## 2023-09-12 DIAGNOSIS — I10 Essential (primary) hypertension: Secondary | ICD-10-CM | POA: Diagnosis present

## 2023-09-12 DIAGNOSIS — D6869 Other thrombophilia: Secondary | ICD-10-CM | POA: Insufficient documentation

## 2023-09-12 DIAGNOSIS — Z950 Presence of cardiac pacemaker: Secondary | ICD-10-CM | POA: Diagnosis present

## 2023-09-12 DIAGNOSIS — R0602 Shortness of breath: Secondary | ICD-10-CM | POA: Diagnosis present

## 2023-09-12 DIAGNOSIS — I4819 Other persistent atrial fibrillation: Secondary | ICD-10-CM | POA: Insufficient documentation

## 2023-09-12 DIAGNOSIS — I495 Sick sinus syndrome: Secondary | ICD-10-CM | POA: Insufficient documentation

## 2023-09-12 DIAGNOSIS — Z79899 Other long term (current) drug therapy: Secondary | ICD-10-CM | POA: Insufficient documentation

## 2023-09-12 DIAGNOSIS — I5032 Chronic diastolic (congestive) heart failure: Secondary | ICD-10-CM | POA: Diagnosis present

## 2023-09-12 NOTE — Progress Notes (Signed)
 Cardiology office note    Date:  09/12/2023   ID:  Monica Harvey, DOB Sep 12, 1940, MRN [id number]  PCP:  Wendolyn Jenkins Jansky, MD  Cardiologist:  Jerel Balding, MD  Electrophysiologist:  None   Evaluation Performed:  Follow-Up Visit  Chief Complaint: Pacemaker check/atrial fibrillation  History of Present Illness:    Monica Harvey is a 83 y.o. female with longstanding tachycardia-bradycardia syndrome, dual-chamber permanent pacemaker (Medtronic Azure, generator change out March 2025, 5076 atrial and ventricular leads), recurrent episodes of persistent atrial fibrillation with previous history of heart failure decompensation due to tachycardia related cardiomyopathy, currently on amiodarone , chronic left bundle branch block, aortic atherosclerosis.  Her arrhythmia was well-controlled on sotalol  for years, but after having peaked breakthrough events she was switched to amiodarone  about 3 to 4 years ago.  She has not had any significant recurrent atrial fibrillation since cardioversion October 23, 2019.   Her biggest complaint today is of fatigue.  She tires very easily but does not have dyspnea and denies dizziness or syncope.  Is not describing heat or cold intolerance, but complains that her hair is very brittle and thinning.  She had normal TSH in August 2024, but has not been rechecked since.  Denies chest pain, claudication, lower extremity edema, orthopnea, PND.  She has well-controlled neuropathy in her lower extremities.  She has not had any other focal neurological complaints. She does not describe falls or any bleeding problems on Eliquis .   Pacemaker interrogation shows normal device function.  Presenting rhythm is atrial paced, ventricular sensed.  She has 99.5% atrial pacing (atrially dependent) but never requires ventricular pacing.  Lead parameters are excellent.  Activity level has been very steady since device implantation in March at roughly 3.5 hours a day.  Heart rate  histogram distribution appears very appropriate.  She has not had any atrial fibrillation since the generator change at and has only had 8 minutes of atrial fibrillation in the time since her last cardioversion.  Her most recent echocardiogram from October 2020 shows near normalization of left ventricular systolic function with an EF that is now 50-55%.  Subtle reduction in EF is due to LBBB related dyssynchrony.  EF has improved compared to July 2019 (EF 45-50%), further improved on echo October 2020 (EF 50-55%).  Diastolic function parameters suggest normal left ventricular filling pressures.    Past Medical History:  Diagnosis Date   Allergy    Anginal pain (HCC)    Chronic anticoagulation, CHAD2Vasc score 4 02/09/2014   Chronic diastolic CHF (congestive heart failure) (HCC)    a. 01/2014 Echo: EF 50-55%, Gr1 DD, mild AI/MR.   Chronic kidney disease    First degree atrioventricular block    GERD (gastroesophageal reflux disease)    Heart murmur    HTN (hypertension)    Hyperlipidemia    LBBB (left bundle branch block)    Osteoporosis    Osteopenia   PAF (paroxysmal atrial fibrillation) (HCC)    a. Previously on flecainide ;  b. 01/2014 sotalol  loaded and dccv performed;  c. Chronic Savaysa  Rx.   Presence of permanent cardiac pacemaker    a. 12/26/12 s/p MDT DC PPM.   SSS (sick sinus syndrome) (HCC)    a. s/p PPM-Medtronic placed 12/26/12    Symptomatic sinus bradycardia    Past Surgical History:  Procedure Laterality Date   APPENDECTOMY  1971   BREAST EXCISIONAL BIOPSY Right    CARDIOVERSION N/A 01/22/2014   Procedure: CARDIOVERSION;  Surgeon: Vinie KYM Maxcy,  MD;  Location: [facility] ENDOSCOPY;  Service: Cardiovascular;  Laterality: N/A;   CARDIOVERSION N/A 04/10/2017   Procedure: CARDIOVERSION;  Surgeon: Jeffrie Oneil BROCKS, MD;  Location: [facility] ENDOSCOPY;  Service: Cardiovascular;  Laterality: N/A;   CARDIOVERSION N/A 10/23/2019   Procedure: CARDIOVERSION;  Surgeon: Francyne Headland, MD;   Location: [facility] ENDOSCOPY;  Service: Cardiovascular;  Laterality: N/A;   INSERT / REPLACE / REMOVE PACEMAKER  12/26/2012   Medtronic, Dr. Francyne   NM MYOCAR PERF WALL MOTION  08/05/2009   small to mod. reversible anteroseptal,septal,& apical perfusion defect. EF 59%.   PERMANENT PACEMAKER INSERTION N/A 12/26/2012   Procedure: PERMANENT PACEMAKER INSERTION;  Surgeon: Headland Francyne, MD;  Location: [facility] CATH LAB;  Service: Cardiovascular;  Laterality: N/A;   PPM GENERATOR CHANGEOUT N/A 03/21/2023   Procedure: PPM GENERATOR CHANGEOUT;  Surgeon: Francyne Headland, MD;  Location: [facility] INVASIVE CV LAB;  Service: Cardiovascular;  Laterality: N/A;   TONSILLECTOMY AND ADENOIDECTOMY  1960's   TUBAL LIGATION  1971   VAGINAL HYSTERECTOMY  1980's     Current Meds  Medication Sig   acetaminophen  (TYLENOL ) 500 MG tablet Take 500 mg by mouth daily as needed for moderate pain or headache.   amiodarone  (PACERONE ) 100 MG tablet Take 100 mg by mouth daily.   benzonatate  (TESSALON ) 200 MG capsule Take 1 capsule (200 mg total) by mouth 2 (two) times daily as needed for cough.   diltiazem  (CARDIZEM  CD) 120 MG 24 hr capsule Take 1 capsule (120 mg total) by mouth daily.   ELIQUIS  2.5 MG TABS tablet TAKE 1 TABLET TWICE A DAY   furosemide  (LASIX ) 20 MG tablet TAKE 1 TABLET BY MOUTH EVERY OTHER DAY *MAY TAKE EXTRA FOR WEIGHT GAIN OF 5LB IN 1 WEEK   gabapentin  (NEURONTIN ) 100 MG capsule Take 1 capsule (100 mg total) by mouth every morning AND 2 capsules (200 mg total) at bedtime.   irbesartan  (AVAPRO ) 150 MG tablet Take 1 tablet (150 mg total) by mouth daily.   Pitavastatin  Calcium  1 MG TABS Take 1 tablet (1 mg total) by mouth daily.   potassium chloride  (KLOR-CON ) 10 MEQ tablet Take 1 tablet (10 mEq total) by mouth every other day.     Allergies:   Azithromycin  and Codeine   Social History   Tobacco Use   Smoking status: Never   Smokeless tobacco: Never  Vaping Use   Vaping status: Never Used  Substance Use Topics    Alcohol use: No   Drug use: No     Family Hx: The patient's family history includes Heart attack in her mother; Stroke in her sister.  ROS:   Please see the history of present illness.    All other systems are reviewed and are negative.  Prior CV studies:   The following studies were reviewed today:  Comprehensive pacemaker check today.  See above.  Echocardiogram November 06, 2018   1. Left ventricular ejection fraction, by visual estimation, is 50 to 55%. The left ventricle has normal function. There is mildly increased left ventricular hypertrophy.  2. Left ventricular diastolic parameters are consistent with Grade I diastolic dysfunction (impaired relaxation).  3. Global right ventricle has normal systolic function.The right ventricular size is normal. No increase in right ventricular wall thickness.  4. Left atrial size was normal.  5. Right atrial size was normal.  6. The mitral valve is normal in structure. Trace mitral valve regurgitation.  7. The tricuspid valve is grossly normal. Tricuspid valve regurgitation is mild.  8. Aortic valve  regurgitation is mild to moderate.  9. The aortic valve is normal in structure. Aortic valve regurgitation is mild to moderate. No evidence of aortic valve sclerosis or stenosis. 10. The pulmonic valve was grossly normal. Pulmonic valve regurgitation is trivial. 11. Aortic dilatation noted. 12. There is mild to moderate dilatation of the ascending aorta measuring 42 mm. 13. Normal pulmonary artery systolic pressure. 14. A pacer wire is visualized. 15. The atrial septum is grossly normal.  Labs/Other Tests and Data Reviewed:    EKG:  ECG performed today shows atrial paced, ventricular sensed rhythm, prolonged AV delay, LBBB. Recent Labs: 02/26/2023: BUN 26; Creatinine, Ser 1.81; Hemoglobin 13.3; Platelets 283; Potassium 4.7; Sodium 139   Recent Lipid Panel Lab Results  Component Value Date/Time   CHOL 195 08/23/2022 08:47 AM   TRIG  98 08/23/2022 08:47 AM   HDL 70 08/23/2022 08:47 AM   CHOLHDL 2.8 08/23/2022 08:47 AM   CHOLHDL 2.5 09/01/2015 09:54 AM   LDLCALC 108 (H) 08/23/2022 08:47 AM    Wt Readings from Last 3 Encounters:  09/12/23 157 lb 6.4 oz (71.4 kg)  08/22/23 157 lb (71.2 kg)  07/16/23 157 lb 3.2 oz (71.3 kg)     Objective:    Vital Signs:  BP 139/76   Pulse 65   Ht 5' 2 (1.575 m)   Wt 157 lb 6.4 oz (71.4 kg)   SpO2 96%   BMI 28.79 kg/m      General: Alert, oriented x3, no distress, left subclavian pacemaker site. Head: no evidence of trauma, PERRL, EOMI, no exophtalmos or lid lag, no myxedema, no xanthelasma; normal ears, nose and oropharynx Neck: normal jugular venous pulsations and no hepatojugular reflux; brisk carotid pulses without delay and no carotid bruits.  She does not have evidence of goiter. Chest: clear to auscultation, no signs of consolidation by percussion or palpation, normal fremitus, symmetrical and full respiratory excursions Cardiovascular: normal position and quality of the apical impulse, regular rhythm, normal first and paradoxically split second heart sounds, no murmurs, rubs or gallops Abdomen: no tenderness or distention, no masses by palpation, no abnormal pulsatility or arterial bruits, normal bowel sounds, no hepatosplenomegaly Extremities: no clubbing, cyanosis or edema; 2+ radial, ulnar and brachial pulses bilaterally; 2+ right femoral, posterior tibial and dorsalis pedis pulses; 2+ left femoral, posterior tibial and dorsalis pedis pulses; no subclavian or femoral bruits Neurological: grossly nonfocal Psych: Normal mood and affect     ASSESSMENT & PLAN:    1. Chronic diastolic heart failure (HCC)   2. Persistent atrial fibrillation (HCC)   3. Acquired thrombophilia (HCC)   4. Pacemaker   5. SSS (sick sinus syndrome) (HCC)   6. Essential hypertension   7. On amiodarone  therapy   8. Pure hypercholesterolemia   9. Atherosclerosis of aorta (HCC)   10.  Shortness of breath       CHF: He has fatigue but she does not have dyspnea and appears clinically euvolemic.  She has a history of rapid deterioration when she develops persistent atrial fibrillation,  mostly due to tachycardia related cardiomyopathy and due to increased right ventricular pacing.  She has not required any diuretic dose adjustment since her cardioversion almost 4 years ago.  If active been able to decrease the dose of furosemide . AFib: Very well-controlled since we started amiodarone .  Has not recurred since we started amiodarone , even though she is only taking 100 mg daily.  In the past when it was persistent, the atrial fibrillation was very poorly tolerated.  CHADSVasc 5-6 (age 73, gender, HTN, CHF, possible CAD).  On anticoagulation. Anticoagulation: Well-tolerated without bleeding problems. PM: Normal device function.  She is atrially pacemaker dependent, with normal heart rate histogram distribution.  SSS: She does not have any meaningful underlying atrial rhythm other than occasional PACs.  She is atrially pacemaker dependent.   Cannot blame the rhythm for her fatigue. Hypertension: Well-controlled Amiodarone : Will recheck her thyroid function tests and liver tests.  Amiodarone -related  hyperthyroidism could explain her fatigue.  We have previously discussed referral to EP for ablation, but at age 77 she prefers a conservative approach.  Neuropathy symptoms may be related to amiodarone , but are reasonably well-controlled on gabapentin .   HLP: LDL target is less than 100 (no known CAD or PAD).  Most recent labs showed LDL 108.  Recheck today. Aortic atherosclerosis incidentally seen on imaging studies for the thoracic spine, normal caliber    Medication Adjustments/Labs and Tests Ordered: Current medicines are reviewed at length with the patient today.  Concerns regarding medicines are outlined above.   Tests Ordered: Orders Placed This Encounter  Procedures   TSH  Rfx on Abnormal to Free T4   Comprehensive metabolic panel with GFR   Lipid panel   Brain natriuretic peptide   EKG 12-Lead   ECHOCARDIOGRAM COMPLETE    Medication Changes: No orders of the defined types were placed in this encounter.  Patient Instructions  Medication Instructions:  No changes *If you need a refill on your cardiac medications before your next appointment, please call your pharmacy*  Lab Work: Lipid panel, CMP, BNP, TSH with reflex T4 If you have labs (blood work) drawn today and your tests are completely normal, you will receive your results only by: MyChart Message (if you have MyChart) OR A paper copy in the mail If you have any lab test that is abnormal or we need to change your treatment, we will call you to review the results.  Testing/Procedures: Your physician has requested that you have an echocardiogram. Echocardiography is a painless test that uses sound waves to create images of your heart. It provides your doctor with information about the size and shape of your heart and how well your heart's chambers and valves are working. This procedure takes approximately one hour. There are no restrictions for this procedure. Please do NOT wear cologne, perfume, aftershave, or lotions (deodorant is allowed). Please arrive 15 minutes prior to your appointment time.  Please note: We ask at that you not bring children with you during ultrasound (echo/ vascular) testing. Due to room size and safety concerns, children are not allowed in the ultrasound rooms during exams. Our front office staff cannot provide observation of children in our lobby area while testing is being conducted. An adult accompanying a patient to their appointment will only be allowed in the ultrasound room at the discretion of the ultrasound technician under special circumstances. We apologize for any inconvenience.   Follow-Up: At [facility], you and your health needs are our priority.  As  part of our continuing mission to provide you with exceptional heart care, our providers are all part of one team.  This team includes your primary Cardiologist (physician) and Advanced Practice Providers or APPs (Physician Assistants and Nurse Practitioners) who all work together to provide you with the care you need, when you need it.  Your next appointment:   1 year(s)  Provider:   Jerel Balding, MD    We recommend signing up for the patient portal  called MyChart.  Sign up information is provided on this After Visit Summary.  MyChart is used to connect with patients for Virtual Visits (Telemedicine).  Patients are able to view lab/test results, encounter notes, upcoming appointments, etc.  Non-urgent messages can be sent to your provider as well.   To learn more about what you can do with MyChart, go to [email or web address].          Disposition:  Follow up 3 months  Signed, Jerel Balding, MD  09/12/2023 4:21 PM    [facility]

## 2023-09-12 NOTE — Patient Instructions (Signed)
 Medication Instructions:  No changes *If you need a refill on your cardiac medications before your next appointment, please call your pharmacy*  Lab Work: Lipid panel, CMP, BNP, TSH with reflex T4 If you have labs (blood work) drawn today and your tests are completely normal, you will receive your results only by: MyChart Message (if you have MyChart) OR A paper copy in the mail If you have any lab test that is abnormal or we need to change your treatment, we will call you to review the results.  Testing/Procedures: Your physician has requested that you have an echocardiogram. Echocardiography is a painless test that uses sound waves to create images of your heart. It provides your doctor with information about the size and shape of your heart and how well your heart's chambers and valves are working. This procedure takes approximately one hour. There are no restrictions for this procedure. Please do NOT wear cologne, perfume, aftershave, or lotions (deodorant is allowed). Please arrive 15 minutes prior to your appointment time.  Please note: We ask at that you not bring children with you during ultrasound (echo/ vascular) testing. Due to room size and safety concerns, children are not allowed in the ultrasound rooms during exams. Our front office staff cannot provide observation of children in our lobby area while testing is being conducted. An adult accompanying a patient to their appointment will only be allowed in the ultrasound room at the discretion of the ultrasound technician under special circumstances. We apologize for any inconvenience.   Follow-Up: At [facility], you and your health needs are our priority.  As part of our continuing mission to provide you with exceptional heart care, our providers are all part of one team.  This team includes your primary Cardiologist (physician) and Advanced Practice Providers or APPs (Physician Assistants and Nurse Practitioners) who all  work together to provide you with the care you need, when you need it.  Your next appointment:   1 year(s)  Provider:   Jerel Balding, MD    We recommend signing up for the patient portal called MyChart.  Sign up information is provided on this After Visit Summary.  MyChart is used to connect with patients for Virtual Visits (Telemedicine).  Patients are able to view lab/test results, encounter notes, upcoming appointments, etc.  Non-urgent messages can be sent to your provider as well.   To learn more about what you can do with MyChart, go to [email or web address].

## 2023-09-13 ENCOUNTER — Ambulatory Visit: Payer: Self-pay | Admitting: Cardiovascular Disease

## 2023-09-13 LAB — COMPREHENSIVE METABOLIC PANEL WITH GFR
ALT: 14 IU/L (ref 0–32)
AST: 17 IU/L (ref 0–40)
Albumin: 4.5 g/dL (ref 3.7–4.7)
Alkaline Phosphatase: 194 IU/L — ABNORMAL HIGH (ref 44–121)
BUN/Creatinine Ratio: 16 (ref 12–28)
BUN: 28 mg/dL — ABNORMAL HIGH (ref 8–27)
Bilirubin Total: 0.2 mg/dL (ref 0.0–1.2)
CO2: 20 mmol/L (ref 20–29)
Calcium: 10 mg/dL (ref 8.7–10.3)
Chloride: 103 mmol/L (ref 96–106)
Creatinine, Ser: 1.72 mg/dL — ABNORMAL HIGH (ref 0.57–1.00)
Globulin, Total: 2.3 g/dL (ref 1.5–4.5)
Glucose: 85 mg/dL (ref 70–99)
Potassium: 4.7 mmol/L (ref 3.5–5.2)
Sodium: 142 mmol/L (ref 134–144)
Total Protein: 6.8 g/dL (ref 6.0–8.5)
eGFR: 30 mL/min/1.73 — ABNORMAL LOW (ref >59)

## 2023-09-13 LAB — LIPID PANEL
Chol/HDL Ratio: 2.8 ratio (ref 0.0–4.4)
Cholesterol, Total: 188 mg/dL (ref 100–199)
HDL: 66 mg/dL (ref >39)
LDL Chol Calc (NIH): 91 mg/dL (ref 0–99)
Triglycerides: 187 mg/dL — ABNORMAL HIGH (ref 0–149)
VLDL Cholesterol Cal: 31 mg/dL (ref 5–40)

## 2023-09-13 LAB — TSH RFX ON ABNORMAL TO FREE T4: TSH: 2.57 u[IU]/mL (ref 0.450–4.500)

## 2023-09-13 LAB — BRAIN NATRIURETIC PEPTIDE: BNP: 47.5 pg/mL (ref 0.0–100.0)

## 2023-09-18 ENCOUNTER — Telehealth: Payer: Self-pay | Admitting: *Deleted

## 2023-09-18 NOTE — Telephone Encounter (Signed)
 Copied from CRM #[id number]. Topic: Clinical - Medication Question >> Sep 17, 2023 10:00 AM Drema MATSU wrote: Reason for CRM: Patient stated that [facility] on [facility] is needing a covid vaccine prescription.

## 2023-09-18 NOTE — Telephone Encounter (Signed)
No PCP 

## 2023-09-23 ENCOUNTER — Other Ambulatory Visit (HOSPITAL_BASED_OUTPATIENT_CLINIC_OR_DEPARTMENT_OTHER): Payer: Self-pay

## 2023-09-23 MED ORDER — COMIRNATY 30 MCG/0.3ML IM SUSY
0.3000 mL | PREFILLED_SYRINGE | Freq: Once | INTRAMUSCULAR | 0 refills | Status: AC
  Filled 2023-09-23: qty 0.3, 1d supply, fill #0

## 2023-09-24 IMAGING — MG MM DIGITAL DIAGNOSTIC UNILAT*L* W/ TOMO W/ CAD
6 series · 6 of 18 positions shown · non-contrast
Comparison: Previous exam(s).

CLINICAL DATA: 80-year-old female recalled from screening mammogram
dated 05/30/2021 for a possible left breast asymmetry.

EXAM:
DIGITAL DIAGNOSTIC UNILATERAL LEFT MAMMOGRAM WITH TOMOSYNTHESIS AND
CAD
TECHNIQUE: Left digital diagnostic mammography and breast tomosynthesis was
performed. The images were evaluated with computer-aided detection.

[L ML synth-2D]
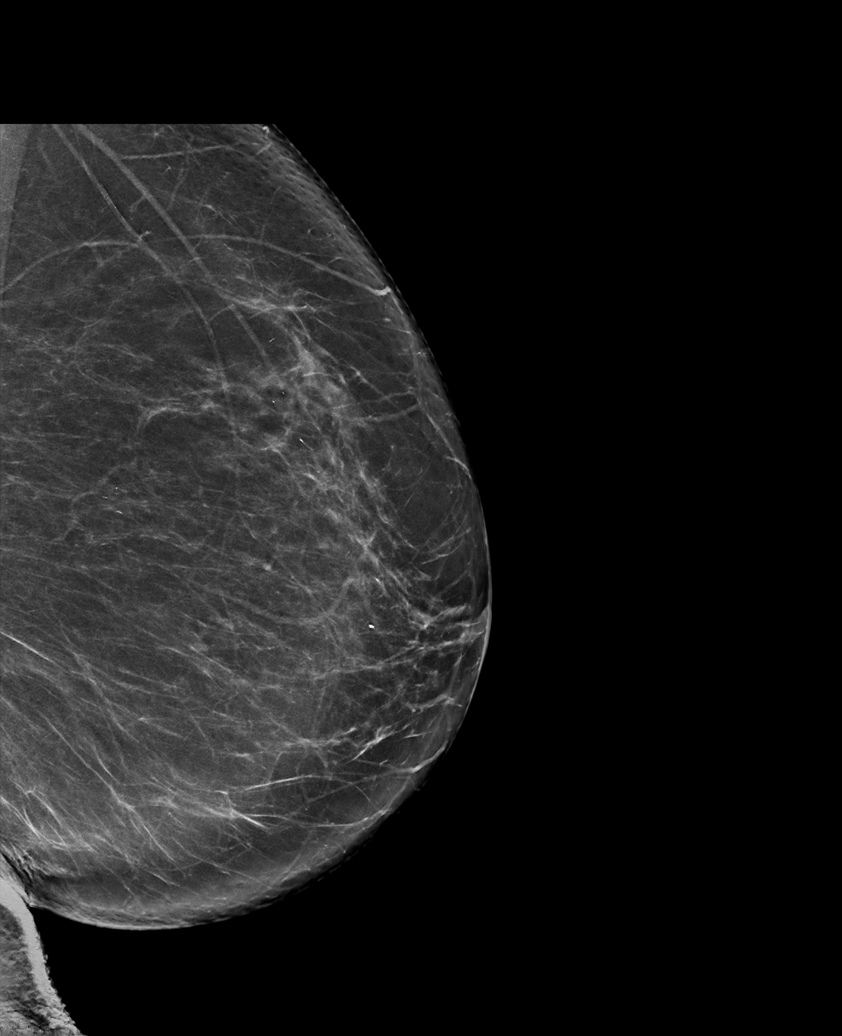

[L CC synth-2D]
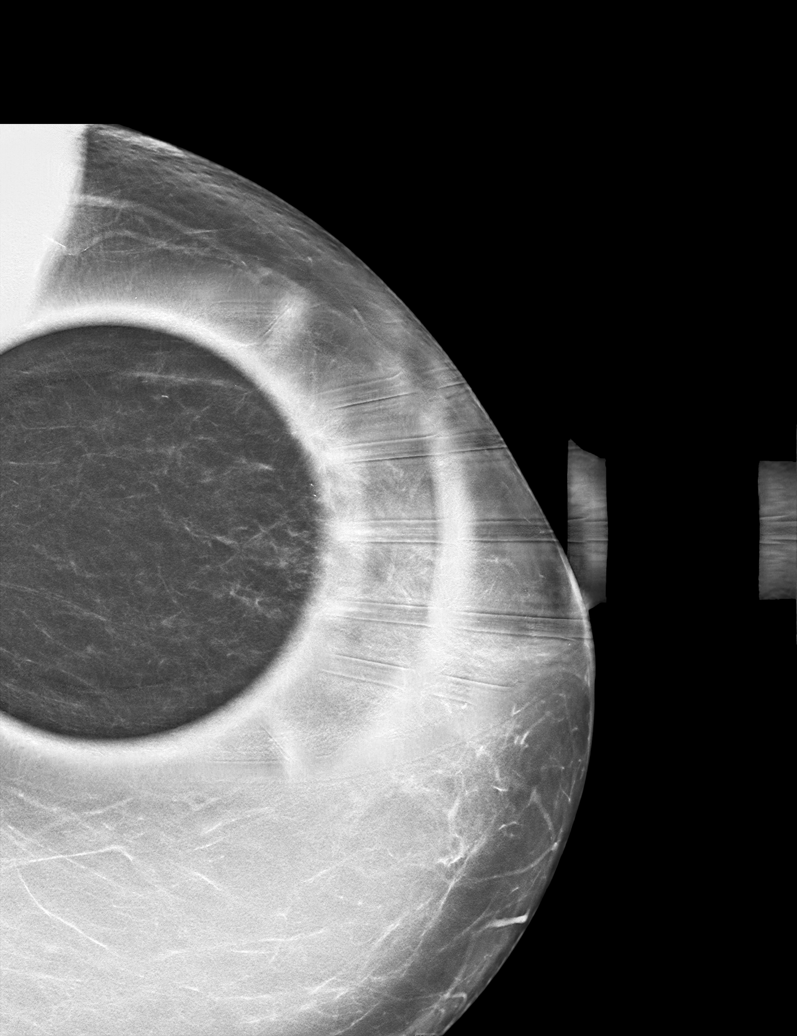

[L XCCL synth-2D]
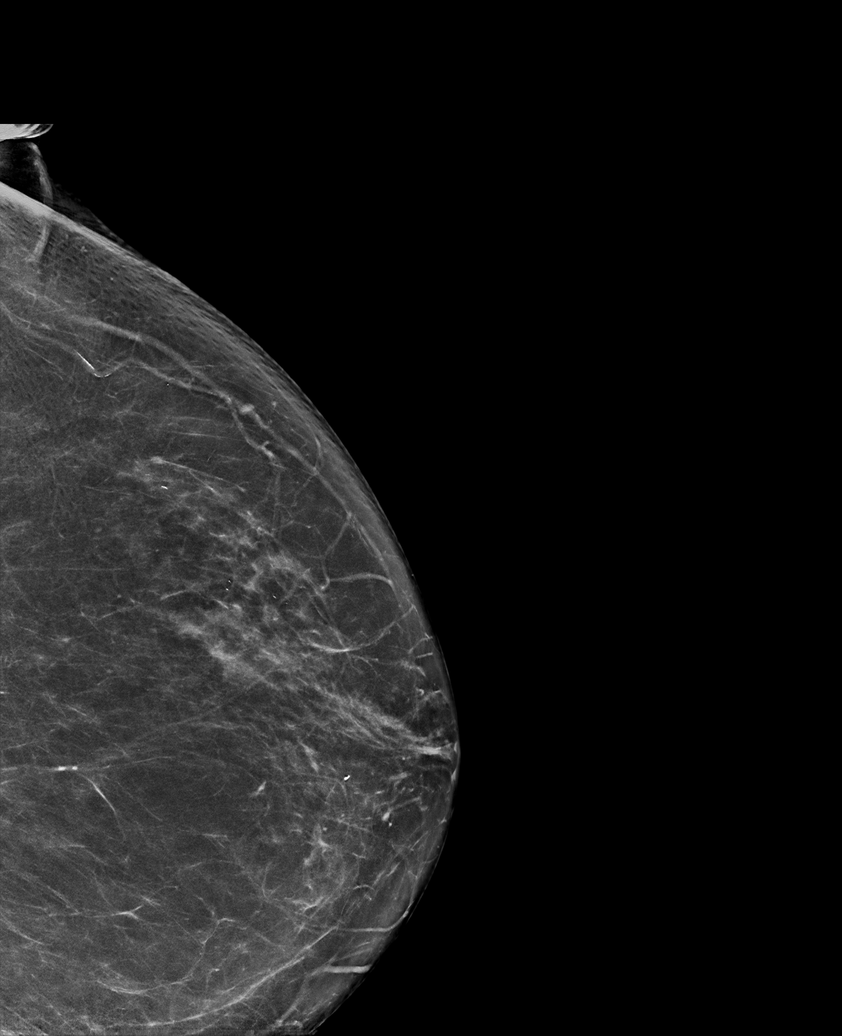

[L XCCL tomo · tomo slice 42/83.0]
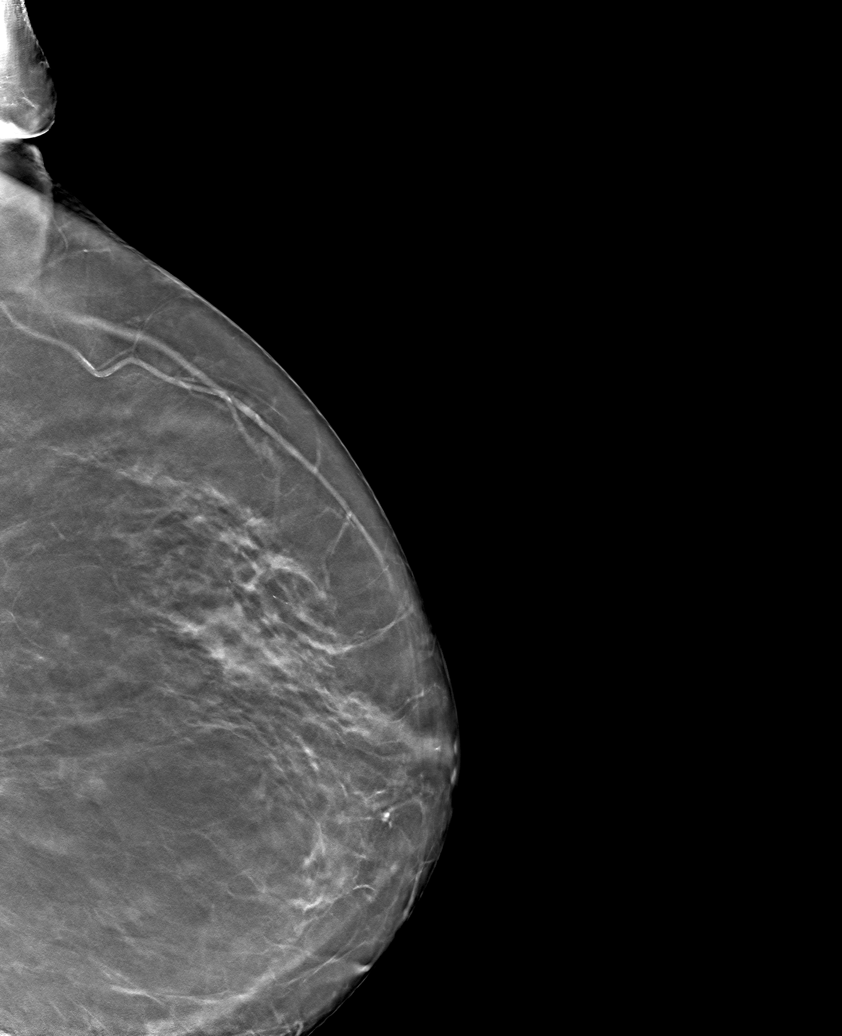

[L ML tomo · tomo slice 43/84.0]
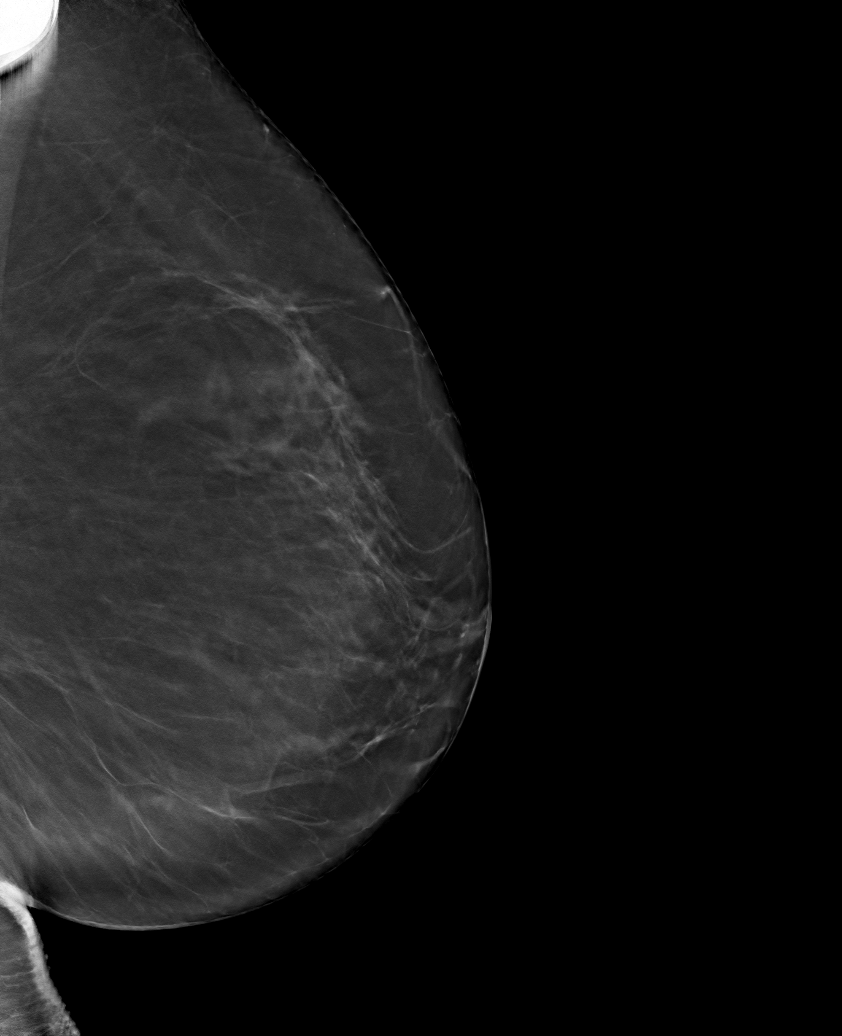

[L CC tomo · tomo slice 36/71.0]
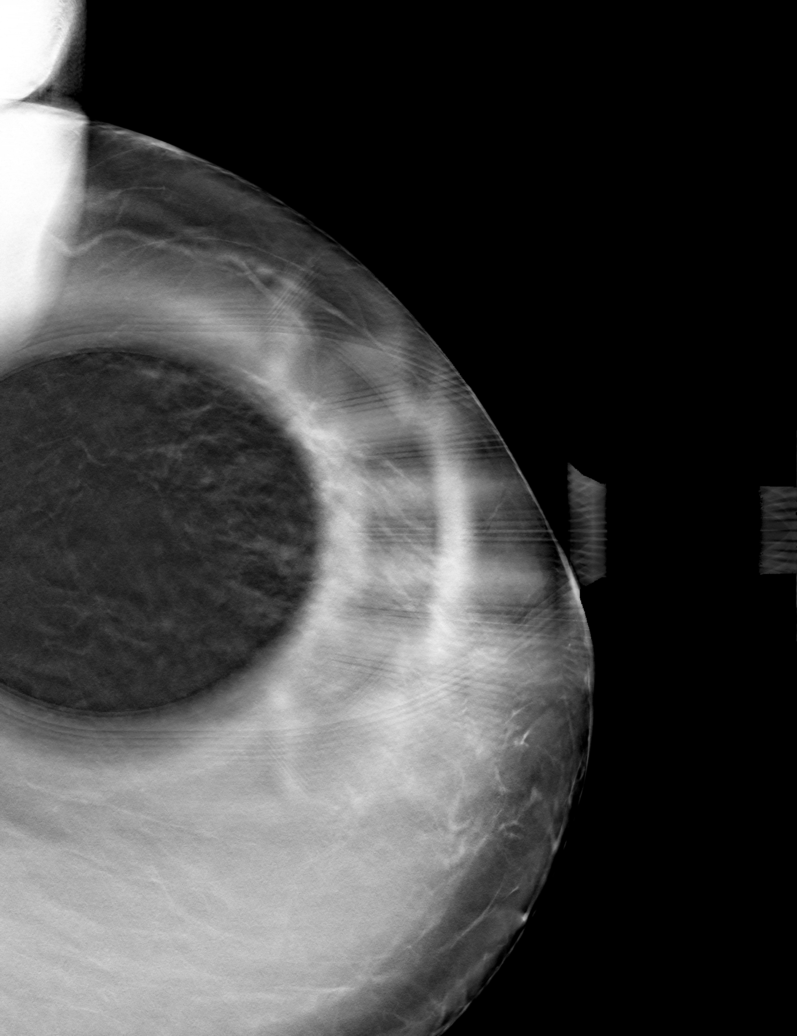

[6 of 18 positions shown; findings below may reference images not displayed]

ACR Breast Density Category b: There are scattered areas of
fibroglandular density.
FINDINGS: Previously described, possible asymmetry in the lateral left breast
at posterior depth seen on the cc projection only resolves into well
dispersed fibroglandular tissue on additional views. No suspicious
findings identified.
IMPRESSION: No mammographic evidence of malignancy.

RECOMMENDATION:
Screening mammogram in one year.(Code:Y2-D-YQC)

I have discussed the findings and recommendations with the patient.
If applicable, a reminder letter will be sent to the patient
regarding the next appointment.

BI-RADS CATEGORY  1: Negative.

## 2023-09-25 ENCOUNTER — Ambulatory Visit (INDEPENDENT_AMBULATORY_CARE_PROVIDER_SITE_OTHER)

## 2023-09-25 DIAGNOSIS — I495 Sick sinus syndrome: Secondary | ICD-10-CM | POA: Diagnosis not present

## 2023-09-26 ENCOUNTER — Ambulatory Visit: Payer: Self-pay | Admitting: Cardiovascular Disease

## 2023-09-26 LAB — CUP PACEART REMOTE DEVICE CHECK
Battery Remaining Longevity: 154 mo
Battery Voltage: 3.18 V
Brady Statistic AP VP Percent: 0.04 %
Brady Statistic AP VS Percent: 99.45 %
Brady Statistic AS VP Percent: 0 %
Brady Statistic AS VS Percent: 0.51 %
Brady Statistic RA Percent Paced: 99.49 %
Brady Statistic RV Percent Paced: 0.04 %
Date Time Interrogation Session: 20250917031357
Implantable Lead Connection Status: 753985
Implantable Lead Connection Status: 753985
Implantable Lead Implant Date: 20141219
Implantable Lead Implant Date: 20141219
Implantable Lead Location: 753859
Implantable Lead Location: 753860
Implantable Lead Model: 5076
Implantable Lead Model: 5076
Implantable Pulse Generator Implant Date: 20250313
Lead Channel Impedance Value: 323 Ohm
Lead Channel Impedance Value: 342 Ohm
Lead Channel Impedance Value: 380 Ohm
Lead Channel Impedance Value: 399 Ohm
Lead Channel Pacing Threshold Amplitude: 0.75 V
Lead Channel Pacing Threshold Amplitude: 1.375 V
Lead Channel Pacing Threshold Pulse Width: 0.4 ms
Lead Channel Pacing Threshold Pulse Width: 0.4 ms
Lead Channel Sensing Intrinsic Amplitude: 4 mV
Lead Channel Sensing Intrinsic Amplitude: 4 mV
Lead Channel Sensing Intrinsic Amplitude: 5.75 mV
Lead Channel Sensing Intrinsic Amplitude: 5.75 mV
Lead Channel Setting Pacing Amplitude: 1.5 V
Lead Channel Setting Pacing Amplitude: 3 V
Lead Channel Setting Pacing Pulse Width: 0.4 ms
Lead Channel Setting Sensing Sensitivity: 1.2 mV
Zone Setting Status: 755011

## 2023-09-30 NOTE — Progress Notes (Signed)
 Remote PPM Transmission

## 2023-10-03 DIAGNOSIS — M545 Low back pain, unspecified: Secondary | ICD-10-CM | POA: Insufficient documentation

## 2023-10-04 ENCOUNTER — Other Ambulatory Visit (HOSPITAL_BASED_OUTPATIENT_CLINIC_OR_DEPARTMENT_OTHER): Payer: Self-pay

## 2023-10-04 DIAGNOSIS — M25551 Pain in right hip: Secondary | ICD-10-CM | POA: Insufficient documentation

## 2023-10-04 MED ORDER — FLUZONE HIGH-DOSE 0.5 ML IM SUSY
0.5000 mL | PREFILLED_SYRINGE | Freq: Once | INTRAMUSCULAR | 0 refills | Status: AC
  Filled 2023-10-04: qty 0.5, 1d supply, fill #0

## 2023-10-08 DIAGNOSIS — M47816 Spondylosis without myelopathy or radiculopathy, lumbar region: Secondary | ICD-10-CM | POA: Insufficient documentation

## 2023-10-17 ENCOUNTER — Other Ambulatory Visit: Payer: Self-pay | Admitting: Family Medicine

## 2023-10-17 DIAGNOSIS — Z1231 Encounter for screening mammogram for malignant neoplasm of breast: Secondary | ICD-10-CM

## 2023-10-24 ENCOUNTER — Ambulatory Visit (HOSPITAL_COMMUNITY)
Admission: RE | Admit: 2023-10-24 | Discharge: 2023-10-24 | Disposition: A | Discharge status: Home / Self Care | Source: Ambulatory Visit | Attending: Cardiology | Admitting: Cardiology

## 2023-10-24 DIAGNOSIS — R0602 Shortness of breath: Secondary | ICD-10-CM | POA: Diagnosis present

## 2023-10-24 LAB — ECHOCARDIOGRAM COMPLETE
Area-P 1/2: 2.58 cm2
S' Lateral: 2.55 cm

## 2023-10-28 NOTE — Telephone Encounter (Signed)
 Called to give the results/information below, no answer, Left message and call back number.    Croitoru, Jerel, MD to Me (Selected Message)    10/25/23 12:20 PM Result Note No evidence of congestive heart failure.  The heart pumping strength is normal (at the lower limits of normal due to left bundle branch block, which is a longstanding finding).  Although there are some mild abnormalities in heart relaxation, the filling pressures are normal, so there is no evidence for a cardiac reason for shortness of breath.   The ascending aorta is mildly dilated at 4.4 cm.  This is a very slight change from 5 years earlier in 2020 when it was 4.2 cm in diameter.  At some point, probably in a years time, we will measure this more accurately with a CT scan, but we generally do not worry about the risk of aneurysm rupture unless the aorta exceeds 5.0 cm in diameter or if it is growing fast (which it clearly is not).

## 2023-10-31 ENCOUNTER — Other Ambulatory Visit: Payer: Self-pay | Admitting: Cardiovascular Disease

## 2023-11-04 NOTE — Telephone Encounter (Signed)
 Pt of Dr. Francyne. Does Dr. Francyne want to refill this RX? Please advise.

## 2023-11-05 ENCOUNTER — Ambulatory Visit
Admission: RE | Admit: 2023-11-05 | Discharge: 2023-11-05 | Disposition: A | Source: Ambulatory Visit | Attending: Family Medicine | Admitting: Family Medicine

## 2023-11-05 DIAGNOSIS — Z1231 Encounter for screening mammogram for malignant neoplasm of breast: Secondary | ICD-10-CM

## 2023-11-07 ENCOUNTER — Ambulatory Visit: Payer: Self-pay | Admitting: Family Medicine

## 2023-11-07 NOTE — Telephone Encounter (Signed)
 Left message and call back number. Results mailed

## 2023-11-19 ENCOUNTER — Other Ambulatory Visit: Payer: Self-pay

## 2023-11-19 ENCOUNTER — Telehealth: Payer: Self-pay | Admitting: Family Medicine

## 2023-11-19 ENCOUNTER — Telehealth (HOSPITAL_BASED_OUTPATIENT_CLINIC_OR_DEPARTMENT_OTHER): Payer: Self-pay

## 2023-11-19 NOTE — Telephone Encounter (Signed)
 [facility] faxed  Surgical Clearance, to be filled out by provider. Patient requested to send it back via Fax within ASAP. Document is located in providers tray at front office.Please advise at [phone].

## 2023-11-19 NOTE — Telephone Encounter (Signed)
   Pre-operative Risk Assessment    Patient Name: Monica Harvey  DOB: [birth year] MRN: [id number]   Date of last office visit: 09/12/23 with Dr. Francyne Date of next office visit: NA   Request for Surgical Clearance    Procedure:  Right Total Knee Arthroplasty  Date of Surgery:  Clearance 02/25/24                                  Surgeon:  Dr. Donnice Car Surgeon's Group or Practice Name:  [facility] Phone number:  [phone] - [place] Sic Fax number:  [phone]   Type of Clearance Requested:   - Medical  - Pharmacy:  Hold Apixaban  (Eliquis ) not indicated   Type of Anesthesia:  Spinal   Additional requests/questions:    Bonney Augustin JONETTA Delores   11/19/2023, 12:12 PM

## 2023-11-19 NOTE — Telephone Encounter (Signed)
 Rec'd and given to Dr Wendolyn kroner

## 2023-11-25 ENCOUNTER — Encounter: Payer: Self-pay | Admitting: Family Medicine

## 2023-11-25 ENCOUNTER — Ambulatory Visit (INDEPENDENT_AMBULATORY_CARE_PROVIDER_SITE_OTHER): Admitting: Family Medicine

## 2023-11-25 VITALS — BP 122/76 | HR 72 | Temp 97.3°F | Ht 62.0 in | Wt 154.2 lb

## 2023-11-25 DIAGNOSIS — L989 Disorder of the skin and subcutaneous tissue, unspecified: Secondary | ICD-10-CM

## 2023-11-25 DIAGNOSIS — Z01818 Encounter for other preprocedural examination: Secondary | ICD-10-CM

## 2023-11-25 DIAGNOSIS — I482 Chronic atrial fibrillation, unspecified: Secondary | ICD-10-CM | POA: Diagnosis not present

## 2023-11-25 DIAGNOSIS — M1611 Unilateral primary osteoarthritis, right hip: Secondary | ICD-10-CM

## 2023-11-25 DIAGNOSIS — I110 Hypertensive heart disease with heart failure: Secondary | ICD-10-CM

## 2023-11-25 DIAGNOSIS — I447 Left bundle-branch block, unspecified: Secondary | ICD-10-CM | POA: Diagnosis not present

## 2023-11-25 DIAGNOSIS — I5042 Chronic combined systolic (congestive) and diastolic (congestive) heart failure: Secondary | ICD-10-CM

## 2023-11-25 DIAGNOSIS — H6121 Impacted cerumen, right ear: Secondary | ICD-10-CM

## 2023-11-25 DIAGNOSIS — I10 Essential (primary) hypertension: Secondary | ICD-10-CM

## 2023-11-25 DIAGNOSIS — I4819 Other persistent atrial fibrillation: Secondary | ICD-10-CM

## 2023-11-25 DIAGNOSIS — Z950 Presence of cardiac pacemaker: Secondary | ICD-10-CM

## 2023-11-25 NOTE — Progress Notes (Signed)
 New Patient Office Visit  Subjective:  Patient ID: Monica Harvey, female    DOB: 04/08/1940  Age: 83 y.o. MRN: [id number]  CC:  Chief Complaint  Patient presents with   Hip Pain    PT needs to be cleared for hip surgery on Right    Discussed the use of AI scribe software for clinical note transcription with the patient, who gave verbal consent to proceed.  History of Present Illness Monica Harvey is an 83 year old female with h/o congestive heart failure-due to tachycardia related cardiomyopathy,  and atrial fibrillation who presents for pre-operative evaluation for right hip surgery. New pt but seen acutely in past  She is scheduled for right hip surgery in February. She reports chronic right hip pain that began a few weeks after her pacemaker replacement in March 2025. The pain has progressively worsened, significantly limiting her mobility and requiring frequent rest. She reports that she is unable to perform activities she previously enjoyed because of her hip pain.  Her medical history includes diastolic congestive heart failure, atrial fibrillation, and a left bundle branch block. She has had two pacemakers due to bradycardia and denies any history of myocardial infarction. Her current medications include amiodarone , diltiazem , Eliquis , furosemide , irbesartan , potassium, and pitavastatin . Amiodarone  has caused neuropathy, for which she takes gabapentin . She denies high blood pressure until she was placed on a heart medication years ago, which she believes caused it. Surgical history includes a hysterectomy for bleeding in the 1980s, a tubal ligation in 1971, and an appendectomy. She has had a right breast biopsy which was benign and a recent echocardiogram.  She experiences ringing in her right ear, which she finds annoying and believes affects her hearing. Occasional diplopia is noted, which she associates with certain antibiotics and sometimes occurs during church services. No  headaches, dizziness, or syncope are reported. Has seen ophth  Her family history includes heart disease in her mother and maternal grandfather, and high blood pressure on her father's side. She does not smoke, drink, or use drugs. She is married to Monica Harvey and has one child.  She reports a persistent red spot on her nose for about a year, initially attributed to her glasses, but she is concerned it may be skin cancer. No chest pain, palpitations, or dyspnea in general, though she experienced bronchitis recently. She is unable to exercise due to hip pain but denies any breathing difficulties or chest pain with exertion. She is concerned about the use of narcotics post-surgery due to her medication restrictions and potential side effects.     Current Outpatient Medications:    acetaminophen  (TYLENOL ) 500 MG tablet, Take 500 mg by mouth daily as needed for moderate pain or headache., Disp: , Rfl:    amiodarone  (PACERONE ) 100 MG tablet, Take 100 mg by mouth daily., Disp: , Rfl:    diltiazem  (CARDIZEM  CD) 120 MG 24 hr capsule, Take 1 capsule (120 mg total) by mouth daily., Disp: 90 capsule, Rfl: 3   ELIQUIS  2.5 MG TABS tablet, TAKE 1 TABLET TWICE A DAY, Disp: 180 tablet, Rfl: 1   furosemide  (LASIX ) 20 MG tablet, TAKE 1 TABLET BY MOUTH EVERY OTHER DAY *MAY TAKE EXTRA FOR WEIGHT GAIN OF 5LB IN 1 WEEK, Disp: 90 tablet, Rfl: 1   gabapentin  (NEURONTIN ) 100 MG capsule, Take 1 capsule (100 mg total) by mouth every morning AND 2 capsules (200 mg total) at bedtime., Disp: 270 capsule, Rfl: 3   irbesartan  (AVAPRO ) 150 MG tablet, TAKE  1 TABLET DAILY, Disp: 90 tablet, Rfl: 3   Pitavastatin  Calcium  1 MG TABS, TAKE 1 TABLET DAILY, Disp: 90 tablet, Rfl: 3   potassium chloride  (KLOR-CON ) 10 MEQ tablet, TAKE 1 TABLET EVERY OTHER  DAY, Disp: 45 tablet, Rfl: 3  Past Medical History:  Diagnosis Date   Allergy    Anginal pain    Chronic anticoagulation, CHAD2Vasc score 4 02/09/2014   Chronic diastolic CHF (congestive  heart failure) (HCC)    a. 01/2014 Echo: EF 50-55%, Gr1 DD, mild AI/MR.   Chronic kidney disease    First degree atrioventricular block    GERD (gastroesophageal reflux disease)    Heart murmur    HTN (hypertension)    Hyperlipidemia    LBBB (left bundle branch block)    Osteoporosis    Osteopenia   PAF (paroxysmal atrial fibrillation) (HCC)    a. Previously on flecainide ;  b. 01/2014 sotalol  loaded and dccv performed;  c. Chronic Savaysa  Rx.   Presence of permanent cardiac pacemaker    a. 12/26/12 s/p MDT DC PPM.   SSS (sick sinus syndrome) (HCC)    a. s/p PPM-Medtronic placed 12/26/12    Symptomatic sinus bradycardia     Past Surgical History:  Procedure Laterality Date   APPENDECTOMY  1971   BREAST EXCISIONAL BIOPSY Right    CARDIOVERSION N/A 01/22/2014   Procedure: CARDIOVERSION;  Surgeon: Vinie KYM Maxcy, MD;  Location: [facility] ENDOSCOPY;  Service: Cardiovascular;  Laterality: N/A;   CARDIOVERSION N/A 04/10/2017   Procedure: CARDIOVERSION;  Surgeon: Jeffrie Oneil BROCKS, MD;  Location: [facility] ENDOSCOPY;  Service: Cardiovascular;  Laterality: N/A;   CARDIOVERSION N/A 10/23/2019   Procedure: CARDIOVERSION;  Surgeon: Francyne Headland, MD;  Location: [facility] ENDOSCOPY;  Service: Cardiovascular;  Laterality: N/A;   INSERT / REPLACE / REMOVE PACEMAKER  12/26/2012   Medtronic, Dr. Francyne   NM MYOCAR PERF WALL MOTION  08/05/2009   small to mod. reversible anteroseptal,septal,& apical perfusion defect. EF 59%.   PERMANENT PACEMAKER INSERTION N/A 12/26/2012   Procedure: PERMANENT PACEMAKER INSERTION;  Surgeon: Headland Francyne, MD;  Location: [facility] CATH LAB;  Service: Cardiovascular;  Laterality: N/A;   PPM GENERATOR CHANGEOUT N/A 03/21/2023   Procedure: PPM GENERATOR CHANGEOUT;  Surgeon: Francyne Headland, MD;  Location: [facility] INVASIVE CV LAB;  Service: Cardiovascular;  Laterality: N/A;   TONSILLECTOMY AND ADENOIDECTOMY  1960's   TUBAL LIGATION  1971   VAGINAL HYSTERECTOMY  1980's   fibroids    Family  History  Problem Relation Age of Onset   Heart attack Mother    Hypertension Father    Stroke Sister    Heart attack Maternal Grandfather     Social History   Socioeconomic History   Marital status: Married    Spouse name: Publishing Copy   Number of children: 1   Years of education: Not on file   Highest education level: Not on file  Occupational History   Not on file  Tobacco Use   Smoking status: Never   Smokeless tobacco: Never  Vaping Use   Vaping status: Never Used  Substance and Sexual Activity   Alcohol use: No   Drug use: No   Sexual activity: Not Currently  Other Topics Concern   Not on file  Social History Narrative   ** Merged History Encounter **       Social Drivers of Health   Financial Resource Strain: Low Risk  (08/22/2023)   Overall Financial Resource Strain (CARDIA)    Difficulty of Paying Living Expenses: Not  hard at all  Food Insecurity: No Food Insecurity (08/22/2023)   Hunger Vital Sign    Worried About Running Out of Food in the Last Year: Never true    Ran Out of Food in the Last Year: Never true  Transportation Needs: No Transportation Needs (08/22/2023)   PRAPARE - Administrator, Civil Service (Medical): No    Lack of Transportation (Non-Medical): No  Physical Activity: Insufficiently Active (08/22/2023)   Exercise Vital Sign    Days of Exercise per Week: 5 days    Minutes of Exercise per Session: 10 min  Stress: No Stress Concern Present (08/22/2023)   [employer] of Occupational Health - Occupational Stress Questionnaire    Feeling of Stress: Not at all  Social Connections: Moderately Integrated (08/22/2023)   Social Connection and Isolation Panel    Frequency of Communication with Friends and Family: More than three times a week    Frequency of Social Gatherings with Friends and Family: More than three times a week    Attends Religious Services: More than 4 times per year    Active Member of [employer] or Organizations: No     Attends Banker Meetings: Never    Marital Status: Married  Catering Manager Violence: Not At Risk (08/22/2023)   Humiliation, Afraid, Rape, and Kick questionnaire    Fear of Current or Ex-Partner: No    Emotionally Abused: No    Physically Abused: No    Sexually Abused: No    ROS  ROS: Gen: no fever, chills  Skin: spot on nose >1 yr. ENT: no ear pain, ear drainage, nasal congestion, rhinorrhea, sinus pressure, sore throat.  Tinnitis R ear. Eyes: no blurry vision, double vision Resp: no cough, wheeze,SOB CV: no CP, palpitations, LE edema,  GI: no heartburn, n/v/d/c, abd pain GU: no dysuria, urgency, frequency, hematuria MSK: no joint pain, myalgias, back pain Neuro: no dizziness, headache, weakness, vertigo Psych: no depression, anxiety, insomnia, SI   Objective:   Today's Vitals: BP 122/76 (BP Location: Left Arm, Patient Position: Sitting, Cuff Size: Normal)   Pulse 72   Temp (!) 97.3 F (36.3 C) (Temporal)   Ht 5' 2 (1.575 m)   Wt 154 lb 4 oz (70 kg)   SpO2 98%   BMI 28.21 kg/m   Physical Exam  Gen: WDWN NAD HEENT: NCAT, conjunctiva not injected, sclera nonicteric TM WNL L, wax R, OP moist, no exudates  NECK:  supple, no thyromegaly, no nodes, no carotid bruits CARDIAC: RRR, S1S2+, no murmur. DP 2+B, + pacemaker on L LUNGS: CTAB. No wheezes ABDOMEN:  BS+, soft, NTND, No HSM, no masses EXT:  no edema MSK: no gross abnormalities.  NEURO: A&O x3.  CN II-XII intact.  PSYCH: normal mood. Good eye contact    Reviewed labs from Sept and Card notes  I personally spent a total of 40 minutes in the care of the patient today including preparing to see the patient, getting/reviewing separately obtained history, performing a medically appropriate exam/evaluation, counseling and educating, documenting clinical information in the EHR, and independently interpreting results.  Assessment & Plan:  Pre-operative clearance  Primary osteoarthritis of right  hip  LBBB (left bundle branch block)  Persistent atrial fibrillation (HCC)  Primary hypertension  Chronic combined systolic and diastolic CHF, NYHA class 1 (HCC)  Presence of cardiac pacemaker  Skin lesion -     Ambulatory referral to Dermatology  Impacted cerumen of right ear    Assessment and Plan Assessment &  Plan Right hip pain, planned for right hip surgery   Chronic right hip pain has worsened since March 2025 following pacemaker replacement, limiting mobility and daily activities. Surgery is scheduled for February. There are concerns about postoperative pain management and narcotic use. Non-narcotic pain management options need to be discussed with the surgeon. Tylenol  is safe.  Cannot take nsaids d/t eliquis . Surgical clearance from cardiology is needed. Medically, ok for surgery  Congestive heart failure   Chronic congestive heart failure is managed with furosemide . A recent echocardiogram was performed, and surgical clearance from cardiology is pending for the upcoming hip surgery.  Atrial fibrillation with pacemaker in situ   Chronic atrial fibrillation is managed with amiodarone , diltiazem , and Eliquis . A pacemaker is in place for bradycardia. A recent echocardiogram was performed, and surgical clearance from cardiology is pending for the upcoming hip surgery.  Skin lesion on nose, possible skin cancer, referred to dermatology   A shiny, umbilicated lesion on the nose has been present for approximately one year, with possible skin cancer suspected. She has been referred to dermatology for evaluation.  Tinnitus and hearing loss, right ear with cerumen impaction   Right ear tinnitus and hearing loss are likely due to cerumen impaction. No dizziness is reported, and wax is present in the right ear. Use Debrox ear drops for 5-6 days and schedule a follow-up appointment for ear irrigation.  Intermittent diplopia   Intermittent diplopia has occurred for years and is not  associated with any specific medication. No ocular disease was found on a recent eye examination. Symptoms improve with eye closure. Continue to monitor for changes in frequency or persistence.  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 Continue gabapentin  for neuropathic pain.      Follow-up: Return for 3-4 wks for ear R.  and cancel Feb appt.SABRA Jenkins CHRISTELLA Wendolyn, MD

## 2023-11-25 NOTE — Patient Instructions (Addendum)
 Welcome to [facility]! It was a pleasure meeting you today.  As discussed, Please schedule a 1 month follow up visit today.  Debrox in R ear daily for 5 days prior to appt  PLEASE NOTE:  If you had any LAB tests please let us  know if you have not heard back within a few days. You may see your results on MyChart before we have a chance to review them but we will give you a call once they are reviewed by us . If we ordered any REFERRALS today, please let us  know if you have not heard from their office within the next week.  Let us  know through MyChart if you are needing REFILLS, or have your pharmacy send us  the request. You can also use MyChart to communicate with me or any office staff.  Please try these tips to maintain a healthy lifestyle:  Eat most of your calories during the day when you are active. Eliminate processed foods including packaged sweets (pies, cakes, cookies), reduce intake of potatoes, white bread, white pasta, and white rice. Look for whole grain options, oat flour or almond flour.  Each meal should contain half fruits/vegetables, one quarter protein, and one quarter carbs (no bigger than a computer mouse).  Cut down on sweet beverages. This includes juice, soda, and sweet tea. Also watch fruit intake, though this is a healthier sweet option, it still contains natural sugar! Limit to 3 servings daily.  Drink at least 1 glass of water with each meal and aim for at least 8 glasses per day  Exercise at least 150 minutes every week.

## 2023-11-28 ENCOUNTER — Other Ambulatory Visit: Payer: Self-pay | Admitting: Cardiovascular Disease

## 2023-11-28 DIAGNOSIS — I4819 Other persistent atrial fibrillation: Secondary | ICD-10-CM

## 2023-12-02 NOTE — Telephone Encounter (Signed)
 Prescription refill request for Eliquis  received. Indication: a-fib Last office visit: 09/12/2023 Scr: 1.72 (EPIC 09/12/2023) Age: 83 Weight: 70kg  Dose appropriate, refilled

## 2023-12-02 NOTE — Telephone Encounter (Signed)
 Please refill the amiodarone  as 100 mg daily (half of a 200 mg tablet daily). Defer the gabapentin  refill to her PCP please

## 2023-12-03 MED ORDER — AMIODARONE HCL 200 MG PO TABS: 100.0000 mg | ORAL_TABLET | Freq: Every day | ORAL | 3 refills | Status: DC

## 2023-12-03 NOTE — Telephone Encounter (Signed)
   Name: Monica Harvey  DOB: 08-Sep-1940  MRN: [id number]  Primary Cardiologist: Jerel Balding, MD  Chart reviewed as part of pre-operative protocol coverage. Because of [name] past medical history and time since last visit, she will require a follow-up in-office visit in order to better assess preoperative cardiovascular risk.She is due for 3 months follow-up in 12/2023.   Pre-op covering staff: - Please schedule appointment and call patient to inform them. If patient already had an upcoming appointment within acceptable timeframe, please add pre-op clearance to the appointment notes so provider is aware. - Please contact requesting surgeon's office via preferred method (i.e, phone, fax) to inform them of need for appointment prior to surgery.  This message will also be routed to pharmacy pool for input on holding Eliquis  as requested below so that this information is available to the clearing provider at time of patient's appointment.   Damien JAYSON Braver, NP  12/03/2023, 10:33 AM

## 2023-12-03 NOTE — Telephone Encounter (Signed)
 CORRECTION ON CLEARANCE REQUEST ENTERED INTO CHART; WRONG PROCEDURE NOTED.    CORRECT PROCEDURE IS RIGHT TOTAL HIP ARTHROPLASTY; ANTERIOR APPROACH

## 2023-12-03 NOTE — Telephone Encounter (Signed)
 Pt has been scheduled in office preop appt 01/25/24 with Josefa Beauvais, FNP.

## 2023-12-04 NOTE — Telephone Encounter (Signed)
 Patient with diagnosis of atrial fibrillation on Eliquis  for anticoagulation.    Procedure: RIGHT TOTAL HIP ARTHROPLASTY; ANTERIOR APPROACH  Date of procedure: 02/25/24   CHA2DS2-VASc Score = 5   This indicates a 7.2% annual risk of stroke. The patient's score is based upon: CHF History: 1 HTN History: 1 Diabetes History: 0 Stroke History: 0 Vascular Disease History: 0 Age Score: 2 Gender Score: 1    CrCl 28 Platelet count 283  Patient has not had an Afib/aflutter ablation in the last 3 months, DCCV within the last 4 weeks or a watchman implanted in the last 45 days   Per office protocol, patient can hold Eliquis  for 3 days prior to procedure.   Patient will not need bridging with Lovenox (enoxaparin) around procedure.  **This guidance is not considered finalized until pre-operative APP has relayed final recommendations.**

## 2023-12-16 ENCOUNTER — Telehealth: Payer: Self-pay | Admitting: Cardiovascular Disease

## 2023-12-16 MED ORDER — AMIODARONE HCL 200 MG PO TABS: 100.0000 mg | ORAL_TABLET | Freq: Every day | ORAL | 3 refills | Status: DC

## 2023-12-16 NOTE — Telephone Encounter (Signed)
Refill sent to the pharmacy electronically.  

## 2023-12-16 NOTE — Telephone Encounter (Signed)
 gabapentin  will need to be refilled by PCP or medical doctor, i will send amiodarone  in now, can you ask what pharmacy -Adrien Conquest, RN  refill has been sent -Adrien Conquest, RN

## 2023-12-16 NOTE — Addendum Note (Signed)
 Addended by: DARIO IZETTA CROME on: 12/16/2023 02:45 PM   Modules accepted: Orders

## 2023-12-16 NOTE — Telephone Encounter (Signed)
 Pt came in stating her provider denied prescription refills for gabapentin  & amiodarone  & she's supposed to be on both. Patient is sitting in zone A. Please advise.

## 2023-12-16 NOTE — Addendum Note (Signed)
 Addended by: RICHIE ADRIEN ORN on: 12/16/2023 12:13 PM   Modules accepted: Orders

## 2023-12-18 ENCOUNTER — Encounter: Payer: Self-pay | Admitting: Family Medicine

## 2023-12-18 ENCOUNTER — Ambulatory Visit (INDEPENDENT_AMBULATORY_CARE_PROVIDER_SITE_OTHER): Admitting: Family Medicine

## 2023-12-18 VITALS — BP 122/80 | HR 86 | Temp 97.2°F | Ht 62.0 in | Wt 152.2 lb

## 2023-12-18 DIAGNOSIS — R748 Abnormal levels of other serum enzymes: Secondary | ICD-10-CM | POA: Diagnosis not present

## 2023-12-18 DIAGNOSIS — H6121 Impacted cerumen, right ear: Secondary | ICD-10-CM | POA: Diagnosis not present

## 2023-12-18 DIAGNOSIS — G609 Hereditary and idiopathic neuropathy, unspecified: Secondary | ICD-10-CM | POA: Insufficient documentation

## 2023-12-18 LAB — VITAMIN B12: Vitamin B-12: 175 pg/mL — ABNORMAL LOW (ref 211–911)

## 2023-12-18 MED ORDER — GABAPENTIN 100 MG PO CAPS: ORAL_CAPSULE | ORAL | 3 refills | Status: AC

## 2023-12-18 NOTE — Progress Notes (Signed)
 Subjective:     Patient ID: Monica Harvey [place], female    DOB: 13-Jul-1940, 83 y.o.   MRN: [id number]  Chief Complaint  Patient presents with   Tinnitus    Pt has ringing in her ear and not as bad    Discussed the use of AI scribe software for clinical note transcription with the patient, who gave verbal consent to proceed.  History of Present Illness Monica Harvey is an 83 year old female with atrial fibrillation and neuropathy who presents for medication management and follow-up. Here for irrig R ear  She is currently on amiodarone  for atrial fibrillation and gabapentin  for neuropathy. She has encountered issues with getting new rx for these medications, particularly gabapentin , which she takes at a dose of 100 mg in the morning and 200 mg at night. This dosage effectively manages her symptoms.  Her neuropathy symptoms include stinging and burning in her feet and occasional numbness in her hands, which she associates with the use of amiodarone . These symptoms have worsened over time since starting the medication. Despite this, she finds the current treatment with gabapentin  to be manageable and not overly disruptive to her daily life.  She has a history of elevated alkaline phosphatase levels, but is unsure of the cause and has not had any liver ultrasounds in the past.  She also reports occasional use of Tylenol , which she tries to limit due to concerns about liver health.  She is scheduled for hip surgery on February 17th and plans to see her cardiologist a few weeks prior to the procedure. She also has an appointment in January for a spot on her nose, which is being monitored.  She experiences decreased hearing and tinnitus in her right ear, which she attributes to wax buildup. She has used Debrox for five days to address this issue. Here for ear irrigation    There are no preventive care reminders to display for this patient.  Past Medical History:  Diagnosis Date   Allergy     Anginal pain    Chronic anticoagulation, CHAD2Vasc score 4 02/09/2014   Chronic diastolic CHF (congestive heart failure) (HCC)    a. 01/2014 Echo: EF 50-55%, Gr1 DD, mild AI/MR.   Chronic kidney disease    First degree atrioventricular block    GERD (gastroesophageal reflux disease)    Heart murmur    HTN (hypertension)    Hyperlipidemia    LBBB (left bundle branch block)    Osteoporosis    Osteopenia   PAF (paroxysmal atrial fibrillation) (HCC)    a. Previously on flecainide ;  b. 01/2014 sotalol  loaded and dccv performed;  c. Chronic Savaysa  Rx.   Presence of permanent cardiac pacemaker    a. 12/26/12 s/p MDT DC PPM.   SSS (sick sinus syndrome) (HCC)    a. s/p PPM-Medtronic placed 12/26/12    Symptomatic sinus bradycardia     Past Surgical History:  Procedure Laterality Date   APPENDECTOMY  1971   BREAST EXCISIONAL BIOPSY Right    CARDIOVERSION N/A 01/22/2014   Procedure: CARDIOVERSION;  Surgeon: Vinie KYM Maxcy, MD;  Location: [facility] ENDOSCOPY;  Service: Cardiovascular;  Laterality: N/A;   CARDIOVERSION N/A 04/10/2017   Procedure: CARDIOVERSION;  Surgeon: Jeffrie Oneil BROCKS, MD;  Location: [facility] ENDOSCOPY;  Service: Cardiovascular;  Laterality: N/A;   CARDIOVERSION N/A 10/23/2019   Procedure: CARDIOVERSION;  Surgeon: Francyne Headland, MD;  Location: [facility] ENDOSCOPY;  Service: Cardiovascular;  Laterality: N/A;   INSERT / REPLACE / REMOVE PACEMAKER  12/26/2012   Medtronic, Dr. Francyne   NM MYOCAR PERF WALL MOTION  08/05/2009   small to mod. reversible anteroseptal,septal,& apical perfusion defect. EF 59%.   PERMANENT PACEMAKER INSERTION N/A 12/26/2012   Procedure: PERMANENT PACEMAKER INSERTION;  Surgeon: Jerel Francyne, MD;  Location: [facility] CATH LAB;  Service: Cardiovascular;  Laterality: N/A;   PPM GENERATOR CHANGEOUT N/A 03/21/2023   Procedure: PPM GENERATOR CHANGEOUT;  Surgeon: Francyne Jerel, MD;  Location: [facility] INVASIVE CV LAB;  Service: Cardiovascular;  Laterality: N/A;   TONSILLECTOMY AND  ADENOIDECTOMY  1960's   TUBAL LIGATION  1971   VAGINAL HYSTERECTOMY  1980's   fibroids     Current Outpatient Medications:    acetaminophen  (TYLENOL ) 500 MG tablet, Take 500 mg by mouth daily as needed for moderate pain or headache., Disp: , Rfl:    amiodarone  (PACERONE ) 200 MG tablet, Take 0.5 tablets (100 mg total) by mouth daily., Disp: 45 tablet, Rfl: 3   diltiazem  (CARDIZEM  CD) 120 MG 24 hr capsule, Take 1 capsule (120 mg total) by mouth daily., Disp: 90 capsule, Rfl: 3   ELIQUIS  2.5 MG TABS tablet, TAKE 1 TABLET TWICE A DAY, Disp: 180 tablet, Rfl: 1   furosemide  (LASIX ) 20 MG tablet, TAKE 1 TABLET EVERY OTHER  DAY; MAY TAKE EXTRA FOR    WEIGHT GAIN OF 5POUNDS IN 1 WEEK, Disp: 58 tablet, Rfl: 2   irbesartan  (AVAPRO ) 150 MG tablet, TAKE 1 TABLET DAILY, Disp: 90 tablet, Rfl: 3   Pitavastatin  Calcium  1 MG TABS, TAKE 1 TABLET DAILY, Disp: 90 tablet, Rfl: 3   potassium chloride  (KLOR-CON ) 10 MEQ tablet, TAKE 1 TABLET EVERY OTHER  DAY, Disp: 45 tablet, Rfl: 3   gabapentin  (NEURONTIN ) 100 MG capsule, Take 1 capsule (100 mg total) by mouth every morning AND 2 capsules (200 mg total) at bedtime., Disp: 270 capsule, Rfl: 3  Allergies  Allergen Reactions   Azithromycin  Palpitations    Double vision   Codeine Nausea And Vomiting   ROS neg/noncontributory except as noted HPI/below      Objective:     BP 122/80 (BP Location: Left Arm, Patient Position: Sitting, Cuff Size: Normal)   Pulse 86   Temp (!) 97.2 F (36.2 C) (Temporal)   Ht 5' 2 (1.575 m)   Wt 152 lb 4 oz (69.1 kg)   SpO2 98%   BMI 27.85 kg/m  Wt Readings from Last 3 Encounters:  12/18/23 152 lb 4 oz (69.1 kg)  11/25/23 154 lb 4 oz (70 kg)  09/12/23 157 lb 6.4 oz (71.4 kg)    Physical Exam GENERAL: Well developed, well nourished, no acute distress. HEAD EYES EARS NOSE THROAT: Normocephalic, atraumatic, conjunctiva not injected, sclera nonicteric. Right tympanic membrane impacted with cerumen, irrigated with large  clump removed, tympanic membrane normal. MUSCULOSKELETAL: No gross abnormalities. NEUROLOGICAL: Alert and oriented x3, cranial nerves II through XII intact. PSYCHIATRIC: Normal mood, good eye contact. Procedure: Cerumen Disimpaction Verbal informed consent obtained from pt.  Hydrogen peroxide drops applied and gentle R ear lavage performed. There were no complications and following the disimpaction the tympanic membrane visible . Tympanic membranes are intact following the procedure.  Auditory canals are normal The patient reported relief of symptoms after removal of cerumen.  Pt tolerated well       Assessment & Plan:  Impacted cerumen of right ear  Idiopathic peripheral neuropathy -     Gabapentin ; Take 1 capsule (100 mg total) by mouth every morning AND 2 capsules (200 mg total) at  bedtime.  Dispense: 270 capsule; Refill: 3 -     Vitamin B12  Elevated serum alkaline phosphatase level -     Alkaline phosphatase, isoenzymes -     US  ABDOMEN LIMITED RUQ (LIVER/GB); Future    Assessment and Plan Assessment & Plan Idiopathic peripheral neuropathy   Chronic neuropathy presents with stinging and burning in the feet and occasional numbness in the hands. Gabapentin  effectively manages symptoms. Differential diagnosis includes diabetes, medication side effects, and vitamin B12 deficiency. Amiodarone 's role is uncertain. Renewed gabapentin  prescription for 90 days via mail order to [facility]. Ordered vitamin B12 level to evaluate for deficiency.  Elevated serum alkaline phosphatase   Recent blood work shows elevated alkaline phosphatase, possibly due to liver dysfunction or bone turnover. No prior liver ultrasound has been performed. Occasional Tylenol  use could contribute to liver enzyme elevation. Ordered liver ultrasound to evaluate for liver pathology. Scheduled ultrasound at the facility near her home.  Impacted cerumen, right ear   Right ear impacted with cerumen, causing decreased  hearing and tinnitus. Left ear is normal. Debrox was used for five days prior to the visit. Irrigated right ear to remove impacted cerumen. Relief of symptoms     Return for as sch in feb.  Jenkins CHRISTELLA Carrel, MD

## 2023-12-18 NOTE — Patient Instructions (Signed)
 Happy Holidays!!!!

## 2023-12-19 ENCOUNTER — Ambulatory Visit: Payer: Self-pay | Admitting: Family Medicine

## 2023-12-19 NOTE — Progress Notes (Signed)
 B12 is low-could contribute to neuropathy.  She can try OTC B12-higher dose one, or come in for injections-her choice(if shots, weekly x4 then monthly)

## 2023-12-21 LAB — ALKALINE PHOSPHATASE, ISOENZYMES
Alkaline Phosphatase: 182 IU/L — ABNORMAL HIGH (ref 48–129)
BONE FRACTION: 37 % (ref 14–68)
INTESTINAL FRAC.: 1 % (ref 0–18)
LIVER FRACTION: 62 % (ref 18–85)

## 2023-12-21 NOTE — Progress Notes (Signed)
 Alk phos still elevated but breakdown normal-await liver ultrasound

## 2023-12-23 NOTE — Progress Notes (Signed)
 Called pt left message Monica Harvey

## 2023-12-25 ENCOUNTER — Ambulatory Visit

## 2023-12-25 DIAGNOSIS — I4819 Other persistent atrial fibrillation: Secondary | ICD-10-CM | POA: Diagnosis not present

## 2023-12-26 LAB — CUP PACEART REMOTE DEVICE CHECK
Battery Remaining Longevity: 149 mo
Battery Voltage: 3.14 V
Brady Statistic AP VP Percent: 0.05 %
Brady Statistic AP VS Percent: 99.6 %
Brady Statistic AS VP Percent: 0 %
Brady Statistic AS VS Percent: 0.35 %
Brady Statistic RA Percent Paced: 99.64 %
Brady Statistic RV Percent Paced: 0.05 %
Date Time Interrogation Session: 20251216222224
Implantable Lead Connection Status: 753985
Implantable Lead Connection Status: 753985
Implantable Lead Implant Date: 20141219
Implantable Lead Implant Date: 20141219
Implantable Lead Location: 753859
Implantable Lead Location: 753860
Implantable Lead Model: 5076
Implantable Lead Model: 5076
Implantable Pulse Generator Implant Date: 20250313
Lead Channel Impedance Value: 323 Ohm
Lead Channel Impedance Value: 323 Ohm
Lead Channel Impedance Value: 361 Ohm
Lead Channel Impedance Value: 380 Ohm
Lead Channel Pacing Threshold Amplitude: 0.75 V
Lead Channel Pacing Threshold Amplitude: 1.5 V
Lead Channel Pacing Threshold Pulse Width: 0.4 ms
Lead Channel Pacing Threshold Pulse Width: 0.4 ms
Lead Channel Sensing Intrinsic Amplitude: 5.125 mV
Lead Channel Sensing Intrinsic Amplitude: 5.125 mV
Lead Channel Sensing Intrinsic Amplitude: 6 mV
Lead Channel Sensing Intrinsic Amplitude: 6 mV
Lead Channel Setting Pacing Amplitude: 1.5 V
Lead Channel Setting Pacing Amplitude: 3 V
Lead Channel Setting Pacing Pulse Width: 0.4 ms
Lead Channel Setting Sensing Sensitivity: 1.2 mV
Zone Setting Status: 755011

## 2023-12-26 NOTE — Progress Notes (Signed)
 Remote PPM Transmission

## 2023-12-30 ENCOUNTER — Inpatient Hospital Stay: Admission: RE | Admit: 2023-12-30 | Discharge: 2023-12-30 | Attending: Family Medicine | Admitting: Family Medicine

## 2023-12-30 DIAGNOSIS — R748 Abnormal levels of other serum enzymes: Secondary | ICD-10-CM

## 2023-12-31 NOTE — Progress Notes (Signed)
 Liver u/s ok.  Will monitor What did she decide on the B12?

## 2024-01-01 NOTE — Progress Notes (Signed)
 Called pt and notified.

## 2024-01-04 ENCOUNTER — Ambulatory Visit: Payer: Self-pay | Admitting: Cardiovascular Disease

## 2024-01-16 ENCOUNTER — Ambulatory Visit (HOSPITAL_COMMUNITY)
Admission: RE | Admit: 2024-01-16 | Discharge: 2024-01-16 | Disposition: A | Discharge status: Home / Self Care | Source: Ambulatory Visit | Attending: Cardiovascular Disease | Admitting: Cardiovascular Disease

## 2024-01-16 ENCOUNTER — Telehealth: Payer: Self-pay

## 2024-01-16 DIAGNOSIS — I4819 Other persistent atrial fibrillation: Secondary | ICD-10-CM

## 2024-01-16 DIAGNOSIS — R059 Cough, unspecified: Secondary | ICD-10-CM | POA: Insufficient documentation

## 2024-01-16 NOTE — Telephone Encounter (Signed)
 Per Dr. Francyne orders placed for Chest x-ray, PFT, and EP referral. Medication change noted (stop Amiodarone ).

## 2024-01-21 NOTE — Progress Notes (Unsigned)
 "   Cardiology Clinic Note   Patient Name: Monica Harvey Date of Encounter: 01/22/2024  Primary Care Provider:  Wendolyn Jenkins Jansky, MD Primary Cardiologist:  Jerel Balding, MD  Patient Profile    Monica Harvey 84 year old female presents to the clinic today for follow-up evaluation of her LBBB, chronic combined systolic and diastolic CHF, and preoperative cardiac evaluation.  Past Medical History    Past Medical History:  Diagnosis Date   Allergy    Anginal pain    Chronic anticoagulation, CHAD2Vasc score 4 02/09/2014   Chronic diastolic CHF (congestive heart failure) (HCC)    a. 01/2014 Echo: EF 50-55%, Gr1 DD, mild AI/MR.   Chronic kidney disease    First degree atrioventricular block    GERD (gastroesophageal reflux disease)    Heart murmur    HTN (hypertension)    Hyperlipidemia    LBBB (left bundle branch block)    Osteoporosis    Osteopenia   PAF (paroxysmal atrial fibrillation) (HCC)    a. Previously on flecainide ;  b. 01/2014 sotalol  loaded and dccv performed;  c. Chronic Savaysa  Rx.   Presence of permanent cardiac pacemaker    a. 12/26/12 s/p MDT DC PPM.   SSS (sick sinus syndrome) (HCC)    a. s/p PPM-Medtronic placed 12/26/12    Symptomatic sinus bradycardia    Past Surgical History:  Procedure Laterality Date   APPENDECTOMY  1971   BREAST EXCISIONAL BIOPSY Right    CARDIOVERSION N/A 01/22/2014   Procedure: CARDIOVERSION;  Surgeon: Vinie KYM Maxcy, MD;  Location: [facility] ENDOSCOPY;  Service: Cardiovascular;  Laterality: N/A;   CARDIOVERSION N/A 04/10/2017   Procedure: CARDIOVERSION;  Surgeon: Jeffrie Oneil BROCKS, MD;  Location: [facility] ENDOSCOPY;  Service: Cardiovascular;  Laterality: N/A;   CARDIOVERSION N/A 10/23/2019   Procedure: CARDIOVERSION;  Surgeon: Balding Jerel, MD;  Location: [facility] ENDOSCOPY;  Service: Cardiovascular;  Laterality: N/A;   INSERT / REPLACE / REMOVE PACEMAKER  12/26/2012   Medtronic, Dr. Balding   NM MYOCAR PERF WALL MOTION  08/05/2009   small  to mod. reversible anteroseptal,septal,& apical perfusion defect. EF 59%.   PERMANENT PACEMAKER INSERTION N/A 12/26/2012   Procedure: PERMANENT PACEMAKER INSERTION;  Surgeon: Jerel Balding, MD;  Location: [facility] CATH LAB;  Service: Cardiovascular;  Laterality: N/A;   PPM GENERATOR CHANGEOUT N/A 03/21/2023   Procedure: PPM GENERATOR CHANGEOUT;  Surgeon: Balding Jerel, MD;  Location: [facility] INVASIVE CV LAB;  Service: Cardiovascular;  Laterality: N/A;   TONSILLECTOMY AND ADENOIDECTOMY  1960's   TUBAL LIGATION  1971   VAGINAL HYSTERECTOMY  1980's   fibroids    Allergies  Allergies[1]  History of Present Illness    Monica Harvey has a PMH of HTN, left bundle branch block, first-degree atrioventricular block, tachybradycardia syndrome, chronic combined systolic and diastolic CHF, sick sinus syndrome, persistent atrial fibrillation, lumbar spondylosis, chronic renal insufficiency stage III, hyperlipidemia, and underwent generator change out on 03/21/2023 by Dr. Balding.  Echocardiogram 10/24/2023 showed an LVEF of 50-55%, mild LVH, G1 DD, moderate tricuspid valve regurgitation, and mild dilation of ascending aorta measuring 44 mm.  She had been on sotalol  for several years.  She reported peak breakthrough events and was switched to amiodarone .  She denied significant recurrent atrial fibrillation since she underwent cardioversion 10/21.  She was seen in follow-up by Dr. Balding on 09/12/2023.  During that time she reported fatigue.  She reported that she would tire easily.  She denied dyspnea, dizziness and syncope.  She denied temperature intolerances.  Her  TSH was normal 8/24.  Her pacemaker interrogation showed normal device function.  She was noted to have a paced and V sensed rhythm with 99.5% atrial pacing.  She was not noted to require ventricular pacing.  Lead parameters were appropriate.  She was noted to have steady activity level since device implantation.  (3.5 hours/day).  She presents to  the clinic today for evaluation and states she is feeling better with being off of amiodarone .  She reports that she no longer has a cough and notices that her congestion has gone away.  She reports that she had congestion for months following her generator change out.  We reviewed her upcoming surgery and her prior clinic visit.  She expressed understanding.  Her blood pressure today initially is 140/78 and on recheck is 142/78.  I will increase her irbesartan  to 225 mg daily and repeat a BMP in 1 week.  We will plan follow-up in 3 to 4 months.  Today she denies chest pain, shortness of breath, lower extremity edema, fatigue, palpitations, melena, hematuria, hemoptysis, diaphoresis, weakness, presyncope, syncope, orthopnea, and PND.     Home Medications    Prior to Admission medications  Medication Sig Start Date End Date Taking? Authorizing Provider  acetaminophen  (TYLENOL ) 500 MG tablet Take 500 mg by mouth daily as needed for moderate pain or headache.    [provider]  diltiazem  (CARDIZEM  CD) 120 MG 24 hr capsule Take 1 capsule (120 mg total) by mouth daily. 08/19/23   Croitoru, Mihai, MD  ELIQUIS  2.5 MG TABS tablet TAKE 1 TABLET TWICE A DAY 12/02/23   Croitoru, Mihai, MD  furosemide  (LASIX ) 20 MG tablet TAKE 1 TABLET EVERY OTHER  DAY; MAY TAKE EXTRA FOR    WEIGHT GAIN OF 5POUNDS IN 1 WEEK 12/02/23   Croitoru, Mihai, MD  gabapentin  (NEURONTIN ) 100 MG capsule Take 1 capsule (100 mg total) by mouth every morning AND 2 capsules (200 mg total) at bedtime. 12/18/23   Wendolyn Jenkins Jansky, MD  irbesartan  (AVAPRO ) 150 MG tablet TAKE 1 TABLET DAILY 11/04/23   Croitoru, Mihai, MD  Pitavastatin  Calcium  1 MG TABS TAKE 1 TABLET DAILY 11/04/23   Croitoru, Mihai, MD  potassium chloride  (KLOR-CON ) 10 MEQ tablet TAKE 1 TABLET EVERY OTHER  DAY 11/04/23   Croitoru, Mihai, MD    Family History    Family History  Problem Relation Age of Onset   Heart attack Mother    Hypertension Father    Stroke  Sister    Heart attack Maternal Grandfather    She indicated that her mother is deceased. She indicated that her father is deceased. She indicated that two of her three sisters are deceased. She indicated that her brother is alive. She indicated that her maternal grandmother is deceased. She indicated that her maternal grandfather is deceased. She indicated that her paternal grandmother is deceased. She indicated that her paternal grandfather is deceased.  Social History    Social History   Socioeconomic History   Marital status: Married    Spouse name: Glean   Number of children: 1   Years of education: Not on file   Highest education level: Not on file  Occupational History   Not on file  Tobacco Use   Smoking status: Never   Smokeless tobacco: Never  Vaping Use   Vaping status: Never Used  Substance and Sexual Activity   Alcohol use: No   Drug use: No   Sexual activity: Not Currently  Other Topics Concern  Not on file  Social History Narrative   ** Merged History Encounter **       Social Drivers of Health   Tobacco Use: Low Risk (01/22/2024)   Patient History    Smoking Tobacco Use: Never    Smokeless Tobacco Use: Never    Passive Exposure: Not on file  Financial Resource Strain: Low Risk (08/22/2023)   Overall Financial Resource Strain (CARDIA)    Difficulty of Paying Living Expenses: Not hard at all  Food Insecurity: No Food Insecurity (08/22/2023)   Epic    Worried About Programme Researcher, Broadcasting/film/video in the Last Year: Never true    Ran Out of Food in the Last Year: Never true  Transportation Needs: No Transportation Needs (08/22/2023)   Epic    Lack of Transportation (Medical): No    Lack of Transportation (Non-Medical): No  Physical Activity: Insufficiently Active (08/22/2023)   Exercise Vital Sign    Days of Exercise per Week: 5 days    Minutes of Exercise per Session: 10 min  Stress: No Stress Concern Present (08/22/2023)   [employer] of Occupational Health -  Occupational Stress Questionnaire    Feeling of Stress: Not at all  Social Connections: Moderately Integrated (08/22/2023)   Social Connection and Isolation Panel    Frequency of Communication with Friends and Family: More than three times a week    Frequency of Social Gatherings with Friends and Family: More than three times a week    Attends Religious Services: More than 4 times per year    Active Member of [employer] or Organizations: No    Attends Banker Meetings: Never    Marital Status: Married  Catering Manager Violence: Not At Risk (08/22/2023)   Epic    Fear of Current or Ex-Partner: No    Emotionally Abused: No    Physically Abused: No    Sexually Abused: No  Depression (PHQ2-9): Low Risk (08/22/2023)   Depression (PHQ2-9)    PHQ-2 Score: 0  Alcohol Screen: Low Risk (08/22/2023)   Alcohol Screen    Last Alcohol Screening Score (AUDIT): 0  Housing: Unknown (08/22/2023)   Epic    Unable to Pay for Housing in the Last Year: No    Number of Times Moved in the Last Year: Not on file    Homeless in the Last Year: No  Utilities: Not At Risk (08/22/2023)   Epic    Threatened with loss of utilities: No  Health Literacy: Adequate Health Literacy (08/22/2023)   B1300 Health Literacy    Frequency of need for help with medical instructions: Never     Review of Systems    General:  No chills, fever, night sweats or weight changes.  Cardiovascular:  No chest pain, dyspnea on exertion, edema, orthopnea, palpitations, paroxysmal nocturnal dyspnea. Dermatological: No rash, lesions/masses Respiratory: No cough, dyspnea Urologic: No hematuria, dysuria Abdominal:   No nausea, vomiting, diarrhea, bright red blood per rectum, melena, or hematemesis Neurologic:  No visual changes, wkns, changes in mental status. All other systems reviewed and are otherwise negative except as noted above.  Physical Exam    VS:  BP (!) 142/78   Pulse 75   Ht 5' 2 (1.575 m)   Wt 154 lb (69.9 kg)    SpO2 97%   BMI 28.17 kg/m  , BMI Body mass index is 28.17 kg/m. GEN: Well nourished, well developed, in no acute distress. HEENT: normal. Neck: Supple, no JVD, carotid bruits, or masses. Cardiac: RRR,  no murmurs, rubs, or gallops. No clubbing, cyanosis, edema.  Radials/DP/PT 2+ and equal bilaterally.  Respiratory:  Respirations regular and unlabored, clear to auscultation bilaterally. GI: Soft, nontender, nondistended, BS + x 4. MS: no deformity or atrophy. Skin: warm and dry, no rash. Neuro:  Strength and sensation are intact. Psych: Normal affect.  Accessory Clinical Findings    Recent Labs: 02/26/2023: Hemoglobin 13.3; Platelets 283 09/12/2023: ALT 14; BNP 47.5; BUN 28; Creatinine, Ser 1.72; Potassium 4.7; Sodium 142; TSH 2.570   Recent Lipid Panel    Component Value Date/Time   CHOL 188 09/12/2023 1304   TRIG 187 (H) 09/12/2023 1304   HDL 66 09/12/2023 1304   CHOLHDL 2.8 09/12/2023 1304   CHOLHDL 2.5 09/01/2015 0954   VLDL 25 09/01/2015 0954   LDLCALC 91 09/12/2023 1304    HYPERTENSION CONTROL Vitals:   01/22/24 1123 01/22/24 1147  BP: (!) 140/78 (!) 142/78    The patient's blood pressure is elevated above target today.  In order to address the patient's elevated BP: Blood pressure will be monitored at home to determine if medication changes need to be made.; A current anti-hypertensive medication was adjusted today.       ECG personally reviewed by me today- EKG Interpretation Date/Time:  Wednesday January 22 2024 11:18:39 EST Ventricular Rate:  75 PR Interval:  224 QRS Duration:  148 QT Interval:  446 QTC Calculation: 498 R Axis:   -7  Text Interpretation: Atrial-paced rhythm with prolonged AV conduction Left bundle branch block When compared with ECG of 12-Sep-2023 11:27, No significant change was found Confirmed by Emelia Hazy [phone]) on 01/22/2024 11:20:38 AM    Echocardiogram 10/24/2023  IMPRESSIONS      1. Left ventricular ejection fraction,  by estimation, is 50 to 55%. The left ventricle has low normal function. The left ventricle has no regional wall motion abnormalities. There is mild concentric left ventricular hypertrophy. Left ventricular  diastolic parameters are consistent with Grade I diastolic dysfunction (impaired relaxation). The average left ventricular global longitudinal strain is -18.9 %. The global longitudinal strain is normal.  2. Right ventricular systolic function is normal. The right ventricular size is normal.  3. The mitral valve is normal in structure. No evidence of mitral valve regurgitation. No evidence of mitral stenosis.  4. Tricuspid valve regurgitation is moderate.  5. The aortic valve is tricuspid. Aortic valve regurgitation is moderate. Aortic valve sclerosis is present, with no evidence of aortic valve stenosis.  6. Aortic dilatation noted. There is mild dilatation of the ascending aorta, measuring 44 mm.  7. The inferior vena cava is normal in size with greater than 50% respiratory variability, suggesting right atrial pressure of 3 mmHg.   FINDINGS  Left Ventricle: Left ventricular ejection fraction, by estimation, is 50 to 55%. The left ventricle has low normal function. The left ventricle has no regional wall motion abnormalities. The average left ventricular global longitudinal strain is -18.9  %. Strain was performed and the global longitudinal strain is normal. The left ventricular internal cavity size was normal in size. There is mild concentric left ventricular hypertrophy. Abnormal (paradoxical) septal motion, consistent with left bundle  branch block. Left ventricular diastolic parameters are consistent with Grade I diastolic dysfunction (impaired relaxation).   Right Ventricle: The right ventricular size is normal. No increase in right ventricular wall thickness. Right ventricular systolic function is normal.   Left Atrium: Left atrial size was normal in size.   Right Atrium: Right atrial  size was normal  in size.   Pericardium: There is no evidence of pericardial effusion.   Mitral Valve: The mitral valve is normal in structure. No evidence of mitral valve regurgitation. No evidence of mitral valve stenosis.   Tricuspid Valve: The tricuspid valve is normal in structure. Tricuspid valve regurgitation is moderate . No evidence of tricuspid stenosis.   Aortic Valve: The aortic valve is tricuspid. Aortic valve regurgitation is moderate. Aortic valve sclerosis is present, with no evidence of aortic valve stenosis.   Pulmonic Valve: The pulmonic valve was normal in structure. Pulmonic valve regurgitation is not visualized. No evidence of pulmonic stenosis.   Aorta: Aortic dilatation noted. There is mild dilatation of the ascending aorta, measuring 44 mm.   Venous: The inferior vena cava is normal in size with greater than 50% respiratory variability, suggesting right atrial pressure of 3 mmHg.   IAS/Shunts: No atrial level shunt detected by color flow Doppler.   Assessment & Plan   1.  Chronic diastolic CHF-continues to be euvolemic.  Continues to be somewhat physically active.  No increased DOE.  Reassuring echocardiogram 10/25. Heart healthy low-sodium diet Continue current medical therapy  Atrial fibrillation-continues to have atrially paced and V sensed rhythm.  EKG today shows a paced rhythm with prolonged AV conduction left bundle branch block 75 bpm unchanged from previous.  Amiodarone  discontinued by Dr. Francyne on 01/16/2024.  She was also referred to EP. Avoid triggers caffeine, chocolate, EtOH, dehydration excetra. Continue diltiazem , apixaban   Essential hypertension-BP today 140/78. Maintain blood pressure log Heart healthy low-sodium diet Increase irbesartan  to 225 mg daily (1-1/2 tablets) BMP in 1 week  Hyperlipidemia-LDL 91 on 09/12/23. High-fiber diet Continue pitavastatin  Maintain physical activity   Preoperative cardiac evaluation-right total hip  arthroplasty anterior approach, Dr. Donnice Car, [facility], fax #[phone]    Primary Cardiologist: Jerel Francyne, MD  Chart reviewed as part of pre-operative protocol coverage. Given past medical history and time since last visit, based on ACC/AHA guidelines, BREON DISS would be at acceptable risk for the planned procedure without further cardiovascular testing.   Her RCRI is low risk, 0.9% risk of major cardiac event.  She is able to complete greater than 4 METS of physical activity.  Patient was advised that if she develops new symptoms prior to surgery to contact our office to arrange a follow-up appointment.  She verbalized understanding.  Per office protocol, patient can hold Eliquis  for 3 days prior to procedure.   Patient will not need bridging with Lovenox (enoxaparin) around procedure.  I will route this recommendation to the requesting party via Epic fax function and remove from pre-op pool.  Patient will also need recommendations related to her permanent cardiac pacemaker.  Note sent to Dr. Francyne for PPM recommendations.  Disposition: Follow-up with Dr. Francyne in 3-4 months.   Monica Harvey. Johnpatrick Jenny NP-C     01/22/2024, 11:47 AM [facility] [address] [place], [place] [address] Office [phone]    Notice: This dictation was prepared with Dragon dictation along with smaller phrase technology. Any transcriptional errors that result from this process are unintentional and may not be corrected upon review.   I spent 14 minutes examining this patient, reviewing medications, and using patient centered shared decision making involving their cardiac care.   I spent  20 minutes reviewing past medical history,  medications, and prior cardiac tests.     [1]  Allergies Allergen Reactions   Azithromycin  Palpitations    Double vision  Codeine Nausea And Vomiting   "

## 2024-01-22 ENCOUNTER — Encounter: Payer: Self-pay | Admitting: General Practice

## 2024-01-22 ENCOUNTER — Ambulatory Visit: Attending: Cardiology | Admitting: General Practice

## 2024-01-22 VITALS — BP 142/78 | HR 75 | Ht 62.0 in | Wt 154.0 lb

## 2024-01-22 DIAGNOSIS — E7800 Pure hypercholesterolemia, unspecified: Secondary | ICD-10-CM | POA: Insufficient documentation

## 2024-01-22 DIAGNOSIS — I4819 Other persistent atrial fibrillation: Secondary | ICD-10-CM | POA: Diagnosis present

## 2024-01-22 DIAGNOSIS — I5032 Chronic diastolic (congestive) heart failure: Secondary | ICD-10-CM | POA: Insufficient documentation

## 2024-01-22 DIAGNOSIS — I10 Essential (primary) hypertension: Secondary | ICD-10-CM | POA: Insufficient documentation

## 2024-01-22 DIAGNOSIS — Z01810 Encounter for preprocedural cardiovascular examination: Secondary | ICD-10-CM | POA: Insufficient documentation

## 2024-01-22 DIAGNOSIS — Z79899 Other long term (current) drug therapy: Secondary | ICD-10-CM | POA: Diagnosis not present

## 2024-01-22 MED ORDER — IRBESARTAN 150 MG PO TABS: 225.0000 mg | ORAL_TABLET | Freq: Every day | ORAL | 2 refills | Status: AC

## 2024-01-22 NOTE — Patient Instructions (Signed)
 Medication Instructions:  INCREASE IRBESARTAN  TO ONE AND 1/2 TABLET (225 MG ) DAILY  *If you need a refill on your cardiac medications before your next appointment, please call your pharmacy*  Lab Work: BMP IN 1 WEEK If you have labs (blood work) drawn today and your tests are completely normal, you will receive your results only by: MyChart Message (if you have MyChart) OR A paper copy in the mail If you have any lab test that is abnormal or we need to change your treatment, we will call you to review the results.  Testing/Procedures: NO TESTING  Follow-Up: At [facility], you and your health needs are our priority.  As part of our continuing mission to provide you with exceptional heart care, our providers are all part of one team.  This team includes your primary Cardiologist (physician) and Advanced Practice Providers or APPs (Physician Assistants and Nurse Practitioners) who all work together to provide you with the care you need, when you need it.  Your next appointment:   3-4 month(s)  Provider:   Jerel Balding, MD or Josefa Beauvais, NP   We recommend signing up for the patient portal called MyChart.  Sign up information is provided on this After Visit Summary.  MyChart is used to connect with patients for Virtual Visits (Telemedicine).  Patients are able to view lab/test results, encounter notes, upcoming appointments, etc.  Non-urgent messages can be sent to your provider as well.   To learn more about what you can do with MyChart, go to [email or web address].   Other Instructions 3 DAYS PRIOR TO SURGERY STOP ELIQUIS 

## 2024-01-24 ENCOUNTER — Ambulatory Visit: Payer: Self-pay | Admitting: Cardiovascular Disease

## 2024-01-24 NOTE — Telephone Encounter (Signed)
 She reports that the cough is much better, pretty much gone. She is asking if she still needs the pulmonary functions testing.   She also reports that she has not received a call to get scheduled with Dr Almetta for a consult. Referral placed 01/16/24.  Informed her that I would send a message asking that she be contacted to schedule consult. She verbalized understanding.

## 2024-01-26 NOTE — Progress Notes (Signed)
 She is not device dependent and surgery is infradiaphragmatic. No adjustments necessary

## 2024-01-28 NOTE — Telephone Encounter (Signed)
 Spoke w/ patient - she is scheduled for consultation with Dr. Almetta on 3/2, she is on his cancellation list although having hip replacement on 2/17.

## 2024-01-29 LAB — BASIC METABOLIC PANEL WITH GFR
BUN/Creatinine Ratio: 13 (ref 12–28)
BUN: 28 mg/dL — ABNORMAL HIGH (ref 8–27)
CO2: 21 mmol/L (ref 20–29)
Calcium: 9.7 mg/dL (ref 8.7–10.3)
Chloride: 104 mmol/L (ref 96–106)
Creatinine, Ser: 2.1 mg/dL — ABNORMAL HIGH (ref 0.57–1.00)
Glucose: 105 mg/dL — ABNORMAL HIGH (ref 70–99)
Potassium: 4.6 mmol/L (ref 3.5–5.2)
Sodium: 141 mmol/L (ref 134–144)
eGFR: 23 mL/min/1.73 — ABNORMAL LOW

## 2024-01-29 NOTE — Telephone Encounter (Signed)
 Informed the patient that she does not need to get PFT's. She verbalized understanding.

## 2024-01-30 ENCOUNTER — Ambulatory Visit: Payer: Self-pay | Admitting: General Practice

## 2024-02-12 ENCOUNTER — Ambulatory Visit: Admitting: Dermatology

## 2024-02-12 ENCOUNTER — Ambulatory Visit: Admitting: Family Medicine

## 2024-02-13 ENCOUNTER — Encounter: Payer: Self-pay | Admitting: Cardiovascular Disease

## 2024-02-13 NOTE — Patient Instructions (Addendum)
 SURGICAL WAITING ROOM VISITATION Patients having surgery or a procedure may have no more than 2 support people in the waiting area - these visitors may rotate in the visitor waiting room.   Due to an increase in RSV and influenza rates and associated hospitalizations, children ages 7 and under may not visit patients in [facility] hospitals. If the patient needs to stay at the hospital during part of their recovery, the visitor guidelines for inpatient rooms apply.  PRE-OP VISITATION  Pre-op nurse will coordinate an appropriate time for 1 support person to accompany the patient in pre-op.  This support person may not rotate.  This visitor will be contacted when the time is appropriate for the visitor to come back in the pre-op area.  Please refer to the [facility] website for the visitor guidelines for Inpatients (after your surgery is over and you are in a regular room).  You are not required to quarantine at this time prior to your surgery. However, you must do this: Hand Hygiene often Do NOT share personal items Notify your provider if you are in close contact with someone who has COVID or you develop fever 100.4 or greater, new onset of sneezing, cough, sore throat, shortness of breath or body aches.  If you test positive for Covid or have been in contact with anyone that has tested positive in the last 10 days please notify you surgeon.    Your procedure is scheduled on:  02/25/24  Report to [facility] Main Entrance: [place] entrance where the [employer] is available.   Report to admitting at: 9:00 AM  Call this number if you have any questions or problems the morning of surgery [phone]  FOLLOW ANY ADDITIONAL PRE OP INSTRUCTIONS YOU RECEIVED FROM YOUR SURGEON'S OFFICE!!!  Do not eat food after Midnight the night prior to your surgery/procedure.  After Midnight you may have the following liquids until: 8:30 AM DAY OF SURGERY  Clear Liquid Diet Water Black  Coffee (sugar ok, NO MILK/CREAM OR CREAMERS)  Tea (sugar ok, NO MILK/CREAM OR CREAMERS) regular and decaf                             Plain Jell-O  with no fruit (NO RED)                                           Fruit ices (not with fruit pulp, NO RED)                                     Popsicles (NO RED)                                                                  Juice: NO CITRUS JUICES: only apple, WHITE grape, WHITE cranberry Sports drinks like Gatorade or Powerade (NO RED)   The day of surgery:  Drink ONE (1) Pre-Surgery Clear Ensure at : 8:30 AM the morning of surgery. Drink in one sitting. Do not sip.  This drink was given  to you during your hospital pre-op appointment visit. Nothing else to drink after completing the Pre-Surgery Clear Ensure or G2 : No candy, chewing gum or throat lozenges.    Oral Hygiene is also important to reduce your risk of infection.        Remember - BRUSH YOUR TEETH THE MORNING OF SURGERY WITH YOUR REGULAR TOOTHPASTE  Do NOT smoke after Midnight the night before surgery.  STOP TAKING all Vitamins, Herbs and supplements 1 week before your surgery.   Take ONLY these medicines the morning of surgery with A SIP OF WATER: gabapentin ,diltiazem .Tylenol  as needed.                  You may not have any metal on your body including hair pins, jewelry, and body piercing  Do not wear make-up, lotions, powders, perfumes / cologne, or deodorant  Do not wear nail polish including gel and S&S, artificial / acrylic nails, or any other type of covering on natural nails including finger and toenails. If you have artificial nails, gel coating, etc., that needs to be removed by a nail salon, Please have this removed prior to surgery. Not doing so may mean that your surgery could be cancelled or delayed if the Surgeon or anesthesia staff feels like they are unable to monitor you safely.   Do not shave 48 hours prior to surgery to avoid nicks in your skin which may  contribute to postoperative infections.   Contacts, Hearing Aids, dentures or bridgework may not be worn into surgery. DENTURES WILL BE REMOVED PRIOR TO SURGERY PLEASE DO NOT APPLY Poly grip OR ADHESIVES!!!  You may bring a small overnight bag with you on the day of surgery, only pack items that are not valuable.  IS NOT RESPONSIBLE   FOR VALUABLES THAT ARE LOST OR STOLEN.   Patients discharged on the day of surgery will not be allowed to drive home.  Someone NEEDS to stay with you for the first 24 hours after anesthesia.  Do not bring your home medications to the hospital. The Pharmacy will dispense medications listed on your medication list to you during your admission in the Hospital.  Special Instructions: Bring a copy of your healthcare power of attorney and living will documents the day of surgery, if you wish to have them scanned into your [facility] Records- EPIC  Please read over the following fact sheets you were given: IF YOU HAVE QUESTIONS ABOUT YOUR PRE-OP INSTRUCTIONS, PLEASE CALL [phone]  PATIENT SIGNATURE_________________________________  NURSE SIGNATURE__________________________________  ________________________________________________________________________  Pre-operative 4 CHG Bath Instructions  DYNA-Hex 4 Chlorhexidine  Gluconate 4% Solution Antiseptic 4 fl. oz   You can play a key role in reducing the risk of infection after surgery. Your skin needs to be as free of germs as possible. You can reduce the number of germs on your skin by washing with CHG (chlorhexidine  gluconate) soap before surgery. CHG is an antiseptic soap that kills germs and continues to kill germs even after washing.   DO NOT use if you have an allergy to chlorhexidine /CHG or antibacterial soaps. If your skin becomes reddened or irritated, stop using the CHG and notify one of our RNs at   Please shower with the CHG soap starting 4 days before surgery using the following  schedule:     Please keep in mind the following:  DO NOT shave, including legs and underarms, starting the day of your first shower.   You may shave your face at any  point before/day of surgery.  Place clean sheets on your bed the day you start using CHG soap. Use a clean washcloth (not used since being washed) for each shower. DO NOT sleep with pets once you start using the CHG.  CHG Shower Instructions:  If you choose to wash your hair and private area, wash first with your normal shampoo/soap.  After you use shampoo/soap, rinse your hair and body thoroughly to remove shampoo/soap residue.  Turn the water OFF and apply about 3 tablespoons (45 ml) of CHG soap to a CLEAN washcloth.  Apply CHG soap ONLY FROM YOUR NECK DOWN TO YOUR TOES (washing for 3-5 minutes)  DO NOT use CHG soap on face, private areas, open wounds, or sores.  Pay special attention to the area where your surgery is being performed.  If you are having back surgery, having someone wash your back for you may be helpful. Wait 2 minutes after CHG soap is applied, then you may rinse off the CHG soap.  Pat dry with a clean towel  Put on clean clothes/pajamas   If you choose to wear lotion, please use ONLY the CHG-compatible lotions on the back of this paper.     Additional instructions for the day of surgery: DO NOT APPLY any lotions, deodorants, cologne, or perfumes.   Put on clean/comfortable clothes.  Brush your teeth.  Ask your nurse before applying any prescription medications to the skin.   CHG Compatible Lotions   Aveeno Moisturizing lotion  Cetaphil Moisturizing Cream  Cetaphil Moisturizing Lotion  Clairol Herbal Essence Moisturizing Lotion, Dry Skin  Clairol Herbal Essence Moisturizing Lotion, Extra Dry Skin  Clairol Herbal Essence Moisturizing Lotion, Normal Skin  Curel Age Defying Therapeutic Moisturizing Lotion with Alpha Hydroxy  Curel Extreme Care Body Lotion  Curel Soothing Hands Moisturizing Hand  Lotion  Curel Therapeutic Moisturizing Cream, Fragrance-Free  Curel Therapeutic Moisturizing Lotion, Fragrance-Free  Curel Therapeutic Moisturizing Lotion, Original Formula  Eucerin Daily Replenishing Lotion  Eucerin Dry Skin Therapy Plus Alpha Hydroxy Crme  Eucerin Dry Skin Therapy Plus Alpha Hydroxy Lotion  Eucerin Original Crme  Eucerin Original Lotion  Eucerin Plus Crme Eucerin Plus Lotion  Eucerin TriLipid Replenishing Lotion  Keri Anti-Bacterial Hand Lotion  Keri Deep Conditioning Original Lotion Dry Skin Formula Softly Scented  Keri Deep Conditioning Original Lotion, Fragrance Free Sensitive Skin Formula  Keri Lotion Fast Absorbing Fragrance Free Sensitive Skin Formula  Keri Lotion Fast Absorbing Softly Scented Dry Skin Formula  Keri Original Lotion  Keri Skin Renewal Lotion Keri Silky Smooth Lotion  Keri Silky Smooth Sensitive Skin Lotion  Nivea Body Creamy Conditioning Oil  Nivea Body Extra Enriched Lotion  Nivea Body Original Lotion  Nivea Body Sheer Moisturizing Lotion Nivea Crme  Nivea Skin Firming Lotion  NutraDerm 30 Skin Lotion  NutraDerm Skin Lotion  NutraDerm Therapeutic Skin Cream  NutraDerm Therapeutic Skin Lotion  ProShield Protective Hand Cream  Provon moisturizing lotion  Incentive Spirometer  An incentive spirometer is a tool that can help keep your lungs clear and active. This tool measures how well you are filling your lungs with each breath. Taking long deep breaths may help reverse or decrease the chance of developing breathing (pulmonary) problems (especially infection) following: A long period of time when you are unable to move or be active. BEFORE THE PROCEDURE  If the spirometer includes an indicator to show your best effort, your nurse or respiratory therapist will set it to a desired goal. If possible, sit up straight or lean slightly  forward. Try not to slouch. Hold the incentive spirometer in an upright position. INSTRUCTIONS FOR USE   Sit on the edge of your bed if possible, or sit up as far as you can in bed or on a chair. Hold the incentive spirometer in an upright position. Breathe out normally. Place the mouthpiece in your mouth and seal your lips tightly around it. Breathe in slowly and as deeply as possible, raising the piston or the ball toward the top of the column. Hold your breath for 3-5 seconds or for as long as possible. Allow the piston or ball to fall to the bottom of the column. Remove the mouthpiece from your mouth and breathe out normally. Rest for a few seconds and repeat Steps 1 through 7 at least 10 times every 1-2 hours when you are awake. Take your time and take a few normal breaths between deep breaths. The spirometer may include an indicator to show your best effort. Use the indicator as a goal to work toward during each repetition. After each set of 10 deep breaths, practice coughing to be sure your lungs are clear. If you have an incision (the cut made at the time of surgery), support your incision when coughing by placing a pillow or rolled up towels firmly against it. Once you are able to get out of bed, walk around indoors and cough well. You may stop using the incentive spirometer when instructed by your caregiver.  RISKS AND COMPLICATIONS Take your time so you do not get dizzy or light-headed. If you are in pain, you may need to take or ask for pain medication before doing incentive spirometry. It is harder to take a deep breath if you are having pain. AFTER USE Rest and breathe slowly and easily. It can be helpful to keep track of a log of your progress. Your caregiver can provide you with a simple table to help with this. If you are using the spirometer at home, follow these instructions: SEEK MEDICAL CARE IF:  You are having difficultly using the spirometer. You have trouble using the spirometer as often as instructed. Your pain medication is not giving enough relief while using the  spirometer. You develop fever of 100.5 F (38.1 C) or higher. SEEK IMMEDIATE MEDICAL CARE IF:  You cough up bloody sputum that had not been present before. You develop fever of 102 F (38.9 C) or greater. You develop worsening pain at or near the incision site. MAKE SURE YOU:  Understand these instructions. Will watch your condition. Will get help right away if you are not doing well or get worse. Document Released: 05/07/2006 Document Revised: 03/19/2011 Document Reviewed: 07/08/2006 [facility] Patient Information 2014 [place], [place].   ________________________________________________________________________

## 2024-02-13 NOTE — Progress Notes (Signed)
 PERIOPERATIVE PRESCRIPTION FOR IMPLANTED CARDIAC DEVICE PROGRAMMING  Patient Information: Name:  Monica Harvey  DOB:  [birth year]  MRN:  [id number]   Planned Procedure:  ARTHROPLASTY, HIP, TOTAL, ANTERIOR APPROACH - Right  Surgeon:  Dr. Donnice Car  Date of Procedure:  02/25/24  Cautery will be used.  Position during surgery:    Please send documentation back to:  Darryle Law (Fax # [phone])   Device Information:  Clinic EP Physician:  Jerel Balding, MD   Device Type:  Pacemaker Manufacturer and Phone #:  [employer]: [id number] Pacemaker Dependent?:  Yes.   Date of Last Device Check:  12/24/23 Remote  09/12/2023 in office Normal Device Function?:  Yes.    Electrophysiologist's Recommendations:  Have magnet available. Provide continuous ECG monitoring when magnet is used or reprogramming is to be performed.  Procedure will likely interfere with device function.  Device should be programmed:  Asynchronous pacing during procedure and returned to normal programming after procedure  Per Device Clinic Standing Orders, Alan JAYSON Fees, RN  1:45 PM 02/13/2024

## 2024-02-14 ENCOUNTER — Encounter (HOSPITAL_COMMUNITY): Admission: RE | Admit: 2024-02-14

## 2024-02-14 ENCOUNTER — Other Ambulatory Visit: Payer: Self-pay

## 2024-02-14 ENCOUNTER — Encounter (HOSPITAL_COMMUNITY): Payer: Self-pay

## 2024-02-14 VITALS — BP 134/75 | HR 81 | Temp 98.5°F | Ht 62.0 in | Wt 154.0 lb

## 2024-02-14 DIAGNOSIS — Z01818 Encounter for other preprocedural examination: Secondary | ICD-10-CM

## 2024-02-14 DIAGNOSIS — M1611 Unilateral primary osteoarthritis, right hip: Secondary | ICD-10-CM

## 2024-02-14 DIAGNOSIS — I10 Essential (primary) hypertension: Secondary | ICD-10-CM

## 2024-02-14 HISTORY — DX: Unspecified osteoarthritis, unspecified site: M19.90

## 2024-02-14 LAB — CBC
HCT: 40.4 % (ref 36.0–46.0)
Hemoglobin: 12.7 g/dL (ref 12.0–15.0)
MCH: 31.1 pg (ref 26.0–34.0)
MCHC: 31.4 g/dL (ref 30.0–36.0)
MCV: 98.8 fL (ref 80.0–100.0)
Platelets: 311 10*3/uL (ref 150–400)
RBC: 4.09 MIL/uL (ref 3.87–5.11)
RDW: 13.4 % (ref 11.5–15.5)
WBC: 6.8 10*3/uL (ref 4.0–10.5)
nRBC: 0 % (ref 0.0–0.2)

## 2024-02-14 LAB — BASIC METABOLIC PANEL WITH GFR
Anion gap: 12 (ref 5–15)
BUN: 18 mg/dL (ref 8–23)
CO2: 23 mmol/L (ref 22–32)
Calcium: 9.9 mg/dL (ref 8.9–10.3)
Chloride: 107 mmol/L (ref 98–111)
Creatinine, Ser: 1.57 mg/dL — ABNORMAL HIGH (ref 0.44–1.00)
GFR, Estimated: 33 mL/min — ABNORMAL LOW
Glucose, Bld: 88 mg/dL (ref 70–99)
Potassium: 4.7 mmol/L (ref 3.5–5.1)
Sodium: 141 mmol/L (ref 135–145)

## 2024-02-14 LAB — TYPE AND SCREEN
ABO/RH(D): B POS
Antibody Screen: NEGATIVE

## 2024-02-14 LAB — SURGICAL PCR SCREEN
MRSA, PCR: NEGATIVE
Staphylococcus aureus: NEGATIVE

## 2024-02-14 NOTE — Progress Notes (Signed)
 For Anesthesia: PCP - Wendolyn Jenkins Jansky, MD  Cardiologist - Francyne Headland, MD  Clearance: 01/22/24: Josefa Beauvais: NP Bowel Prep reminder:N/A  Chest x-ray - 01/23/24 EKG - 01/22/24 Stress Test -  ECHO - 10/24/23 Cardiac Cath -  Pacemaker/ICD device last checked: 12/24/23 Pacemaker orders received: Yes Device Rep notified: Yes. Email was send.  Spinal Cord Stimulator:N/A  Sleep Study - N/A CPAP -   Fasting Blood Sugar - N/A Checks Blood Sugar _____ times a day Date and result of last Hgb A1c-  Last dose of GLP1 agonist- N/A GLP1 instructions: Hold 7 days prior to schedule (Hold 24 hours-daily)   Last dose of SGLT-2 inhibitors- N/A SGLT-2 instructions: Hold 72 hours prior to surgery  Blood Thinner Instructions: Eliquis  will be hold after: 02/21/24 Last Dose: Time last taken:  Aspirin  Instructions: Last Dose: Time last taken:  Activity level:    Unable to go up a flight of stairs without shortness of breath    Anesthesia review: Hx: HTN,CHF,Left BBB,PAF,CKD 3.  Patient denies shortness of breath, fever, cough and chest pain at PAT appointment   Patient verbalized understanding of instructions that were reviewed over the telephone.

## 2024-02-25 ENCOUNTER — Ambulatory Visit (HOSPITAL_COMMUNITY): Admission: RE | Admit: 2024-02-25 | Admitting: Orthopedic Surgery

## 2024-02-25 ENCOUNTER — Encounter (HOSPITAL_COMMUNITY): Admission: RE | Payer: Self-pay | Source: Home / Self Care

## 2024-02-25 DIAGNOSIS — M1611 Unilateral primary osteoarthritis, right hip: Secondary | ICD-10-CM

## 2024-02-25 SURGERY — ARTHROPLASTY, HIP, TOTAL, ANTERIOR APPROACH
Anesthesia: Spinal | Site: Hip | Laterality: Right

## 2024-03-09 ENCOUNTER — Ambulatory Visit: Admitting: Student in an Organized Health Care Education/Training Program

## 2024-03-19 ENCOUNTER — Ambulatory Visit: Admitting: Dermatology

## 2024-03-25 ENCOUNTER — Encounter

## 2024-05-13 ENCOUNTER — Ambulatory Visit: Admitting: Cardiovascular Disease

## 2024-06-24 ENCOUNTER — Encounter

## 2024-08-26 ENCOUNTER — Ambulatory Visit

## 2024-09-23 ENCOUNTER — Encounter

## 2024-12-23 ENCOUNTER — Encounter

## 2025-03-24 ENCOUNTER — Encounter
